# Patient Record
Sex: Male | Born: 1945 | Race: White | Hispanic: No | Marital: Married | Smoking: Never smoker
Health system: Southern US, Community
[De-identification: ages and names within clinical notes are randomized; demographics above are authoritative.]

## PROBLEM LIST (undated history)

## (undated) DIAGNOSIS — I714 Abdominal aortic aneurysm, without rupture, unspecified: Secondary | ICD-10-CM

## (undated) DIAGNOSIS — I459 Conduction disorder, unspecified: Secondary | ICD-10-CM

## (undated) DIAGNOSIS — I10 Essential (primary) hypertension: Secondary | ICD-10-CM

## (undated) DIAGNOSIS — I6521 Occlusion and stenosis of right carotid artery: Principal | ICD-10-CM

## (undated) DIAGNOSIS — M1990 Unspecified osteoarthritis, unspecified site: Secondary | ICD-10-CM

## (undated) DIAGNOSIS — I6529 Occlusion and stenosis of unspecified carotid artery: Secondary | ICD-10-CM

## (undated) DIAGNOSIS — E7800 Pure hypercholesterolemia, unspecified: Secondary | ICD-10-CM

## (undated) DIAGNOSIS — E119 Type 2 diabetes mellitus without complications: Secondary | ICD-10-CM

## (undated) DIAGNOSIS — I350 Nonrheumatic aortic (valve) stenosis: Secondary | ICD-10-CM

## (undated) HISTORY — DX: Abdominal aortic aneurysm, without rupture, unspecified: I71.40

## (undated) HISTORY — DX: Occlusion and stenosis of right carotid artery: I65.21

## (undated) HISTORY — PX: KNEE ARTHROSCOPY: SHX127

## (undated) HISTORY — DX: Nonrheumatic aortic (valve) stenosis: I35.0

## (undated) HISTORY — PX: CATARACT EXTRACTION: SUR2

## (undated) HISTORY — DX: Occlusion and stenosis of unspecified carotid artery: I65.29

## (undated) HISTORY — DX: Abdominal aortic aneurysm, without rupture: I71.4

## (undated) HISTORY — DX: Conduction disorder, unspecified: I45.9

---

## 1989-04-08 HISTORY — PX: KNEE ARTHROSCOPY: SHX127

## 2000-01-25 ENCOUNTER — Encounter: Payer: Self-pay | Admitting: Emergency Medicine

## 2000-01-25 ENCOUNTER — Inpatient Hospital Stay (HOSPITAL_COMMUNITY): Admission: EM | Admit: 2000-01-25 | Discharge: 2000-01-26 | Payer: Self-pay | Admitting: Emergency Medicine

## 2000-01-26 ENCOUNTER — Encounter: Payer: Self-pay | Admitting: Cardiology

## 2013-02-22 ENCOUNTER — Emergency Department (HOSPITAL_COMMUNITY): Payer: Medicare PPO

## 2013-02-22 ENCOUNTER — Encounter (HOSPITAL_COMMUNITY): Payer: Self-pay | Admitting: *Deleted

## 2013-02-22 ENCOUNTER — Observation Stay (HOSPITAL_COMMUNITY)
Admission: EM | Admit: 2013-02-22 | Discharge: 2013-02-23 | Disposition: A | Discharge status: Home / Self Care | Payer: Medicare PPO | Attending: Internal Medicine | Admitting: Internal Medicine

## 2013-02-22 DIAGNOSIS — E119 Type 2 diabetes mellitus without complications: Secondary | ICD-10-CM | POA: Insufficient documentation

## 2013-02-22 DIAGNOSIS — E1169 Type 2 diabetes mellitus with other specified complication: Secondary | ICD-10-CM | POA: Diagnosis present

## 2013-02-22 DIAGNOSIS — E785 Hyperlipidemia, unspecified: Secondary | ICD-10-CM

## 2013-02-22 DIAGNOSIS — E1159 Type 2 diabetes mellitus with other circulatory complications: Secondary | ICD-10-CM | POA: Diagnosis present

## 2013-02-22 DIAGNOSIS — Z79899 Other long term (current) drug therapy: Secondary | ICD-10-CM | POA: Insufficient documentation

## 2013-02-22 DIAGNOSIS — R079 Chest pain, unspecified: Principal | ICD-10-CM | POA: Insufficient documentation

## 2013-02-22 DIAGNOSIS — I10 Essential (primary) hypertension: Secondary | ICD-10-CM | POA: Insufficient documentation

## 2013-02-22 HISTORY — DX: Type 2 diabetes mellitus without complications: E11.9

## 2013-02-22 HISTORY — DX: Essential (primary) hypertension: I10

## 2013-02-22 HISTORY — DX: Pure hypercholesterolemia, unspecified: E78.00

## 2013-02-22 LAB — LIPID PANEL
HDL: 30 mg/dL — ABNORMAL LOW (ref >39)
LDL Cholesterol: 63 mg/dL (ref 0–99)
Triglycerides: 133 mg/dL (ref <150)
VLDL: 27 mg/dL (ref 0–40)

## 2013-02-22 LAB — BASIC METABOLIC PANEL
Chloride: 104 mEq/L (ref 96–112)
GFR calc Af Amer: >90 mL/min (ref >90)
GFR calc non Af Amer: >90 mL/min (ref >90)
Potassium: 3.4 mEq/L — ABNORMAL LOW (ref 3.5–5.1)
Sodium: 140 mEq/L (ref 135–145)

## 2013-02-22 LAB — COMPREHENSIVE METABOLIC PANEL
ALT: 27 U/L (ref 0–53)
AST: 21 U/L (ref 0–37)
CO2: 29 mEq/L (ref 19–32)
Calcium: 9.7 mg/dL (ref 8.4–10.5)
Chloride: 101 mEq/L (ref 96–112)
Creatinine, Ser: 0.63 mg/dL (ref 0.50–1.35)
GFR calc Af Amer: >90 mL/min (ref >90)
GFR calc non Af Amer: >90 mL/min (ref >90)
Glucose, Bld: 161 mg/dL — ABNORMAL HIGH (ref 70–99)
Sodium: 139 mEq/L (ref 135–145)
Total Bilirubin: 0.8 mg/dL (ref 0.3–1.2)

## 2013-02-22 LAB — CBC
Hemoglobin: 16.7 g/dL (ref 13.0–17.0)
MCH: 30.1 pg (ref 26.0–34.0)
MCV: 80.9 fL (ref 78.0–100.0)
Platelets: 133 10*3/uL — ABNORMAL LOW (ref 150–400)
Platelets: 139 10*3/uL — ABNORMAL LOW (ref 150–400)
RBC: 5.55 MIL/uL (ref 4.22–5.81)
RDW: 13.1 % (ref 11.5–15.5)
WBC: 5.5 10*3/uL (ref 4.0–10.5)
WBC: 7.3 10*3/uL (ref 4.0–10.5)

## 2013-02-22 LAB — POCT I-STAT TROPONIN I
Troponin i, poc: 0 ng/mL (ref 0.00–0.08)
Troponin i, poc: 0 ng/mL (ref 0.00–0.08)

## 2013-02-22 LAB — GLUCOSE, CAPILLARY: Glucose-Capillary: 181 mg/dL — ABNORMAL HIGH (ref 70–99)

## 2013-02-22 LAB — HEMOGLOBIN A1C: Hgb A1c MFr Bld: 6.8 % — ABNORMAL HIGH (ref <5.7)

## 2013-02-22 MED ORDER — ASPIRIN 81 MG PO CHEW
162.0000 mg | CHEWABLE_TABLET | Freq: Once | ORAL | Status: AC
  Administered 2013-02-22: 162 mg via ORAL
  Filled 2013-02-22: qty 2

## 2013-02-22 MED ORDER — INSULIN ASPART 100 UNIT/ML ~~LOC~~ SOLN
0.0000 [IU] | SUBCUTANEOUS | Status: DC
  Administered 2013-02-22: 2 [IU] via SUBCUTANEOUS

## 2013-02-22 MED ORDER — GLIMEPIRIDE 1 MG PO TABS
1.0000 mg | ORAL_TABLET | Freq: Every day | ORAL | Status: DC
  Administered 2013-02-23: 1 mg via ORAL
  Filled 2013-02-22 (×2): qty 1

## 2013-02-22 MED ORDER — ASPIRIN EC 81 MG PO TBEC
81.0000 mg | DELAYED_RELEASE_TABLET | Freq: Once | ORAL | Status: DC
  Filled 2013-02-22: qty 1

## 2013-02-22 MED ORDER — ENOXAPARIN SODIUM 40 MG/0.4ML ~~LOC~~ SOLN
40.0000 mg | SUBCUTANEOUS | Status: DC
  Administered 2013-02-22: 40 mg via SUBCUTANEOUS
  Filled 2013-02-22 (×2): qty 0.4

## 2013-02-22 MED ORDER — HYDROCHLOROTHIAZIDE 12.5 MG PO CAPS
12.5000 mg | ORAL_CAPSULE | Freq: Every day | ORAL | Status: DC
  Administered 2013-02-23: 12.5 mg via ORAL
  Filled 2013-02-22: qty 1

## 2013-02-22 MED ORDER — ASPIRIN EC 325 MG PO TBEC
325.0000 mg | DELAYED_RELEASE_TABLET | Freq: Every day | ORAL | Status: DC
  Administered 2013-02-23: 325 mg via ORAL
  Filled 2013-02-22: qty 1

## 2013-02-22 MED ORDER — SITAGLIPTIN PHOSPHATE 50 MG PO TABS
50.0000 mg | ORAL_TABLET | Freq: Two times a day (BID) | ORAL | Status: DC
  Administered 2013-02-23: 50 mg via ORAL
  Filled 2013-02-22 (×3): qty 1

## 2013-02-22 MED ORDER — SIMVASTATIN 10 MG PO TABS
10.0000 mg | ORAL_TABLET | Freq: Every day | ORAL | Status: DC
  Administered 2013-02-22: 10 mg via ORAL
  Filled 2013-02-22 (×2): qty 1

## 2013-02-22 MED ORDER — AMLODIPINE BESYLATE 10 MG PO TABS
10.0000 mg | ORAL_TABLET | Freq: Every day | ORAL | Status: DC
  Administered 2013-02-23: 10 mg via ORAL
  Filled 2013-02-22: qty 1

## 2013-02-22 MED ORDER — RAMIPRIL 10 MG PO TABS: 10.0000 mg | ORAL_TABLET | Freq: Every day | ORAL | Status: DC

## 2013-02-22 MED ORDER — SITAGLIPTIN PHOS-METFORMIN HCL 50-500 MG PO TABS
1.0000 | ORAL_TABLET | Freq: Two times a day (BID) | ORAL | Status: DC
  Filled 2013-02-22: qty 1

## 2013-02-22 MED ORDER — CALCIUM CARBONATE 600 MG PO TABS
600.0000 mg | ORAL_TABLET | Freq: Two times a day (BID) | ORAL | Status: DC
  Filled 2013-02-22: qty 1

## 2013-02-22 MED ORDER — CALCIUM CARBONATE 1250 (500 CA) MG PO TABS
1.0000 | ORAL_TABLET | Freq: Two times a day (BID) | ORAL | Status: DC
  Administered 2013-02-23: 500 mg via ORAL
  Filled 2013-02-22 (×3): qty 1

## 2013-02-22 MED ORDER — INSULIN ASPART 100 UNIT/ML ~~LOC~~ SOLN
0.0000 [IU] | Freq: Three times a day (TID) | SUBCUTANEOUS | Status: DC
Start: 2013-02-23 — End: 2013-02-23
  Administered 2013-02-23: 1 [IU] via SUBCUTANEOUS

## 2013-02-22 MED ORDER — RAMIPRIL 10 MG PO TABS
10.0000 mg | ORAL_TABLET | Freq: Two times a day (BID) | ORAL | Status: DC
  Administered 2013-02-22 – 2013-02-23 (×2): 10 mg via ORAL
  Filled 2013-02-22 (×3): qty 1

## 2013-02-22 MED ORDER — ASPIRIN EC 81 MG PO TBEC
81.0000 mg | DELAYED_RELEASE_TABLET | Freq: Every day | ORAL | Status: DC
  Filled 2013-02-22: qty 1

## 2013-02-22 MED ORDER — METFORMIN HCL 500 MG PO TABS
500.0000 mg | ORAL_TABLET | Freq: Two times a day (BID) | ORAL | Status: DC
  Administered 2013-02-23: 500 mg via ORAL
  Filled 2013-02-22 (×3): qty 1

## 2013-02-22 NOTE — Consult Note (Signed)
CARDIOLOGY CONSULT NOTE  Patient ID: Christopher Rasmussen MRN: [id number] DOB/AGE: 12-23-1945 67 y.o.  Admit date: 02/22/2013 Referring Physician   Primary Physician:  Aida Puffer, MD Reason for Consultation  Chest pain  HPI: Patient is a pleasant 67 year old Caucasian male with history of diabetes, hypertension, hyperlipidemia, who I have never seen in the past, however he was referred to me for a routine treadmill exercise stress test about 6 months ago, in which it was low risk, he also has had known carotid stenosis per carotid duplex. The carotid stenosis is moderate in the range of 50-69%. He had been doing well until this morning he started to notice pain below his left rib cage. It was described as mild, would worsen when he takes a deep breath. There was no other associated symptoms of nausea, vomiting, diaphoresis. No associated shortness breath. No history to suggest PND orthopnea. No history of lower extension edema or painful swelling of the lower extremity is, dizziness or hemoptysis or syncope. He denies symptoms to suggest claudication. His wife is present at the bedside.  Past Medical History  Diagnosis Date  . Diabetes mellitus without complication   . Hypertension   . High cholesterol      History reviewed. No pertinent family history. there is no history of abdominal aortic aneurysm, no history of premature coronary artery disease in the family.  Social History: History   Social History  . Marital Status: Married    Spouse Name: N/A    Number of Children: N/A  . Years of Education: N/A   Occupational History  . Not on file.   Social History Main Topics  . Smoking status: Never Smoker   . Smokeless tobacco: Not on file  . Alcohol Use: Not on file  . Drug Use: No  . Sexually Active: Not on file   Other Topics Concern  . Not on file   Social History Narrative  . No narrative on file     Prescriptions prior to admission  Medication Sig Dispense Refill  .  amLODipine (NORVASC) 10 MG tablet Take 10 mg by mouth daily.      Marland Kitchen aspirin 325 MG tablet Take 325 mg by mouth daily.      . calcium carbonate (OS-CAL) 600 MG TABS Take 600 mg by mouth 2 (two) times daily with a meal.      . glimepiride (AMARYL) 1 MG tablet Take 1 mg by mouth daily before breakfast.      . hydrochlorothiazide (MICROZIDE) 12.5 MG capsule Take 12.5 mg by mouth daily.      . ramipril (ALTACE) 10 MG tablet Take 10 mg by mouth daily.      . simvastatin (ZOCOR) 10 MG tablet Take 10 mg by mouth at bedtime.      . sitaGLIPtan-metformin (JANUMET) 50-500 MG per tablet Take 1 tablet by mouth 2 (two) times daily with a meal.      . [DISCONTINUED] ferrous gluconate (FERGON) 325 MG tablet Take 325 mg by mouth daily with breakfast.      . [DISCONTINUED] Naproxen Sodium (ALEVE PO) Take 2 tablets by mouth daily as needed (pain).        Scheduled Meds: . [START ON 02/23/2013] amLODipine  10 mg Oral Daily  . [START ON 02/23/2013] aspirin EC  325 mg Oral Daily  . [START ON 02/23/2013] calcium carbonate  1 tablet Oral BID WC  . enoxaparin (LOVENOX) injection  40 mg Subcutaneous Q24H  . [START ON 02/23/2013] glimepiride  1  mg Oral QAC breakfast  . [START ON 02/23/2013] hydrochlorothiazide  12.5 mg Oral Daily  . insulin aspart  0-9 Units Subcutaneous Q4H  . [START ON 02/23/2013] metFORMIN  500 mg Oral BID WC  . [START ON 02/23/2013] ramipril  10 mg Oral Daily  . simvastatin  10 mg Oral QHS  . [START ON 02/23/2013] sitaGLIPtin  50 mg Oral BID WC   Continuous Infusions:  PRN Meds:.  ROS: General: no fevers/chills/night sweats Eyes: no blurry vision, diplopia, or amaurosis ENT: no sore throat or hearing loss Resp: no cough, wheezing, or hemoptysis CV: no edema or palpitations GI: no abdominal pain, nausea, vomiting, diarrhea, or constipation GU: no dysuria, frequency, or hematuria Skin: no rash Neuro: no headache, numbness, tingling, or weakness of extremities Musculoskeletal: no joint pain or  swelling Heme: no bleeding, DVT, or easy bruising Endo: no polydipsia or polyuria    Physical Exam: Blood pressure 150/72, pulse 78, temperature 98.5 F (36.9 C), temperature source Oral, resp. rate 14, SpO2 96.00%.   General appearance: alert, cooperative, appears stated age, no distress and moderately obese Lungs: clear to auscultation bilaterally Chest wall: no tenderness Heart: S1 and S2 is normal. There is a 2/6 rough crescendo decrescendo systolic murmur in the apex and right sternal border. Abdomen: soft, non-tender; bowel sounds normal; no masses,  no organomegaly and Pannus present. Extremities: extremities normal, atraumatic, no cyanosis or edema Pulses: 2+ and symmetric there is bilateral carotid bruit. Neurologic: Grossly normal  Labs:   Lab Results  Component Value Date   WBC 5.5 02/22/2013   HGB 17.2* 02/22/2013   HCT 44.7 02/22/2013   MCV 81.3 02/22/2013   PLT 133* 02/22/2013    Recent Labs Lab 02/22/13 1134  NA 140  K 3.4*  CL 104  CO2 27  BUN 11  CREATININE 0.67  CALCIUM 9.7  GLUCOSE 136*   Lab Results  Component Value Date   TROPONINI <0.30 02/22/2013    Lipid Panel  No results found for this basename: chol, trig, hdl, cholhdl, vldl, ldlcalc    EKG: normal EKG, normal sinus rhythm, unchanged from previous tracings.    Radiology: Dg Chest 2 View  02/22/2013   *RADIOLOGY REPORT*  Clinical Data: Chest pain, history of hypertension and diabetes  CHEST - 2 VIEW  Comparison: None.  Findings:  Borderline enlarged cardiac silhouette and mediastinal contours possibly accentuated due to decreased lung volumes.  Minimal heterogeneous opacities within the medial aspect of the bilateral lungs, favored to represent atelectasis.  No focal airspace opacity.  No pleural effusion or pneumothorax.  No definite evidence of edema.  No acute osseous abnormalities.  IMPRESSION: Minimal perihilar atelectasis without acute cardiopulmonary disease.   Original Report  Authenticated By: Tacey Ruiz, MD    ASSESSMENT AND PLAN:  1. Musculoskeletal chest pain in a patient with multiple cardiovascular risk factors. Patient is extremely concerned due to his age and risk factors that he needed to be evaluated. 2. Hypertension 3. Hyperlipidemia 4. Diabetes mellitus type 2 controlled 5. Carotid artery stenosis of 50-69% outpatient carotid duplex done 3 months ago. 6. Abnormal heart sounds, suggest mild aortic stenosis.  Recommendation: Patient should be kept in for rule out of myocardial infarction/unstable angina although symptoms appear to be atypical. He is presently completely asymptomatic. I have advised him to walk around in the hallway and if no chest pain, cardiac markers were negative microinjury, he can be discharged home in the morning. I will contact him to schedule and see me  in the outpatient basis. I will set him up for nuclear stress test to improve the sensitivity and specificity of the stress test and also obtain outpatient echocardiogram. He'll need aggressive risk factor modification. I will check his lipid profile.  Pamella Pert, MD 02/22/2013, 6:22 PM [facility] Pager: [phone] Office: [phone] If no answer Cell [phone]

## 2013-02-22 NOTE — ED Provider Notes (Signed)
History    CSN: [id number] Arrival date & time 02/22/13  1034  First MD Initiated Contact with Patient 02/22/13 1145     Chief Complaint  Patient presents with  . Chest Pain    (Consider location/radiation/quality/duration/timing/severity/associated sxs/prior Treatment) HPI Comments: Mr. Helt is a 67 yo male with a PMH of HTN, HLD and DM here with complaint of L sided CP below his rib cage that began upon awaking this morning.  He says the pain is 1/10 and occurs with exhalation.  He denies chest pressure/tightness/SOB, radiation, diaphoresis or N/V.  He has no history of GERD or PUD and he denies trauma to his left side/chest.  He denies lower extremity edema.  He took two Aleve prior to coming to the ED and feels the pain has improved.  He also took his daily aspirin this morning.  He says he is compliant with his medications.  His family history is significant for MI in both his father and brother (father died at 72 from MI; brother with MI at age 67).  He reports having had a normal stress test four months ago.  Patient is a 67 y.o. male presenting with chest pain. The history is provided by the patient and the spouse. No language interpreter was used.  Chest Pain Pain location:  L lateral chest Pain quality: no pressure, not radiating and no tightness   Pain radiates to:  Does not radiate Pain radiates to the back: no   Pain severity:  Mild Timing:  Intermittent Progression:  Improving Chronicity:  New Context: breathing   Context: no drug use, not eating and no trauma   Relieved by:  Aspirin (and Aleve) Worsened by:  Deep breathing Associated symptoms: no abdominal pain, no cough, no diaphoresis, no fatigue, no fever, no headache, no lower extremity edema, no nausea, no shortness of breath and not vomiting   Risk factors: diabetes mellitus, hypertension and male sex   Risk factors: no smoking    Past Medical History  Diagnosis Date  . Diabetes mellitus without complication    . Hypertension   . High cholesterol    History reviewed. No pertinent past surgical history. History reviewed. No pertinent family history. History  Substance Use Topics  . Smoking status: Not on file  . Smokeless tobacco: Not on file  . Alcohol Use: No    Review of Systems  Constitutional: Negative for fever, diaphoresis and fatigue.  HENT: Negative for rhinorrhea and sneezing.   Eyes: Negative for visual disturbance.  Respiratory: Negative for cough, chest tightness and shortness of breath.   Cardiovascular: Positive for chest pain. Negative for leg swelling.  Gastrointestinal: Negative for nausea, vomiting and abdominal pain.  Endocrine: Negative for polyuria.  Genitourinary: Negative for dysuria.  Allergic/Immunologic: Negative for food allergies.  Neurological: Negative for headaches.    Allergies  Review of patient's allergies indicates no known allergies.  Home Medications   Current Outpatient Rx  Name  Route  Sig  Dispense  Refill  . amLODipine (NORVASC) 10 MG tablet   Oral   Take 10 mg by mouth daily.         Marland Kitchen aspirin 325 MG tablet   Oral   Take 325 mg by mouth daily.         . calcium carbonate (OS-CAL) 600 MG TABS   Oral   Take 600 mg by mouth 2 (two) times daily with a meal.         . ferrous gluconate (FERGON)  325 MG tablet   Oral   Take 325 mg by mouth daily with breakfast.         . glimepiride (AMARYL) 1 MG tablet   Oral   Take 1 mg by mouth daily before breakfast.         . hydrochlorothiazide (MICROZIDE) 12.5 MG capsule   Oral   Take 12.5 mg by mouth daily.         . Naproxen Sodium (ALEVE PO)   Oral   Take 2 tablets by mouth daily as needed (pain).         . ramipril (ALTACE) 10 MG tablet   Oral   Take 10 mg by mouth daily.         . simvastatin (ZOCOR) 10 MG tablet   Oral   Take 10 mg by mouth at bedtime.         . sitaGLIPtan-metformin (JANUMET) 50-500 MG per tablet   Oral   Take 1 tablet by mouth 2 (two)  times daily with a meal.          BP 181/73  Pulse 87  Temp(Src) 98.5 F (36.9 C) (Oral)  Resp 18  SpO2 96% Physical Exam  Constitutional: He is oriented to person, place, and time. He appears well-developed and well-nourished. No distress.  HENT:  Head: Normocephalic and atraumatic.  Mouth/Throat: Oropharynx is clear and moist.  Eyes: Conjunctivae and EOM are normal. Pupils are equal, round, and reactive to light. No scleral icterus.  Neck: Neck supple.  Cardiovascular: Normal rate, regular rhythm and normal heart sounds.  Exam reveals no gallop and no friction rub.   No murmur heard. Pulmonary/Chest: Effort normal and breath sounds normal. No respiratory distress. He has no wheezes. He has no rales.  Abdominal: Soft. Bowel sounds are normal. He exhibits no distension and no mass. There is no tenderness. There is no rebound and no guarding.  Musculoskeletal: Normal range of motion. He exhibits no edema and no tenderness.  Lymphadenopathy:    He has no cervical adenopathy.  Neurological: He is alert and oriented to person, place, and time. No cranial nerve deficit.  Skin: Skin is warm. He is not diaphoretic.  Psychiatric: He has a normal mood and affect. His behavior is normal.    ED Course  Procedures (including critical care time) Labs Reviewed  CBC  BASIC METABOLIC PANEL     Date: 02/22/2013  Rate: 84 bpm  Rhythm: normal sinus rhythm  QRS Axis: normal   Intervals: normal  ST/T Wave abnormalities: normal  Conduction Disutrbances:none  Narrative Interpretation:   Old EKG Reviewed: none available     Dg Chest 2 View  02/22/2013   *RADIOLOGY REPORT*  Clinical Data: Chest pain, history of hypertension and diabetes  CHEST - 2 VIEW  Comparison: None.  Findings:  Borderline enlarged cardiac silhouette and mediastinal contours possibly accentuated due to decreased lung volumes.  Minimal heterogeneous opacities within the medial aspect of the bilateral lungs, favored to  represent atelectasis.  No focal airspace opacity.  No pleural effusion or pneumothorax.  No definite evidence of edema.  No acute osseous abnormalities.  IMPRESSION: Minimal perihilar atelectasis without acute cardiopulmonary disease.   Original Report Authenticated By: Tacey Ruiz, MD   No diagnosis found.  MDM   Pt with resolving CP and  no evidence of ischemia on EKG.  Pt with no history of chest trauma or thoracic surgery.  Doubt PE as patient without SOB/tachycardia, without lower extremity edema.  Doubt PNA as pt without recent cough/URI symptoms.  Given his risk factors for CAD will admit and trend trops.  Istat trop negative.  ASA 162mg  given in ED.  Internal medicine consulted for admission.         Evelena Peat, DO 02/22/13 [phone]

## 2013-02-22 NOTE — ED Provider Notes (Signed)
History/physical exam/procedure(s) were performed by non-physician practitioner and as supervising physician I was immediately available for consultation/collaboration. I have reviewed all notes and am in agreement with care and plan.   Hilario Quarry, MD 02/22/13 [phone]

## 2013-02-22 NOTE — ED Notes (Signed)
Pt reports pain to left chest since this am, pain increases with breathing and movement, denies recent cough. No acute distress noted at triage, ekg being done.

## 2013-02-22 NOTE — H&P (Signed)
[facility] History and Physical  Christopher Rasmussen HQI:[id number] DOB: 14-Dec-1945 DOA: 02/22/2013  Referring physician:  PCP: Aida Puffer, MD  Specialists:   Chief Complaint: chest Pain  HPI: Christopher Rasmussen 67 yo       male with a PMH of HTN, HLD and DM here with complaint of L sided CP below his rib cage that began upon awaking this morning. He says the pain is 1/10 and occurs with exhalation. He denies chest pressure/tightness/SOB, radiation, diaphoresis or N/V. He has no history of GERD or PUD and he denies trauma to his left side/chest. He denies lower extremity edema. He took two Aleve prior to coming to the ED and feels the pain has improved. He also took his daily aspirin this morning. He says he is compliant with his medications. His family history is significant for MI in both his father and brother (father died at 60 from MI; brother with MI at age 41). He reports having had a normal stress test four months ago.  Heart Score of at least 4 admit for serial CE   Review of Systems: The patient denies anorexia, fever, weight loss,, vision loss, decreased hearing, hoarseness, chest pain, syncope, dyspnea on exertion, peripheral edema, balance deficits, hemoptysis, abdominal pain, melena, hematochezia, severe indigestion/heartburn, hematuria, incontinence, genital sores, muscle weakness, suspicious skin lesions, transient blindness, difficulty walking, depression, unusual weight change, abnormal bleeding, enlarged lymph nodes, angioedema, and breast masses.    Past Medical History  Diagnosis Date  . Diabetes mellitus without complication   . Hypertension   . High cholesterol    History reviewed. No pertinent past surgical history. Social History:  reports that he has never smoked. He does not have any smokeless tobacco history on file. He reports that he does not use illicit drugs. His alcohol history is not on file.  No Known Allergies  History reviewed. No pertinent family  history.   Prior to Admission medications   Medication Sig Start Date End Date Taking? Authorizing Provider  amLODipine (NORVASC) 10 MG tablet Take 10 mg by mouth daily.   Yes Historical Provider, MD  aspirin 325 MG tablet Take 325 mg by mouth daily.   Yes Historical Provider, MD  calcium carbonate (OS-CAL) 600 MG TABS Take 600 mg by mouth 2 (two) times daily with a meal.   Yes Historical Provider, MD  ferrous gluconate (FERGON) 325 MG tablet Take 325 mg by mouth daily with breakfast.   Yes Historical Provider, MD  glimepiride (AMARYL) 1 MG tablet Take 1 mg by mouth daily before breakfast.   Yes Historical Provider, MD  hydrochlorothiazide (MICROZIDE) 12.5 MG capsule Take 12.5 mg by mouth daily.   Yes Historical Provider, MD  Naproxen Sodium (ALEVE PO) Take 2 tablets by mouth daily as needed (pain).   Yes Historical Provider, MD  ramipril (ALTACE) 10 MG tablet Take 10 mg by mouth daily.   Yes Historical Provider, MD  simvastatin (ZOCOR) 10 MG tablet Take 10 mg by mouth at bedtime.   Yes Historical Provider, MD  sitaGLIPtan-metformin (JANUMET) 50-500 MG per tablet Take 1 tablet by mouth 2 (two) times daily with a meal.   Yes Historical Provider, MD   Physical Exam: Filed Vitals:   02/22/13 1230 02/22/13 1300 02/22/13 1530 02/22/13 1600  BP: 134/66 148/70 140/67 150/72  Pulse: 81 80 78 78  Temp:      TempSrc:      Resp: 16 16 14 14   SpO2: 97% 97% 96% 96%  General:  Alert, NAD  Cardiovascular: Regular in rate, negative murmurs rubs gallops, DP PT pulse 2+ bilateral  Respiratory: Clear to auscultation bilateral  Abdomen: Soft, nontender, nondistended, plus bowel sounds  Labs on Admission:  Basic Metabolic Panel:  Recent Labs Lab 02/22/13 1134  NA 140  K 3.4*  CL 104  CO2 27  GLUCOSE 136*  BUN 11  CREATININE 0.67  CALCIUM 9.7   Liver Function Tests: No results found for this basename: AST, ALT, ALKPHOS, BILITOT, PROT, ALBUMIN,  in the last 168 hours No results found  for this basename: LIPASE, AMYLASE,  in the last 168 hours No results found for this basename: AMMONIA,  in the last 168 hours CBC:  Recent Labs Lab 02/22/13 1134  WBC 5.5  HGB 17.2*  HCT 44.7  MCV 81.3  PLT 133*   Cardiac Enzymes:  Recent Labs Lab 02/22/13 1540  TROPONINI <0.30    BNP (last 3 results) No results found for this basename: PROBNP,  in the last 8760 hours CBG: No results found for this basename: GLUCAP,  in the last 168 hours  Radiological Exams on Admission: Dg Chest 2 View  02/22/2013   *RADIOLOGY REPORT*  Clinical Data: Chest pain, history of hypertension and diabetes  CHEST - 2 VIEW  Comparison: None.  Findings:  Borderline enlarged cardiac silhouette and mediastinal contours possibly accentuated due to decreased lung volumes.  Minimal heterogeneous opacities within the medial aspect of the bilateral lungs, favored to represent atelectasis.  No focal airspace opacity.  No pleural effusion or pneumothorax.  No definite evidence of edema.  No acute osseous abnormalities.  IMPRESSION: Minimal perihilar atelectasis without acute cardiopulmonary disease.   Original Report Authenticated By: Tacey Ruiz, MD    EKG: No previous EKG for comparison normal sinus rhythm unremarkable EKG   Assessment/Plan Active Problems:   * No active hospital problems. *    1. CP; Resolved 2 x CE negative 2. DM; Last reported HgA1C=7.3 counseled not w/i ADA guidelines  (Goal HgA1C< 7.0). Will allow PCP to make adjustments.  3. HTN; Not w/i ADA guidelines; Goal BP < 130/80 Inc Ramipril 10 mg to BID 4. HLD; obtain Lipid Panel) Time spent: 60 minute  Christopher Rasmussen, Roselind Messier [facility] Pager [phone]  If 7PM-7AM, please contact night-coverage [email or web address] Password [facility] 02/22/2013, 5:07 PM

## 2013-02-22 NOTE — Progress Notes (Signed)
Informed Dr. Joseph Art of pts arrival

## 2013-02-23 LAB — LIPID PANEL
Cholesterol: 111 mg/dL (ref 0–200)
LDL Cholesterol: 31 mg/dL (ref 0–99)
Total CHOL/HDL Ratio: 4.1 RATIO
Triglycerides: 266 mg/dL — ABNORMAL HIGH (ref <150)
VLDL: 53 mg/dL — ABNORMAL HIGH (ref 0–40)

## 2013-02-23 MED ORDER — RAMIPRIL 10 MG PO TABS: 10.0000 mg | ORAL_TABLET | Freq: Two times a day (BID) | ORAL | Status: DC

## 2013-02-23 NOTE — Discharge Summary (Signed)
Physician Discharge Summary  Christopher Rasmussen:[id number] DOB: June 18, 1946 DOA: 02/22/2013  PCP: Aida Puffer, MD  Admit date: 02/22/2013 Discharge date: 02/23/2013  Time spent: 30 minutes  Recommendations for Outpatient Follow-up:  1. CP; Resolved 3 x CE negative  2. DM; Last reported HgA1C=7.3 counseled not w/i ADA guidelines  (Goal HgA1C< 7.0). Will allow PCP to make adjustments.  3. HTN; Not w/i ADA guidelines; Goal BP < 130/80 Inc Ramipril 10 mg to BID  4. HLD; patient's cholesterol within ADA guidelines no changes to medication requiring    Discharge Diagnoses:  Principal Problem:   Chest pain Active Problems:   HTN (hypertension)   HLD (hyperlipidemia)   Diabetes   Discharge Condition: Stable  Diet recommendation: Diabetic, heart healthy  There were no vitals filed for this visit.  History of present illness:  Christopher Rasmussen 67 yo male with a PMH of HTN, HLD and DM here with complaint of L sided CP below his rib cage that began upon awaking this morning. He says the pain is 1/10 and occurs with exhalation. He denies chest pressure/tightness/SOB, radiation, diaphoresis or N/V. He has no history of GERD or PUD and he denies trauma to his left side/chest. He denies lower extremity edema. He took two Aleve prior to coming to the ED and feels the pain has improved. He also took his daily aspirin this morning. He says he is compliant with his medications. His family history is significant for MI in both his father and brother (father died at 95 from MI; brother with MI at age 71). He reports having had a normal stress test four months ago. TODAY patient slept well negative chest pain, negative shortness of breath, ready for discharge. Patient counseled that we increased his blood pressure medication to twice a day.   Heart Score of at least 4 admit for serial CE  Consultations:  Cardiology  Discharge Exam: Filed Vitals:   02/22/13 1300 02/22/13 1530 02/22/13 1600 02/22/13 2047   BP: 148/70 140/67 150/72 139/65  Pulse: 80 78 78 78  Temp:    98.2 F (36.8 C)  TempSrc:    Oral  Resp: 16 14 14    SpO2: 97% 96% 96% 96%   General: Alert, NAD  Cardiovascular: Regular in rate, negative murmurs rubs gallops, DP PT pulse 2+ bilateral  Respiratory: Clear to auscultation bilateral  Abdomen: Soft, nontender, nondistended, plus bowel sounds Discharge Instructions     Medication List    ASK your doctor about these medications       ALEVE PO  Take 2 tablets by mouth daily as needed (pain).     amLODipine 10 MG tablet  Commonly known as:  NORVASC  Take 10 mg by mouth daily.     aspirin 325 MG tablet  Take 325 mg by mouth daily.     calcium carbonate 600 MG Tabs  Commonly known as:  OS-CAL  Take 600 mg by mouth 2 (two) times daily with a meal.     ferrous gluconate 325 MG tablet  Commonly known as:  FERGON  Take 325 mg by mouth daily with breakfast.     glimepiride 1 MG tablet  Commonly known as:  AMARYL  Take 1 mg by mouth daily before breakfast.     hydrochlorothiazide 12.5 MG capsule  Commonly known as:  MICROZIDE  Take 12.5 mg by mouth daily.     ramipril 10 MG tablet  Commonly known as:  ALTACE  Take 10 mg by mouth daily.  simvastatin 10 MG tablet  Commonly known as:  ZOCOR  Take 10 mg by mouth at bedtime.     sitaGLIPtan-metformin 50-500 MG per tablet  Commonly known as:  JANUMET  Take 1 tablet by mouth 2 (two) times daily with a meal.       No Known Allergies    The results of significant diagnostics from this hospitalization (including imaging, microbiology, ancillary and laboratory) are listed below for reference.    Significant Diagnostic Studies: Dg Chest 2 View  02/22/2013   *RADIOLOGY REPORT*  Clinical Data: Chest pain, history of hypertension and diabetes  CHEST - 2 VIEW  Comparison: None.  Findings:  Borderline enlarged cardiac silhouette and mediastinal contours possibly accentuated due to decreased lung volumes.  Minimal  heterogeneous opacities within the medial aspect of the bilateral lungs, favored to represent atelectasis.  No focal airspace opacity.  No pleural effusion or pneumothorax.  No definite evidence of edema.  No acute osseous abnormalities.  IMPRESSION: Minimal perihilar atelectasis without acute cardiopulmonary disease.   Original Report Authenticated By: Tacey Ruiz, MD    Microbiology: No results found for this or any previous visit (from the past 240 hour(s)).   Labs: Basic Metabolic Panel:  Recent Labs Lab 02/22/13 1134 02/22/13 1758  NA 140 139  K 3.4* 3.3*  CL 104 101  CO2 27 29  GLUCOSE 136* 161*  BUN 11 10  CREATININE 0.67 0.63  CALCIUM 9.7 9.7  MG  --  1.8   Liver Function Tests:  Recent Labs Lab 02/22/13 1758  AST 21  ALT 27  ALKPHOS 66  BILITOT 0.8  PROT 7.2  ALBUMIN 4.0   No results found for this basename: LIPASE, AMYLASE,  in the last 168 hours No results found for this basename: AMMONIA,  in the last 168 hours CBC:  Recent Labs Lab 02/22/13 1134 02/22/13 1758  WBC 5.5 7.3  HGB 17.2* 16.7  HCT 44.7 44.9  MCV 81.3 80.9  PLT 133* 139*   Cardiac Enzymes:  Recent Labs Lab 02/22/13 1540 02/22/13 1758  TROPONINI <0.30 <0.30   BNP: BNP (last 3 results) No results found for this basename: PROBNP,  in the last 8760 hours CBG:  Recent Labs Lab 02/22/13 1802 02/22/13 2120  GLUCAP 181* 90       Signed:  Modelle Vollmer, J  [facility] 02/23/2013, 6:20 AM

## 2014-08-06 ENCOUNTER — Other Ambulatory Visit (HOSPITAL_COMMUNITY): Payer: Self-pay | Admitting: Orthopaedic Surgery

## 2014-08-14 NOTE — Patient Instructions (Addendum)
Christopher Rasmussen  08/14/2014   Your procedure is scheduled on: 08/22/14   [facility]  En[facility]ance and follow signs to               Short Stay Center at 8:15 AM.   Call this number if you have problems the morning of surgery [phone]   Remember:  Do not eat food or drink liquids :After Midnight.     Take these medicines the morning of surgery with A SIP OF WATER: AMLODIPINE                               You may not have any metal on your body including hair pins and              piercings  Do not wear jewelry, make-up, lotions, powders or perfumes.             Do not wear nail polish.  Do not shave  48 hours prior to surgery.              Men may shave face and neck.   Do not bring valuables to the hospital. [facility] IS NOT             RESPONSIBLE   FOR VALUABLES.  Contacts, dentures or bridgework may not be worn into surgery.  Leave suitcase in the car. After surgery it may be brought to your room.     Patients discharged the day of surgery will not be allowed to drive home.  Name and phone number of your driver:  Special Instructions: N/A              Please read over the following fact sheets you were given: _____________________________________________________________________                                                     [facility] - PREPARING FOR SURGERY  Before surgery, you can play an important role.  Because skin is not sterile, your skin needs to be as free of germs as possible.  You can reduce the number of germs on your skin by washing with CHG (chlorahexidine gluconate) soap before surgery.  CHG is an antiseptic cleaner which kills germs and bonds with the skin to continue killing germs even after washing. Please DO NOT use if you have an allergy to CHG or antibacterial soaps.  If your skin becomes reddened/irritated stop using the CHG and inform your nurse when you arrive at Short Stay. Do not shave (including legs  and underarms) for at least 48 hours prior to the first CHG shower.  You may shave your face. Please follow these instructions carefully:   1.  Shower with CHG Soap the night before surgery and the  morning of Surgery.   2.  If you choose to wash your hair, wash your hair first as usual with your  normal  Shampoo.   3.  After you shampoo, rinse your hair and body thoroughly to remove the  shampoo.  4.  Use CHG as you would any other liquid soap.  You can apply chg directly  to the skin and wash . Gently wash with scrungie or clean wascloth    5.  Apply the CHG Soap to your body ONLY FROM THE NECK DOWN.   Do not use on open                           Wound or open sores. Avoid contact with eyes, ears mouth and genitals (private parts).                        Genitals (private parts) with your normal soap.              6.  Wash thoroughly, paying special attention to the area where your surgery  will be performed.   7.  Thoroughly rinse your body with warm water from the neck down.   8.  DO NOT shower/wash with your normal soap after using and rinsing off  the CHG Soap .                9.  Pat yourself dry with a clean towel.             10.  Wear clean pajamas.             11.  Place clean sheets on your bed the night of your first shower and do not  sleep with pets.  Day of Surgery : Do not apply any lotions/deodorants the morning of surgery.  Please wear clean clothes to the hospital/surgery center.  FAILURE TO FOLLOW THESE INSTRUCTIONS MAY RESULT IN THE CANCELLATION OF YOUR SURGERY    PATIENT SIGNATURE_________________________________  ______________________________________________________________________

## 2014-08-15 ENCOUNTER — Encounter (HOSPITAL_COMMUNITY): Payer: Self-pay

## 2014-08-15 ENCOUNTER — Ambulatory Visit (HOSPITAL_COMMUNITY)
Admission: RE | Admit: 2014-08-15 | Discharge: 2014-08-15 | Disposition: A | Discharge status: Home / Self Care | Payer: Medicare PPO | Source: Ambulatory Visit | Attending: Anesthesiology | Admitting: Anesthesiology

## 2014-08-15 ENCOUNTER — Encounter (HOSPITAL_COMMUNITY)
Admission: RE | Admit: 2014-08-15 | Discharge: 2014-08-15 | Disposition: A | Discharge status: Home / Self Care | Payer: Medicare PPO | Source: Ambulatory Visit | Attending: Orthopaedic Surgery | Admitting: Orthopaedic Surgery

## 2014-08-15 DIAGNOSIS — Z01818 Encounter for other preprocedural examination: Secondary | ICD-10-CM | POA: Diagnosis not present

## 2014-08-15 DIAGNOSIS — I10 Essential (primary) hypertension: Secondary | ICD-10-CM

## 2014-08-15 HISTORY — DX: Unspecified osteoarthritis, unspecified site: M19.90

## 2014-08-15 LAB — URINALYSIS, ROUTINE W REFLEX MICROSCOPIC
BILIRUBIN URINE: NEGATIVE
Glucose, UA: NEGATIVE mg/dL
Ketones, ur: NEGATIVE mg/dL
LEUKOCYTES UA: NEGATIVE
Nitrite: NEGATIVE
PH: 6 (ref 5.0–8.0)
Protein, ur: NEGATIVE mg/dL
Specific Gravity, Urine: 1.02 (ref 1.005–1.030)
UROBILINOGEN UA: 0.2 mg/dL (ref 0.0–1.0)

## 2014-08-15 LAB — SURGICAL PCR SCREEN
MRSA, PCR: NEGATIVE
Staphylococcus aureus: POSITIVE — AB

## 2014-08-15 LAB — BASIC METABOLIC PANEL
ANION GAP: 10 (ref 5–15)
BUN: 10 mg/dL (ref 6–23)
CHLORIDE: 101 meq/L (ref 96–112)
CO2: 29 mmol/L (ref 19–32)
CREATININE: 0.56 mg/dL (ref 0.50–1.35)
Calcium: 9.8 mg/dL (ref 8.4–10.5)
GFR calc non Af Amer: >90 mL/min (ref >90)
Glucose, Bld: 172 mg/dL — ABNORMAL HIGH (ref 70–99)
POTASSIUM: 3.7 mmol/L (ref 3.5–5.1)
SODIUM: 140 mmol/L (ref 135–145)

## 2014-08-15 LAB — CBC
HEMATOCRIT: 49 % (ref 39.0–52.0)
HEMOGLOBIN: 17.1 g/dL — AB (ref 13.0–17.0)
MCH: 28.7 pg (ref 26.0–34.0)
MCHC: 34.9 g/dL (ref 30.0–36.0)
MCV: 82.4 fL (ref 78.0–100.0)
Platelets: 200 10*3/uL (ref 150–400)
RBC: 5.95 MIL/uL — AB (ref 4.22–5.81)
RDW: 13.3 % (ref 11.5–15.5)
WBC: 8.1 10*3/uL (ref 4.0–10.5)

## 2014-08-15 LAB — URINE MICROSCOPIC-ADD ON

## 2014-08-15 LAB — APTT: aPTT: 31 seconds (ref 24–37)

## 2014-08-15 LAB — PROTIME-INR
INR: 1.06 (ref 0.00–1.49)
Prothrombin Time: 13.9 seconds (ref 11.6–15.2)

## 2014-08-15 LAB — ABO/RH: ABO/RH(D): B POS

## 2014-08-15 NOTE — Progress Notes (Signed)
   08/15/14 1454  OBSTRUCTIVE SLEEP APNEA  Have you ever been diagnosed with sleep apnea through a sleep study? No  Do you snore loudly (loud enough to be heard through closed doors)?  0  Do you often feel tired, fatigued, or sleepy during the daytime? 0  Has anyone observed you stop breathing during your sleep? 0  Do you have, or are you being treated for high blood pressure? 1  Age over 68 years old? 1  Neck circumference greater than 40 cm/16 inches? 1  Gender: 1  Obstructive Sleep Apnea Score 4  Score 4 or greater  Results sent to PCP

## 2014-08-15 NOTE — Progress Notes (Signed)
UA faxed to Dr. Maureen Ralphs Blackman

## 2014-08-18 NOTE — Progress Notes (Signed)
[phone]  Pt notified

## 2014-08-19 ENCOUNTER — Other Ambulatory Visit (HOSPITAL_COMMUNITY): Payer: Self-pay | Admitting: Orthopaedic Surgery

## 2014-08-22 ENCOUNTER — Encounter (HOSPITAL_COMMUNITY)
Admission: RE | Disposition: A | Discharge status: Home Health | Payer: Self-pay | Source: Ambulatory Visit | Attending: Internal Medicine

## 2014-08-22 ENCOUNTER — Inpatient Hospital Stay (HOSPITAL_COMMUNITY)
Admission: RE | Admit: 2014-08-22 | Discharge: 2014-08-26 | DRG: 469 | Disposition: A | Discharge status: Home Health | Payer: Medicare PPO | Source: Ambulatory Visit | Attending: Internal Medicine | Admitting: Internal Medicine

## 2014-08-22 ENCOUNTER — Inpatient Hospital Stay (HOSPITAL_COMMUNITY): Payer: Medicare PPO

## 2014-08-22 ENCOUNTER — Inpatient Hospital Stay (HOSPITAL_COMMUNITY): Payer: Medicare PPO | Admitting: Certified Registered Nurse Anesthetist

## 2014-08-22 ENCOUNTER — Encounter (HOSPITAL_COMMUNITY): Payer: Self-pay | Admitting: *Deleted

## 2014-08-22 DIAGNOSIS — N179 Acute kidney failure, unspecified: Secondary | ICD-10-CM

## 2014-08-22 DIAGNOSIS — M659 Synovitis and tenosynovitis, unspecified: Secondary | ICD-10-CM | POA: Diagnosis present

## 2014-08-22 DIAGNOSIS — K913 Postprocedural intestinal obstruction: Secondary | ICD-10-CM | POA: Diagnosis not present

## 2014-08-22 DIAGNOSIS — M1711 Unilateral primary osteoarthritis, right knee: Secondary | ICD-10-CM

## 2014-08-22 DIAGNOSIS — E780 Pure hypercholesterolemia: Secondary | ICD-10-CM | POA: Diagnosis present

## 2014-08-22 DIAGNOSIS — Z79899 Other long term (current) drug therapy: Secondary | ICD-10-CM | POA: Diagnosis not present

## 2014-08-22 DIAGNOSIS — Z0189 Encounter for other specified special examinations: Secondary | ICD-10-CM

## 2014-08-22 DIAGNOSIS — E875 Hyperkalemia: Secondary | ICD-10-CM | POA: Diagnosis present

## 2014-08-22 DIAGNOSIS — R312 Other microscopic hematuria: Secondary | ICD-10-CM | POA: Diagnosis present

## 2014-08-22 DIAGNOSIS — D62 Acute posthemorrhagic anemia: Secondary | ICD-10-CM | POA: Diagnosis not present

## 2014-08-22 DIAGNOSIS — E785 Hyperlipidemia, unspecified: Secondary | ICD-10-CM | POA: Diagnosis present

## 2014-08-22 DIAGNOSIS — E119 Type 2 diabetes mellitus without complications: Secondary | ICD-10-CM

## 2014-08-22 DIAGNOSIS — M1712 Unilateral primary osteoarthritis, left knee: Secondary | ICD-10-CM | POA: Diagnosis present

## 2014-08-22 DIAGNOSIS — N170 Acute kidney failure with tubular necrosis: Secondary | ICD-10-CM | POA: Diagnosis not present

## 2014-08-22 DIAGNOSIS — K567 Ileus, unspecified: Secondary | ICD-10-CM | POA: Insufficient documentation

## 2014-08-22 DIAGNOSIS — Z794 Long term (current) use of insulin: Secondary | ICD-10-CM | POA: Diagnosis not present

## 2014-08-22 DIAGNOSIS — K9189 Other postprocedural complications and disorders of digestive system: Secondary | ICD-10-CM

## 2014-08-22 DIAGNOSIS — M25562 Pain in left knee: Secondary | ICD-10-CM | POA: Diagnosis present

## 2014-08-22 DIAGNOSIS — I10 Essential (primary) hypertension: Secondary | ICD-10-CM | POA: Diagnosis present

## 2014-08-22 DIAGNOSIS — E876 Hypokalemia: Secondary | ICD-10-CM | POA: Diagnosis not present

## 2014-08-22 DIAGNOSIS — Z96652 Presence of left artificial knee joint: Secondary | ICD-10-CM

## 2014-08-22 DIAGNOSIS — R140 Abdominal distension (gaseous): Secondary | ICD-10-CM | POA: Diagnosis present

## 2014-08-22 DIAGNOSIS — Z7982 Long term (current) use of aspirin: Secondary | ICD-10-CM

## 2014-08-22 DIAGNOSIS — D72829 Elevated white blood cell count, unspecified: Secondary | ICD-10-CM

## 2014-08-22 DIAGNOSIS — E1159 Type 2 diabetes mellitus with other circulatory complications: Secondary | ICD-10-CM | POA: Diagnosis present

## 2014-08-22 HISTORY — PX: TOTAL KNEE ARTHROPLASTY: SHX125

## 2014-08-22 LAB — TYPE AND SCREEN
ABO/RH(D): B POS
Antibody Screen: NEGATIVE

## 2014-08-22 LAB — GLUCOSE, CAPILLARY
GLUCOSE-CAPILLARY: 306 mg/dL — AB (ref 70–99)
GLUCOSE-CAPILLARY: 292 mg/dL — AB (ref 70–99)
GLUCOSE-CAPILLARY: 301 mg/dL — AB (ref 70–99)
GLUCOSE-CAPILLARY: 301 mg/dL — AB (ref 70–99)
GLUCOSE-CAPILLARY: 256 mg/dL — AB (ref 70–99)
Glucose-Capillary: 268 mg/dL — ABNORMAL HIGH (ref 70–99)
Glucose-Capillary: 226 mg/dL — ABNORMAL HIGH (ref 70–99)

## 2014-08-22 SURGERY — ARTHROPLASTY, KNEE, TOTAL
Anesthesia: Spinal | Laterality: Left

## 2014-08-22 MED ORDER — CEFAZOLIN SODIUM-DEXTROSE 2-3 GM-% IV SOLR
2.0000 g | INTRAVENOUS | Status: AC
  Administered 2014-08-22: 2 g via INTRAVENOUS

## 2014-08-22 MED ORDER — FENTANYL CITRATE 0.05 MG/ML IJ SOLN
INTRAMUSCULAR | Status: AC
  Filled 2014-08-22: qty 2

## 2014-08-22 MED ORDER — PHENYLEPHRINE 40 MCG/ML (10ML) SYRINGE FOR IV PUSH (FOR BLOOD PRESSURE SUPPORT)
PREFILLED_SYRINGE | INTRAVENOUS | Status: AC
  Filled 2014-08-22: qty 10

## 2014-08-22 MED ORDER — OXYCODONE HCL 5 MG PO TABS
5.0000 mg | ORAL_TABLET | ORAL | Status: DC | PRN
  Administered 2014-08-22: 15 mg via ORAL
  Administered 2014-08-22: 10 mg via ORAL
  Administered 2014-08-23 (×3): 15 mg via ORAL
  Filled 2014-08-22: qty 3
  Filled 2014-08-22: qty 2
  Filled 2014-08-22 (×3): qty 3

## 2014-08-22 MED ORDER — BUPIVACAINE IN DEXTROSE 0.75-8.25 % IT SOLN
INTRATHECAL | Status: DC | PRN
  Administered 2014-08-22: 1.8 mL via INTRATHECAL

## 2014-08-22 MED ORDER — PROPOFOL 10 MG/ML IV BOLUS
INTRAVENOUS | Status: DC | PRN
  Administered 2014-08-22 (×4): 20 mg via INTRAVENOUS

## 2014-08-22 MED ORDER — HYDROMORPHONE HCL 1 MG/ML IJ SOLN
1.0000 mg | INTRAMUSCULAR | Status: DC | PRN
  Administered 2014-08-22: 1 mg via INTRAVENOUS
  Filled 2014-08-22: qty 1

## 2014-08-22 MED ORDER — OXYCODONE HCL 5 MG PO TABS
5.0000 mg | ORAL_TABLET | ORAL | Status: DC | PRN
  Administered 2014-08-22: 10 mg via ORAL
  Filled 2014-08-22: qty 2

## 2014-08-22 MED ORDER — METOPROLOL SUCCINATE ER 50 MG PO TB24
50.0000 mg | ORAL_TABLET | Freq: Every day | ORAL | Status: DC
  Administered 2014-08-22 – 2014-08-23 (×2): 50 mg via ORAL
  Filled 2014-08-22 (×2): qty 1

## 2014-08-22 MED ORDER — HYDROMORPHONE HCL 1 MG/ML IJ SOLN
0.2500 mg | INTRAMUSCULAR | Status: DC | PRN
  Administered 2014-08-22 (×2): 0.5 mg via INTRAVENOUS

## 2014-08-22 MED ORDER — DOCUSATE SODIUM 100 MG PO CAPS
100.0000 mg | ORAL_CAPSULE | Freq: Two times a day (BID) | ORAL | Status: DC
  Administered 2014-08-22 – 2014-08-23 (×3): 100 mg via ORAL

## 2014-08-22 MED ORDER — RAMIPRIL 10 MG PO CAPS
10.0000 mg | ORAL_CAPSULE | Freq: Every day | ORAL | Status: DC
  Administered 2014-08-22 – 2014-08-23 (×2): 10 mg via ORAL
  Filled 2014-08-22 (×2): qty 1

## 2014-08-22 MED ORDER — METHOCARBAMOL 500 MG PO TABS
500.0000 mg | ORAL_TABLET | Freq: Four times a day (QID) | ORAL | Status: DC | PRN
  Administered 2014-08-22 – 2014-08-23 (×2): 500 mg via ORAL
  Filled 2014-08-22 (×2): qty 1

## 2014-08-22 MED ORDER — HYDROMORPHONE HCL 1 MG/ML IJ SOLN
INTRAMUSCULAR | Status: AC
  Administered 2014-08-22: 1 mg via INTRAVENOUS
  Filled 2014-08-22: qty 1

## 2014-08-22 MED ORDER — PROPOFOL INFUSION 10 MG/ML OPTIME
INTRAVENOUS | Status: DC | PRN
  Administered 2014-08-22: 50 ug/kg/min via INTRAVENOUS

## 2014-08-22 MED ORDER — METFORMIN HCL 500 MG PO TABS
1000.0000 mg | ORAL_TABLET | Freq: Two times a day (BID) | ORAL | Status: DC
Start: 2014-08-22 — End: 2014-08-22

## 2014-08-22 MED ORDER — HYDROMORPHONE HCL 1 MG/ML IJ SOLN
INTRAMUSCULAR | Status: AC
  Administered 2014-08-22: 1 mg
  Filled 2014-08-22: qty 1

## 2014-08-22 MED ORDER — RIVAROXABAN 10 MG PO TABS
10.0000 mg | ORAL_TABLET | Freq: Every day | ORAL | Status: DC
  Administered 2014-08-23: 10 mg via ORAL
  Filled 2014-08-22 (×2): qty 1

## 2014-08-22 MED ORDER — ONDANSETRON HCL 4 MG/2ML IJ SOLN: 4.0000 mg | Freq: Four times a day (QID) | INTRAMUSCULAR | Status: DC | PRN

## 2014-08-22 MED ORDER — KETOROLAC TROMETHAMINE 30 MG/ML IJ SOLN: 30.0000 mg | Freq: Four times a day (QID) | INTRAMUSCULAR | Status: DC

## 2014-08-22 MED ORDER — 0.9 % SODIUM CHLORIDE (POUR BTL) OPTIME
TOPICAL | Status: DC | PRN
  Administered 2014-08-22: 1000 mL

## 2014-08-22 MED ORDER — AMLODIPINE BESYLATE 10 MG PO TABS
10.0000 mg | ORAL_TABLET | Freq: Every morning | ORAL | Status: DC
  Administered 2014-08-23: 10 mg via ORAL
  Filled 2014-08-22: qty 1

## 2014-08-22 MED ORDER — PHENOL 1.4 % MT LIQD: 1.0000 | OROMUCOSAL | Status: DC | PRN

## 2014-08-22 MED ORDER — KETOROLAC TROMETHAMINE 15 MG/ML IJ SOLN
15.0000 mg | Freq: Four times a day (QID) | INTRAMUSCULAR | Status: AC
  Administered 2014-08-22 – 2014-08-23 (×4): 15 mg via INTRAVENOUS
  Filled 2014-08-22 (×4): qty 1

## 2014-08-22 MED ORDER — CEFAZOLIN SODIUM-DEXTROSE 2-3 GM-% IV SOLR
2.0000 g | Freq: Four times a day (QID) | INTRAVENOUS | Status: AC
  Administered 2014-08-22 (×2): 2 g via INTRAVENOUS
  Filled 2014-08-22 (×2): qty 50

## 2014-08-22 MED ORDER — POLYETHYLENE GLYCOL 3350 17 G PO PACK: 17.0000 g | PACK | Freq: Every day | ORAL | Status: DC | PRN

## 2014-08-22 MED ORDER — ACETAMINOPHEN 325 MG PO TABS
650.0000 mg | ORAL_TABLET | Freq: Four times a day (QID) | ORAL | Status: DC | PRN
  Administered 2014-08-23 – 2014-08-26 (×2): 650 mg via ORAL
  Filled 2014-08-22 (×2): qty 2

## 2014-08-22 MED ORDER — INSULIN ASPART PROT & ASPART (70-30 MIX) 100 UNIT/ML ~~LOC~~ SUSP
10.0000 [IU] | Freq: Every day | SUBCUTANEOUS | Status: DC
  Filled 2014-08-22: qty 10

## 2014-08-22 MED ORDER — SODIUM CHLORIDE 0.9 % IR SOLN
Status: DC | PRN
  Administered 2014-08-22 (×2): 1000 mL

## 2014-08-22 MED ORDER — INSULIN ASPART 100 UNIT/ML ~~LOC~~ SOLN
0.0000 [IU] | Freq: Every day | SUBCUTANEOUS | Status: DC
  Administered 2014-08-22: 3 [IU] via SUBCUTANEOUS

## 2014-08-22 MED ORDER — INSULIN ASPART 100 UNIT/ML ~~LOC~~ SOLN
0.0000 [IU] | Freq: Three times a day (TID) | SUBCUTANEOUS | Status: DC
  Administered 2014-08-22: 15 [IU] via SUBCUTANEOUS
  Administered 2014-08-23: 4 [IU] via SUBCUTANEOUS
  Administered 2014-08-23: 7 [IU] via SUBCUTANEOUS
  Administered 2014-08-23: 4 [IU] via SUBCUTANEOUS

## 2014-08-22 MED ORDER — SODIUM CHLORIDE 0.9 % IV SOLN
INTRAVENOUS | Status: DC
  Administered 2014-08-22: 75 mL/h via INTRAVENOUS
  Administered 2014-08-23 – 2014-08-25 (×3): via INTRAVENOUS

## 2014-08-22 MED ORDER — METHOCARBAMOL 1000 MG/10ML IJ SOLN
500.0000 mg | Freq: Four times a day (QID) | INTRAVENOUS | Status: DC | PRN
  Administered 2014-08-22: 500 mg via INTRAVENOUS
  Filled 2014-08-22 (×2): qty 5

## 2014-08-22 MED ORDER — MIDAZOLAM HCL 5 MG/5ML IJ SOLN
INTRAMUSCULAR | Status: DC | PRN
  Administered 2014-08-22: 2 mg via INTRAVENOUS

## 2014-08-22 MED ORDER — PROPOFOL 10 MG/ML IV BOLUS
INTRAVENOUS | Status: AC
  Filled 2014-08-22: qty 20

## 2014-08-22 MED ORDER — METOCLOPRAMIDE HCL 5 MG/ML IJ SOLN: 5.0000 mg | Freq: Three times a day (TID) | INTRAMUSCULAR | Status: DC | PRN

## 2014-08-22 MED ORDER — INSULIN ASPART PROT & ASPART (70-30 MIX) 100 UNIT/ML ~~LOC~~ SUSP
30.0000 [IU] | Freq: Every day | SUBCUTANEOUS | Status: DC
  Administered 2014-08-23: 30 [IU] via SUBCUTANEOUS
  Filled 2014-08-22: qty 10

## 2014-08-22 MED ORDER — SODIUM CHLORIDE 0.9 % IJ SOLN
INTRAMUSCULAR | Status: DC | PRN
  Administered 2014-08-22: 40 mL via INTRAVENOUS

## 2014-08-22 MED ORDER — FENTANYL CITRATE 0.05 MG/ML IJ SOLN
INTRAMUSCULAR | Status: DC | PRN
  Administered 2014-08-22 (×2): 50 ug via INTRAVENOUS

## 2014-08-22 MED ORDER — SIMVASTATIN 10 MG PO TABS
10.0000 mg | ORAL_TABLET | Freq: Every day | ORAL | Status: DC
  Administered 2014-08-22 – 2014-08-23 (×2): 10 mg via ORAL
  Filled 2014-08-22 (×2): qty 1

## 2014-08-22 MED ORDER — METOPROLOL TARTRATE 1 MG/ML IV SOLN
INTRAVENOUS | Status: AC
  Filled 2014-08-22: qty 5

## 2014-08-22 MED ORDER — METFORMIN HCL 500 MG PO TABS
1000.0000 mg | ORAL_TABLET | Freq: Two times a day (BID) | ORAL | Status: DC
  Administered 2014-08-22: 1000 mg via ORAL
  Filled 2014-08-22 (×4): qty 2

## 2014-08-22 MED ORDER — CEFAZOLIN SODIUM-DEXTROSE 2-3 GM-% IV SOLR
INTRAVENOUS | Status: AC
  Filled 2014-08-22: qty 50

## 2014-08-22 MED ORDER — DIPHENHYDRAMINE HCL 12.5 MG/5ML PO ELIX: 12.5000 mg | ORAL_SOLUTION | ORAL | Status: DC | PRN

## 2014-08-22 MED ORDER — BUPIVACAINE LIPOSOME 1.3 % IJ SUSP
20.0000 mL | Freq: Once | INTRAMUSCULAR | Status: AC
  Administered 2014-08-22: 20 mL
  Filled 2014-08-22: qty 20

## 2014-08-22 MED ORDER — INSULIN ASPART 100 UNIT/ML ~~LOC~~ SOLN
10.0000 [IU] | Freq: Every day | SUBCUTANEOUS | Status: DC
  Administered 2014-08-23: 10 [IU] via SUBCUTANEOUS

## 2014-08-22 MED ORDER — LACTATED RINGERS IV SOLN
INTRAVENOUS | Status: DC
  Administered 2014-08-22: 1000 mL via INTRAVENOUS
  Administered 2014-08-22 (×2): via INTRAVENOUS

## 2014-08-22 MED ORDER — ACETAMINOPHEN 650 MG RE SUPP: 650.0000 mg | Freq: Four times a day (QID) | RECTAL | Status: DC | PRN

## 2014-08-22 MED ORDER — ONDANSETRON HCL 4 MG/2ML IJ SOLN
INTRAMUSCULAR | Status: DC | PRN
  Administered 2014-08-22: 4 mg via INTRAVENOUS

## 2014-08-22 MED ORDER — OXYCODONE HCL ER 20 MG PO T12A
20.0000 mg | EXTENDED_RELEASE_TABLET | Freq: Two times a day (BID) | ORAL | Status: DC
Start: 2014-08-22 — End: 2014-08-24
  Administered 2014-08-22 – 2014-08-23 (×3): 20 mg via ORAL
  Filled 2014-08-22 (×3): qty 1

## 2014-08-22 MED ORDER — MENTHOL 3 MG MT LOZG: 1.0000 | LOZENGE | OROMUCOSAL | Status: DC | PRN

## 2014-08-22 MED ORDER — MIDAZOLAM HCL 2 MG/2ML IJ SOLN
INTRAMUSCULAR | Status: AC
  Filled 2014-08-22: qty 2

## 2014-08-22 MED ORDER — ZOLPIDEM TARTRATE 5 MG PO TABS: 5.0000 mg | ORAL_TABLET | Freq: Every evening | ORAL | Status: DC | PRN

## 2014-08-22 MED ORDER — METOPROLOL TARTRATE 1 MG/ML IV SOLN
INTRAVENOUS | Status: DC | PRN
  Administered 2014-08-22 (×2): 1 mg via INTRAVENOUS

## 2014-08-22 MED ORDER — SODIUM CHLORIDE 0.9 % IJ SOLN
INTRAMUSCULAR | Status: AC
  Filled 2014-08-22: qty 50

## 2014-08-22 MED ORDER — TRANEXAMIC ACID 100 MG/ML IV SOLN
1000.0000 mg | INTRAVENOUS | Status: AC
  Administered 2014-08-22: 1000 mg via INTRAVENOUS
  Filled 2014-08-22: qty 10

## 2014-08-22 MED ORDER — ALUM & MAG HYDROXIDE-SIMETH 200-200-20 MG/5ML PO SUSP: 30.0000 mL | ORAL | Status: DC | PRN

## 2014-08-22 MED ORDER — INSULIN ASPART PROT & ASPART (70-30 MIX) 100 UNIT/ML ~~LOC~~ SUSP
10.0000 [IU] | Freq: Every day | SUBCUTANEOUS | Status: AC
  Administered 2014-08-22: 10 [IU] via SUBCUTANEOUS
  Filled 2014-08-22: qty 10

## 2014-08-22 MED ORDER — METOCLOPRAMIDE HCL 10 MG PO TABS: 5.0000 mg | ORAL_TABLET | Freq: Three times a day (TID) | ORAL | Status: DC | PRN

## 2014-08-22 MED ORDER — PHENYLEPHRINE HCL 10 MG/ML IJ SOLN
INTRAMUSCULAR | Status: DC | PRN
  Administered 2014-08-22 (×3): 80 ug via INTRAVENOUS

## 2014-08-22 MED ORDER — HYDROMORPHONE HCL 1 MG/ML IJ SOLN: 0.2500 mg | INTRAMUSCULAR | Status: DC | PRN

## 2014-08-22 MED ORDER — HYDROCHLOROTHIAZIDE 12.5 MG PO CAPS
12.5000 mg | ORAL_CAPSULE | Freq: Every day | ORAL | Status: DC
  Administered 2014-08-22 – 2014-08-23 (×2): 12.5 mg via ORAL
  Filled 2014-08-22 (×2): qty 1

## 2014-08-22 MED ORDER — ACETAMINOPHEN 10 MG/ML IV SOLN
1000.0000 mg | Freq: Once | INTRAVENOUS | Status: AC
  Administered 2014-08-22: 1000 mg via INTRAVENOUS
  Filled 2014-08-22: qty 100

## 2014-08-22 MED ORDER — RAMIPRIL 10 MG PO TABS: 10.0000 mg | ORAL_TABLET | Freq: Every day | ORAL | Status: DC

## 2014-08-22 MED ORDER — ONDANSETRON HCL 4 MG PO TABS: 4.0000 mg | ORAL_TABLET | Freq: Four times a day (QID) | ORAL | Status: DC | PRN

## 2014-08-22 SURGICAL SUPPLY — 60 items
APL SKNCLS STERI-STRIP NONHPOA (GAUZE/BANDAGES/DRESSINGS) ×1
BAG SPEC THK2 15X12 ZIP CLS (MISCELLANEOUS)
BAG ZIPLOCK 12X15 (MISCELLANEOUS) IMPLANT
BANDAGE ELASTIC 6 VELCRO ST LF (GAUZE/BANDAGES/DRESSINGS) ×3 IMPLANT
BANDAGE ESMARK 6X9 LF (GAUZE/BANDAGES/DRESSINGS) ×1 IMPLANT
BENZOIN TINCTURE PRP APPL 2/3 (GAUZE/BANDAGES/DRESSINGS) ×2 IMPLANT
BLADE SAG 13.0X1.37X90 (BLADE) IMPLANT
BLADE SAG 18X100X1.27 (BLADE) IMPLANT
BLADE SAGITTAL 25.0X1.37X90 (BLADE) IMPLANT
BLADE SAGITTAL 25.0X1.37X90MM (BLADE)
BNDG CMPR 9X6 STRL LF SNTH (GAUZE/BANDAGES/DRESSINGS) ×1
BNDG ESMARK 6X9 LF (GAUZE/BANDAGES/DRESSINGS) ×3
BOWL SMART MIX CTS (DISPOSABLE) ×3 IMPLANT
CAPT KNEE TOTAL 3 ×2 IMPLANT
CEMENT BONE 1-PACK (Cement) ×4 IMPLANT
CLOSURE WOUND 1/2 X4 (GAUZE/BANDAGES/DRESSINGS) ×1
CUFF TOURN SGL QUICK 34 (TOURNIQUET CUFF) ×3
CUFF TRNQT CYL 34X4X40X1 (TOURNIQUET CUFF) ×1 IMPLANT
DRAPE EXTREMITY T 121X128X90 (DRAPE) ×3 IMPLANT
DRAPE POUCH INSTRU U-SHP 10X18 (DRAPES) ×3 IMPLANT
DRAPE SHEET LG 3/4 BI-LAMINATE (DRAPES) IMPLANT
DRAPE U-SHAPE 47X51 STRL (DRAPES) ×3 IMPLANT
DRSG PAD ABDOMINAL 8X10 ST (GAUZE/BANDAGES/DRESSINGS) ×3 IMPLANT
DURAPREP 26ML APPLICATOR (WOUND CARE) ×3 IMPLANT
ELECT REM PT RETURN 9FT ADLT (ELECTROSURGICAL) ×3
ELECTRODE REM PT RTRN 9FT ADLT (ELECTROSURGICAL) ×1 IMPLANT
FACESHIELD WRAPAROUND (MASK) ×15 IMPLANT
FACESHIELD WRAPAROUND OR TEAM (MASK) ×5 IMPLANT
GAUZE SPONGE 4X4 12PLY STRL (GAUZE/BANDAGES/DRESSINGS) ×3 IMPLANT
GAUZE XEROFORM 1X8 LF (GAUZE/BANDAGES/DRESSINGS) IMPLANT
GLOVE BIO SURGEON STRL SZ7.5 (GLOVE) ×3 IMPLANT
GLOVE BIOGEL PI IND STRL 8 (GLOVE) ×2 IMPLANT
GLOVE BIOGEL PI INDICATOR 8 (GLOVE) ×4
GLOVE ECLIPSE 8.0 STRL XLNG CF (GLOVE) ×3 IMPLANT
GOWN STRL REUS W/TWL XL LVL3 (GOWN DISPOSABLE) ×6 IMPLANT
HANDPIECE INTERPULSE COAX TIP (DISPOSABLE) ×3
IMMOBILIZER KNEE 20 (SOFTGOODS) ×3
IMMOBILIZER KNEE 20 THIGH 36 (SOFTGOODS) ×1 IMPLANT
KIT BASIN OR (CUSTOM PROCEDURE TRAY) ×3 IMPLANT
NS IRRIG 1000ML POUR BTL (IV SOLUTION) ×3 IMPLANT
PACK TOTAL JOINT (CUSTOM PROCEDURE TRAY) ×3 IMPLANT
PADDING CAST COTTON 6X4 STRL (CAST SUPPLIES) ×6 IMPLANT
POSITIONER SURGICAL ARM (MISCELLANEOUS) ×3 IMPLANT
SET HNDPC FAN SPRY TIP SCT (DISPOSABLE) ×1 IMPLANT
SET PAD KNEE POSITIONER (MISCELLANEOUS) ×3 IMPLANT
STAPLER VISISTAT 35W (STAPLE) IMPLANT
STRIP CLOSURE SKIN 1/2X4 (GAUZE/BANDAGES/DRESSINGS) ×1 IMPLANT
SUCTION FRAZIER 12FR DISP (SUCTIONS) ×3 IMPLANT
SUT MNCRL AB 4-0 PS2 18 (SUTURE) ×2 IMPLANT
SUT VIC AB 0 CT1 27 (SUTURE) ×3
SUT VIC AB 0 CT1 27XBRD ANTBC (SUTURE) ×1 IMPLANT
SUT VIC AB 1 CT1 27 (SUTURE) ×6
SUT VIC AB 1 CT1 27XBRD ANTBC (SUTURE) ×2 IMPLANT
SUT VIC AB 2-0 CT1 27 (SUTURE) ×6
SUT VIC AB 2-0 CT1 TAPERPNT 27 (SUTURE) ×2 IMPLANT
TOWEL OR 17X26 10 PK STRL BLUE (TOWEL DISPOSABLE) ×3 IMPLANT
TOWEL OR NON WOVEN STRL DISP B (DISPOSABLE) IMPLANT
TRAY FOLEY CATH 16FRSI W/METER (SET/KITS/TRAYS/PACK) ×2 IMPLANT
WATER STERILE IRR 1500ML POUR (IV SOLUTION) ×3 IMPLANT
WRAP KNEE MAXI GEL POST OP (GAUZE/BANDAGES/DRESSINGS) ×3 IMPLANT

## 2014-08-22 NOTE — Anesthesia Postprocedure Evaluation (Signed)
  Anesthesia Post-op Note  Patient: Christopher Rasmussen  Procedure(s) Performed: Procedure(s): LEFT TOTAL KNEE ARTHROPLASTY (Left)  Patient Location: PACU  Anesthesia Type:Spinal  Level of Consciousness: awake and alert   Airway and Oxygen Therapy: Patient Spontanous Breathing  Post-op Pain: none  Post-op Assessment: Post-op Vital signs reviewed  Post-op Vital Signs: Reviewed  Last Vitals:  Filed Vitals:   08/22/14 1638  BP: 158/64  Pulse:   Temp:   Resp:     Complications: No apparent anesthesia complications

## 2014-08-22 NOTE — Anesthesia Preprocedure Evaluation (Addendum)
Anesthesia Evaluation  Patient identified by MRN, date of birth, ID band Patient awake    Reviewed: Allergy & Precautions, H&P , Patient's Chart, lab work & pertinent test results  Airway Mallampati: II  TM Distance: >3 FB Neck ROM: full    Dental no notable dental hx.    Pulmonary  breath sounds clear to auscultation  Pulmonary exam normal       Cardiovascular Exercise Tolerance: Good hypertension, On Medications Rhythm:regular Rate:Normal     Neuro/Psych    GI/Hepatic   Endo/Other  diabetes, Type 2, Insulin Dependent  Renal/GU      Musculoskeletal   Abdominal   Peds  Hematology   Anesthesia Other Findings   Reproductive/Obstetrics                           Anesthesia Physical Anesthesia Plan  ASA: II  Anesthesia Plan: Spinal   Post-op Pain Management:    Induction:   Airway Management Planned:   Additional Equipment:   Intra-op Plan:   Post-operative Plan:   Informed Consent: I have reviewed the patients History and Physical, chart, labs and discussed the procedure including the risks, benefits and alternatives for the proposed anesthesia with the patient or authorized representative who has indicated his/her understanding and acceptance.   Dental Advisory Given  Plan Discussed with: CRNA  Anesthesia Plan Comments: (Lab work confirmed with CRNA in room. Platelets okay. Discussed spinal anesthetic, and patient consents to the procedure:  included risk of possible headache,backache, failed block, allergic reaction, and nerve injury. This patient was asked if she had any questions or concerns before the procedure started. )        Anesthesia Quick Evaluation

## 2014-08-22 NOTE — Transfer of Care (Signed)
Immediate Anesthesia Transfer of Care Note  Patient: Christopher Rasmussen  Procedure(s) Performed: Procedure(s) (LRB): LEFT TOTAL KNEE ARTHROPLASTY (Left)  Patient Location: PACU  Anesthesia Type: Spinal  Level of Consciousness: sedated, patient cooperative and responds to stimulation  Airway & Oxygen Therapy: Patient Spontanous Breathing and Patient connected to face mask oxgen  Post-op Assessment: Report given to PACU RN and Post -op Vital signs reviewed and stable  Post vital signs: Reviewed and stable  Complications: No apparent anesthesia complications

## 2014-08-22 NOTE — Progress Notes (Signed)
cbg of 292 called to dianne in OR. OR i sready for pt and holding will be informed of this (per [place])

## 2014-08-22 NOTE — Anesthesia Procedure Notes (Signed)
Spinal Patient location during procedure: OR Start time: 08/22/2014 10:50 AM End time: 08/22/2014 11:02 AM Staffing Anesthesiologist: Cristela BlueJACKSON, KYLE Resident/CRNA: Lyda KalataJARVELA, Natajah Derderian R Performed by: resident/CRNA  Preanesthetic Checklist Completed: patient identified, site marked, surgical consent, pre-op evaluation, timeout performed, IV checked, risks and benefits discussed and monitors and equipment checked Spinal Block Patient position: sitting Prep: Betadine Patient monitoring: heart rate, cardiac monitor, continuous pulse ox and blood pressure Approach: midline Location: L3-4 Injection technique: single-shot Needle Needle type: Sprotte  Needle gauge: 24 G Needle length: 10 cm Needle insertion depth: 8 cm Assessment Sensory level: T6

## 2014-08-22 NOTE — Brief Op Note (Signed)
08/22/2014  12:31 PM  PATIENT:  Christopher Rasmussen  69 y.o. male  PRE-OPERATIVE DIAGNOSIS:  Left knee primary osteoarthritis  POST-OPERATIVE DIAGNOSIS:  Left knee primary osteoarthritis  PROCEDURE:  Procedure(s): LEFT TOTAL KNEE ARTHROPLASTY (Left)  SURGEON:  Surgeon(s) and Role:    * Christopher Hitchhristopher Y Jamisha Hoeschen, MD - Primary  PHYSICIAN ASSISTANT: Christopher EdisonGil Clark, PA-C  ANESTHESIA:   spinal  EBL:  Total I/O In: 2000 [I.V.:2000] Out: -   BLOOD ADMINISTERED:none  DRAINS: none   LOCAL MEDICATIONS USED:  OTHER Experil  SPECIMEN:  No Specimen  DISPOSITION OF SPECIMEN:  N/A  COUNTS:  YES  TOURNIQUET:   Total Tourniquet Time Documented: Thigh (Left) - 59 minutes Total: Thigh (Left) - 59 minutes   DICTATION: .Other Dictation: Dictation Number [id number]  PLAN OF CARE: Admit to inpatient   PATIENT DISPOSITION:  PACU - hemodynamically stable.   Delay start of Pharmacological VTE agent (>24hrs) due to surgical blood loss or risk of bleeding: no

## 2014-08-22 NOTE — Op Note (Signed)
NAMEMarland Kitchen  Christopher Rasmussen, Christopher Rasmussen NO.:  [id number]  MEDICAL RECORD NO.:  [id number]  LOCATION:  1604                         FACILITY[facility]  [facility]  PHYSICIAN:  Christopher Rasmussen, Christopher RasmussenDATE OF BIRTH:  1946/08/08  DATE OF PROCEDURE:  08/22/2014 DATE OF DISCHARGE:                              OPERATIVE REPORT   PREOPERATIVE DIAGNOSIS:  Primary osteoarthritis and degenerative joint disease, left knee.  POSTOPERATIVE DIAGNOSES:  Primary osteoarthritis and degenerative joint disease, left knee.  PROCEDURE:  Left total knee arthroplasty.  IMPLANTS:  Stryker Triathlon knee with size 4 femur, size 4 tibial tray, 11 mm fix bearing polyethylene insert, size 32 patellar button.  SURGEON:  Christopher Rasmussen, M.D.  ASSISTANT:  Christopher Canal, PA-C.  ANESTHESIA: 1. Spinal. 2. Local with Exparel.  TOURNIQUET TIME:  Less than 1 hour.  BLOOD LOSS:  Less than 100 mL.  COMPLICATIONS:  None.  INDICATIONS:  Christopher Rasmussen is a 69 year old gentleman well known to me. He has severe varus deformities of both knees, well-documented osteoarthritis with complete loss of the joint space on both knees and especially the medial compartment.  There is severe periarticular osteophytes throughout the knee and he shows sclerotic changes and cystic changes.  He has tried all forms of conservative treatment and with his decrease in activities of daily living, his decreased mobility, and daily pain, he wishes to proceed with a total knee arthroplasty.  He understands the risk of acute blood loss anemia, nerve and vessel injury, fracture, infection, and DVT.  He understands the goals are decreased pain, improved mobility, and overall improved quality of life.  PROCEDURE DESCRIPTION:  After informed consent was obtained, appropriate left knee was marked.  He was brought to the operating room, and while he was on the operating table, spinal anesthesia was obtained.  He was then laid in the supine  position.  A Foley catheter was placed and a nonsterile tourniquet was placed on his upper left thigh.  His left leg was then prepped and draped with DuraPrep and sterile drapes.  A time- out was called.  He was identified as correct patient, correct left leg. We then used an Esmarch to wrap out the leg and tourniquet was inflated to 300 mm of pressure.  I then made a direct incision over the patella and carried this proximally and distally.  We dissected down the knee joint, performed a medial parapatellar arthrotomy finding a large effusion and significant synovitis throughout the knee.  There were large osteophytes that we removed with a rongeur.  Then, with the knee in a flexed position, we were able to place our extramedullary cutting guide for the tibia based off 9 mm off the high side, correcting her varus or valgus, and negative slope.  We then made this cut without difficulty.  Attention was then turned to the femur.  Using the intramedullary guide for the femur and setting our distal femoral cutting block at a 10 mm cut, 5 degrees externally rotated for left knee.  We made this cut.  We brought the knee down into extension and with a 9 mm extension block, he actually hyperextended just a little bit.  He had  significant flexion contracture before.  We then went back to the femur and used our femoral sizing guide based off the epicondylar axis, and chose a size 4 femur.  We then put our 4:1 cutting block and our anterior and posterior cuts followed by our chamfer cuts.  We then made our femoral box cut.  We then went back to the tibia and trialed for a size 4 tibia.  I was pleased with the size 4 tibia as far as it covered to the tibial plateau.  We did our keel punch off of this.  With the trial tibia and trial femur, we tried an 11 mm polyethylene insert. I was pleased with the stability and motion.  We then made our patellar cut and drilled holes for patellar button.  With all  trial components in place again I was pleased with stability and range of motion.  We then removed all trial components and irrigated the knee with normal saline solution using pulsatile lavage.  I then infiltrated the joint capsule with Exparel and then mixed our cement.  We then cemented the real Stryker Triathlon tibial tray size 4, the real size 4 femur, we placed the real 9 mm fix bearing polyethylene insert, and cemented the patellar button.  We then mixed cement debris before it hardened.  Once the cement had hardened, the tourniquet was let down and hemostasis was obtained with electrocautery.  We then irrigated the knee again with normal saline solution using pulsatile lavage and closed the arthrotomy with interrupted #1 Vicryl suture followed by 0-Vicryl in the deep tissue, 2-0 Vicryl in the subcutaneous tissue, and 4-0 Monocryl subcuticular stitch, and Steri-Strips on the skin.  Well-padded sterile dressing was applied and he was taken off the operative table and taken to the recovery room in a stable condition.  All final counts were correct.  No complications noted.  Of note, Christopher CanalGilbert Clark, PA-C assisted in the entire case and his assistance was crucial for facilitating all aspects of this case.     Christopher Rasmussen, M.D.     CYB/MEDQ  D:  08/22/2014  T:  08/22/2014  Job:  [id number]

## 2014-08-22 NOTE — Progress Notes (Signed)
Inpatient Diabetes Program Recommendations  AACE/ADA: New Consensus Statement on Inpatient Glycemic Control (2013)  Target Ranges:  Prepandial:   less than 140 mg/dL      Peak postprandial:   less than 180 mg/dL (1-2 hours)      Critically ill patients:  140 - 180 mg/dL   Reason for Visit: Hyperglycemia  Diabetes history: DM2 Outpatient Diabetes medications: 70/30 30 units QAM, Novolog 10 units Q lunch, metformin 1000 mg bid Current orders for Inpatient glycemic control: Same as above + 70/30 10 units ac supper (1x dose 1/15), Novolog resistant tidwc and hs  Results for Christopher Rasmussen, [name] (MRN [id number]) as of 08/22/2014 16:03  Ref. Range 08/22/2014 08:19 08/22/2014 08:32 08/22/2014 09:30 08/22/2014 11:30 08/22/2014 13:37  Glucose-Capillary Latest Range: 70-99 mg/dL 829306 (H) 562292 (H) 130301 (H) 268 (H) 226 (H)    Inpatient Diabetes Program Recommendations Insulin - Basal: 70/30 10 units ac supper - 1x dose on 1/15 Correction (SSI): Novolog resistant tidwc and hs Insulin - Meal Coverage: Novolog 10 units Q lunch meal Oral Agents: metformin 1000 mg bid Diet: When advanced CHO mod med  Note: Pt states he just started taking insulin approx 3 months ago. Checks blood sugars at home and states he has f/u appt with PCP regarding his diabetes next week. Briefly discussed importance of taking logbook of blood sugars to OV for MD to adjust insulin.  Discussed with MD and RN. Thank you. Ailene Ardshonda Jamie Belger, RD, LDN, CDE Inpatient Diabetes Coordinator [phone]

## 2014-08-22 NOTE — Progress Notes (Signed)
Utilization review completed.  

## 2014-08-22 NOTE — H&P (Signed)
TOTAL KNEE ADMISSION H&P  Patient is being admitted for left total knee arthroplasty.  Subjective:  Chief Complaint:left knee pain.  HPI: Christopher Rasmussen, 69 y.o. male, has a history of pain and functional disability in the left knee due to arthritis and has failed non-surgical conservative treatments for greater than 12 weeks to includeNSAID's and/or analgesics, corticosteriod injections, viscosupplementation injections, flexibility and strengthening excercises, use of assistive devices, weight reduction as appropriate and activity modification.  Onset of symptoms was gradual, starting 5 years ago with gradually worsening course since that time. The patient noted no past surgery on the left knee(s).  Patient currently rates pain in the left knee(s) at 9 out of 10 with activity. Patient has night pain, worsening of pain with activity and weight bearing, pain that interferes with activities of daily living, pain with passive range of motion, crepitus and joint swelling.  Patient has evidence of subchondral cysts, subchondral sclerosis, periarticular osteophytes and joint space narrowing by imaging studies. There is no active infection.  Patient Active Problem List   Diagnosis Date Noted  . Arthritis of right knee 08/22/2014  . Chest pain 02/22/2013  . HTN (hypertension) 02/22/2013  . HLD (hyperlipidemia) 02/22/2013  . Diabetes 02/22/2013   Past Medical History  Diagnosis Date  . Hypertension   . High cholesterol   . Type II diabetes mellitus   . Arthritis     Past Surgical History  Procedure Laterality Date  . Knee arthroscopy Left 1990's    No prescriptions prior to admission   No Known Allergies  History  Substance Use Topics  . Smoking status: Never Smoker   . Smokeless tobacco: Never Used  . Alcohol Use: No    No family history on file.   Review of Systems  Musculoskeletal: Positive for joint pain.  All other systems reviewed and are negative.   Objective:  Physical  Exam  Constitutional: He is oriented to person, place, and time. He appears well-developed and well-nourished.  HENT:  Head: Normocephalic and atraumatic.  Eyes: EOM are normal. Pupils are equal, round, and reactive to light.  Neck: Normal range of motion. Neck supple.  Cardiovascular: Normal rate and regular rhythm.   Respiratory: Effort normal and breath sounds normal.  GI: Soft. Bowel sounds are normal.  Musculoskeletal:       Left knee: He exhibits decreased range of motion, swelling, effusion, abnormal alignment and bony tenderness. Tenderness found. Medial joint line and lateral joint line tenderness noted.  Neurological: He is alert and oriented to person, place, and time.  Skin: Skin is warm and dry.  Psychiatric: He has a normal mood and affect.    Vital signs in last 24 hours:    Labs:   There is no height on file to calculate BMI.   Imaging Review Plain radiographs demonstrate severe degenerative joint disease of the left knee(s). The overall alignment ismild varus. The bone quality appears to be good for age and reported activity level.  Assessment/Plan:  End stage arthritis, left knee   The patient history, physical examination, clinical judgment of the provider and imaging studies are consistent with end stage degenerative joint disease of the left knee(s) and total knee arthroplasty is deemed medically necessary. The treatment options including medical management, injection therapy arthroscopy and arthroplasty were discussed at length. The risks and benefits of total knee arthroplasty were presented and reviewed. The risks due to aseptic loosening, infection, stiffness, patella tracking problems, thromboembolic complications and other imponderables were discussed. The patient  acknowledged the explanation, agreed to proceed with the plan and consent was signed. Patient is being admitted for inpatient treatment for surgery, pain control, PT, OT, prophylactic antibiotics,  VTE prophylaxis, progressive ambulation and ADL's and discharge planning. The patient is planning to be discharged home with home health services

## 2014-08-23 ENCOUNTER — Encounter (HOSPITAL_COMMUNITY): Payer: Self-pay | Admitting: Internal Medicine

## 2014-08-23 ENCOUNTER — Inpatient Hospital Stay (HOSPITAL_COMMUNITY): Payer: Medicare PPO

## 2014-08-23 DIAGNOSIS — E119 Type 2 diabetes mellitus without complications: Secondary | ICD-10-CM

## 2014-08-23 DIAGNOSIS — N179 Acute kidney failure, unspecified: Secondary | ICD-10-CM | POA: Diagnosis present

## 2014-08-23 DIAGNOSIS — R140 Abdominal distension (gaseous): Secondary | ICD-10-CM

## 2014-08-23 DIAGNOSIS — E875 Hyperkalemia: Secondary | ICD-10-CM

## 2014-08-23 LAB — BASIC METABOLIC PANEL
ANION GAP: 8 (ref 5–15)
Anion gap: 10 (ref 5–15)
BUN: 24 mg/dL — ABNORMAL HIGH (ref 6–23)
BUN: 33 mg/dL — ABNORMAL HIGH (ref 6–23)
CALCIUM: 8.5 mg/dL (ref 8.4–10.5)
CALCIUM: 8.4 mg/dL (ref 8.4–10.5)
CO2: 27 mmol/L (ref 19–32)
CO2: 22 mmol/L (ref 19–32)
Chloride: 98 mEq/L (ref 96–112)
Chloride: 103 mEq/L (ref 96–112)
Creatinine, Ser: 1.6 mg/dL — ABNORMAL HIGH (ref 0.50–1.35)
Creatinine, Ser: 3.33 mg/dL — ABNORMAL HIGH (ref 0.50–1.35)
GFR calc Af Amer: 21 mL/min — ABNORMAL LOW (ref >90)
GFR calc non Af Amer: 43 mL/min — ABNORMAL LOW (ref >90)
GFR, EST AFRICAN AMERICAN: 50 mL/min — AB (ref >90)
GFR, EST NON AFRICAN AMERICAN: 18 mL/min — AB (ref >90)
Glucose, Bld: 268 mg/dL — ABNORMAL HIGH (ref 70–99)
Glucose, Bld: 224 mg/dL — ABNORMAL HIGH (ref 70–99)
POTASSIUM: 4.5 mmol/L (ref 3.5–5.1)
POTASSIUM: 5.4 mmol/L — AB (ref 3.5–5.1)
SODIUM: 135 mmol/L (ref 135–145)
Sodium: 133 mmol/L — ABNORMAL LOW (ref 135–145)

## 2014-08-23 LAB — GLUCOSE, CAPILLARY
GLUCOSE-CAPILLARY: 175 mg/dL — AB (ref 70–99)
Glucose-Capillary: 222 mg/dL — ABNORMAL HIGH (ref 70–99)
Glucose-Capillary: 184 mg/dL — ABNORMAL HIGH (ref 70–99)
Glucose-Capillary: 198 mg/dL — ABNORMAL HIGH (ref 70–99)

## 2014-08-23 LAB — CBC
HCT: 40.8 % (ref 39.0–52.0)
Hemoglobin: 13.9 g/dL (ref 13.0–17.0)
MCH: 29 pg (ref 26.0–34.0)
MCHC: 34.1 g/dL (ref 30.0–36.0)
MCV: 85 fL (ref 78.0–100.0)
PLATELETS: 203 10*3/uL (ref 150–400)
RBC: 4.8 MIL/uL (ref 4.22–5.81)
RDW: 13.9 % (ref 11.5–15.5)
WBC: 11.4 10*3/uL — AB (ref 4.0–10.5)

## 2014-08-23 MED ORDER — OXYCODONE-ACETAMINOPHEN 5-325 MG PO TABS: 1.0000 | ORAL_TABLET | ORAL | Status: DC | PRN

## 2014-08-23 MED ORDER — IOHEXOL 300 MG/ML  SOLN: 50.0000 mL | Freq: Once | INTRAMUSCULAR | Status: AC | PRN

## 2014-08-23 MED ORDER — DEXTROSE 50 % IV SOLN
12.5000 g | Freq: Once | INTRAVENOUS | Status: AC
  Administered 2014-08-23: 12.5 g via INTRAVENOUS
  Filled 2014-08-23: qty 50

## 2014-08-23 MED ORDER — INSULIN ASPART 100 UNIT/ML IV SOLN
10.0000 [IU] | Freq: Once | INTRAVENOUS | Status: AC
  Administered 2014-08-23: 10 [IU] via INTRAVENOUS

## 2014-08-23 MED ORDER — METHOCARBAMOL 500 MG PO TABS: 500.0000 mg | ORAL_TABLET | Freq: Four times a day (QID) | ORAL | Status: DC | PRN

## 2014-08-23 MED ORDER — SODIUM CHLORIDE 0.9 % IV BOLUS (SEPSIS)
1000.0000 mL | Freq: Once | INTRAVENOUS | Status: AC
  Administered 2014-08-23: 1000 mL via INTRAVENOUS

## 2014-08-23 MED ORDER — RIVAROXABAN 10 MG PO TABS: 10.0000 mg | ORAL_TABLET | Freq: Every day | ORAL | Status: DC

## 2014-08-23 NOTE — Discharge Instructions (Addendum)
Increase your activities as comfort allows. Work on knee motion. Ice as needed for swelling. You can get your current dressing wet daily in the shower. Leave your current dressing on and in place until your outpatient follow-up. Do take an over the counter stool softener as needed.  Information on my medicine - XARELTO (Rivaroxaban)  This medication education was reviewed with me or my healthcare representative as part of my discharge preparation.  The pharmacist that spoke with me during my hospital stay was:  Rollene FareWilliamson, Erin R, RPH  Why was Xarelto prescribed for you? Xarelto was prescribed for you to reduce the risk of blood clots forming after orthopedic surgery. The medical term for these abnormal blood clots is venous thromboembolism (VTE).  What do you need to know about xarelto ? Take your Xarelto ONCE DAILY at the same time every day. You may take it either with or without food.  If you have difficulty swallowing the tablet whole, you may crush it and mix in applesauce just prior to taking your dose.  Take Xarelto exactly as prescribed by your doctor and DO NOT stop taking Xarelto without talking to the doctor who prescribed the medication.  Stopping without other VTE prevention medication to take the place of Xarelto may increase your risk of developing a clot.  After discharge, you should have regular check-up appointments with your healthcare provider that is prescribing your Xarelto.    What do you do if you miss a dose? If you miss a dose, take it as soon as you remember on the same day then continue your regularly scheduled once daily regimen the next day. Do not take two doses of Xarelto on the same day.   Important Safety Information A possible side effect of Xarelto is bleeding. You should call your healthcare provider right away if you experience any of the following: ? Bleeding from an injury or your nose that does not stop. ? Unusual colored urine (red  or dark brown) or unusual colored stools (red or black). ? Unusual bruising for unknown reasons. ? A serious fall or if you hit your head (even if there is no bleeding).  Some medicines may interact with Xarelto and might increase your risk of bleeding while on Xarelto. To help avoid this, consult your healthcare provider or pharmacist prior to using any new prescription or non-prescription medications, including herbals, vitamins, non-steroidal anti-inflammatory drugs (NSAIDs) and supplements.  This website has more information on Xarelto: [email or web address].xarelto.[email or web address].   DO NOT TAKE XARELTO. TAKE 325 MG ASPIRIN TWICE DAILY INSTEAD

## 2014-08-23 NOTE — Progress Notes (Signed)
Paged Dr Roda ShuttersXu re pt's BP & u/o. Received call back & order to give NS IV bolus. Sharlee Rufino, [employer]

## 2014-08-23 NOTE — Evaluation (Signed)
Physical Therapy Evaluation Patient Details Name: Christopher Rasmussen MRN: [id number] DOB: 06-10-1946 Today's Date: 08/23/2014   History of Present Illness  L TKR  Clinical Impression  Pt s/p L TKR presents with decreased L LE strength/ROM, post op pain, obesity, and ltd ROM non-operative knee limiting functional mobility.  Pt should progress to d.c home with family assist and HHPT follow up.    Follow Up Recommendations Home health PT    Equipment Recommendations  Rolling walker with 5" wheels    Recommendations for Other Services OT consult     Precautions / Restrictions Precautions Precautions: Knee;Fall Required Braces or Orthoses: Knee Immobilizer - Left Knee Immobilizer - Left: Discontinue once straight leg raise with < 10 degree lag Restrictions Weight Bearing Restrictions: No Other Position/Activity Restrictions: WBAT      Mobility  Bed Mobility Overal bed mobility: +2 for physical assistance;Needs Assistance Bed Mobility: Supine to Sit     Supine to sit: Mod assist;+2 for physical assistance;+2 for safety/equipment     General bed mobility comments: cues for sequence and use of R LE to self assist  Transfers Overall transfer level: Needs assistance Equipment used: Rolling walker (2 wheeled) Transfers: Sit to/from Stand Sit to Stand: Mod assist;+2 physical assistance;+2 safety/equipment         General transfer comment: cues for LE management and use of UEs to self assist  Ambulation/Gait Ambulation/Gait assistance: Min assist;Mod assist;+2 physical assistance;+2 safety/equipment Ambulation Distance (Feet): 10 Feet Assistive device: Rolling walker (2 wheeled) Gait Pattern/deviations: Step-to pattern;Decreased step length - right;Decreased step length - left;Shuffle;Antalgic;Trunk flexed Gait velocity: decr   General Gait Details: cues for sequence, posture and position from RW.  Difficulty advancing R LE.  Distance ltd by c/o dizziness with BP  138/60  Stairs            Wheelchair Mobility    Modified Rankin (Stroke Patients Only)       Balance                                             Pertinent Vitals/Pain Pain Assessment: 0-10 Pain Score: 5  Pain Location: L knee Pain Descriptors / Indicators: Aching;Sore Pain Intervention(s): Limited activity within patient's tolerance;Monitored during session;Premedicated before session;Ice applied    Home Living Family/patient expects to be discharged to:: Private residence Living Arrangements: Spouse/significant other Available Help at Discharge: Family Type of Home: House Home Access: Stairs to enter Entrance Stairs-Rails: Doctor, general practice of Steps: 4 Home Layout: One level;Able to live on main level with bedroom/bathroom Home Equipment: None      Prior Function Level of Independence: Independent               Hand Dominance   Dominant Hand: Right    Extremity/Trunk Assessment   Upper Extremity Assessment: Overall WFL for tasks assessed           Lower Extremity Assessment: RLE deficits/detail;LLE deficits/detail RLE Deficits / Details: Strength WFL with AROM ltd to -15 - 70 LLE Deficits / Details: 2/5 quads with AAROM at knee -15 - 45  Cervical / Trunk Assessment: Normal  Communication   Communication: No difficulties  Cognition Arousal/Alertness: Awake/alert Behavior During Therapy: WFL for tasks assessed/performed Overall Cognitive Status: Within Functional Limits for tasks assessed  General Comments      Exercises Total Joint Exercises Ankle Circles/Pumps: AROM;Both;10 reps;Supine Quad Sets: AROM;Both;10 reps;Supine Heel Slides: AAROM;Left;10 reps;Supine Straight Leg Raises: AAROM;Left;10 reps;Supine      Assessment/Plan    PT Assessment Patient needs continued PT services  PT Diagnosis Difficulty walking   PT Problem List Decreased strength;Decreased range  of motion;Decreased activity tolerance;Decreased mobility;Decreased knowledge of use of DME;Pain;Obesity  PT Treatment Interventions DME instruction;Gait training;Stair training;Functional mobility training;Therapeutic activities;Therapeutic exercise;Patient/family education   PT Goals (Current goals can be found in the Care Plan section) Acute Rehab PT Goals Patient Stated Goal: Resume previous lifestyle with decreased pain PT Goal Formulation: With patient Time For Goal Achievement: 08/30/14 Potential to Achieve Goals: Good    Frequency 7X/week   Barriers to discharge        Co-evaluation               End of Session Equipment Utilized During Treatment: Gait belt;Left knee immobilizer Activity Tolerance: Patient limited by fatigue;Other (comment) (c/o dizziness) Patient left: in chair;with call bell/phone within reach;with family/visitor present Nurse Communication: Mobility status         Time: [id number] PT Time Calculation (min) (ACUTE ONLY): 42 min   Charges:   PT Evaluation $Initial PT Evaluation Tier I: 1 Procedure PT Treatments $Gait Training: 8-22 mins $Therapeutic Exercise: 8-22 mins   PT G Codes:        Raji Glinski 08/23/2014, 1:12 PM

## 2014-08-23 NOTE — Consult Note (Signed)
PCP: Aida Puffer, MD  Cardiology [facility]  Chief Complaint:  No urine output   Consult requested by Dr. Roda Shutters HPI: Christopher Rasmussen is a 69 y.o. male   has a past medical history of Hypertension; High cholesterol; Type II diabetes mellitus; and Arthritis.   Reason for consult decreased urine output. Patient was admitted to orthopedic service for left knee replacement done on 08/22/2014 secondary to degenerative joint disease of the left knee. Has history of diabetes, and hypertension. Patient has been on Ramipril. Last Cr was 0.56 on 08/15/14 this AM Cr was 1.6 with K 4.5, BUN 24, over the past 24 hour patient have had decreased urine out put down to 50 ml over past 24 hours. Day before was 575 total.  Of note patient postoperatively received  Toradol 30 mg q 6 hours.  No BM x 48 hours, abdominal distention and fullness. No nausea no vomiting. He passed large gas at 4 AM but not since. Have had minimal PO intake. Bladder scan was done showing no urine.  Given 1L  NS saline bolus x 2 with no increase in urine output. Some dark apearing urine in the foley bag noted. Patietn reports prior to hospitalization he have had 2 day worth of diarrhea. That has currently resolved.  Hospitalist was called for consult for oliguria.   Review of Systems:    Pertinent positives include:  Diarrhea, no BM <48 h , no flatus <12 hours, abdominal fullness  Constitutional:  No weight loss, night sweats, Fevers, chills, fatigue, weight loss  HEENT:  No headaches, Difficulty swallowing,Tooth/dental problems,Sore throat,  No sneezing, itching, ear ache, nasal congestion, post nasal drip,  Cardio-vascular:  No chest pain, Orthopnea, PND, anasarca, dizziness, palpitations.no Bilateral lower extremity swelling  GI:  No heartburn, indigestion, abdominal pain, nausea, vomiting,  change in bowel habits, loss of appetite, melena, blood in stool, hematemesis Resp:  no shortness of breath at rest. No dyspnea on exertion, No  excess mucus, no productive cough, No non-productive cough, No coughing up of blood.No change in color of mucus.No wheezing. Skin:  no rash or lesions. No jaundice GU:  no dysuria, change in color of urine, no urgency or frequency. No straining to urinate.  No flank pain.  Musculoskeletal:  No joint pain or no joint swelling. No decreased range of motion. No back pain.  Psych:  No change in mood or affect. No depression or anxiety. No memory loss.  Neuro: no localizing neurological complaints, no tingling, no weakness, no double vision, no gait abnormality, no slurred speech, no confusion  Otherwise ROS are negative except for above, 10 systems were reviewed  Past Medical History: Past Medical History  Diagnosis Date  . Hypertension   . High cholesterol   . Type II diabetes mellitus   . Arthritis    Past Surgical History  Procedure Laterality Date  . Knee arthroscopy Left 1990's     Medications: Prior to Admission medications   Medication Sig Start Date End Date Taking? Authorizing Provider  amLODipine (NORVASC) 10 MG tablet Take 10 mg by mouth every morning.    Yes Historical Provider, MD  aspirin 325 MG tablet Take 325 mg by mouth daily.   Yes Historical Provider, MD  hydrochlorothiazide (MICROZIDE) 12.5 MG capsule Take 12.5 mg by mouth daily.   Yes Historical Provider, MD  insulin aspart (NOVOLOG) 100 UNIT/ML injection Inject 10 Units into the skin daily with lunch.   Yes Historical Provider, MD  insulin aspart protamine- aspart (NOVOLOG MIX  70/30) (70-30) 100 UNIT/ML injection Inject 30 Units into the skin every morning.   Yes Historical Provider, MD  metFORMIN (GLUCOPHAGE) 1000 MG tablet Take 1,000 mg by mouth 2 (two) times daily with a meal.   Yes Historical Provider, MD  metoprolol succinate (TOPROL-XL) 50 MG 24 hr tablet Take 50 mg by mouth at bedtime. Take with or immediately following a meal.   Yes Historical Provider, MD  ramipril (ALTACE) 10 MG tablet Take 1 tablet  (10 mg total) by mouth 2 (two) times daily. Patient taking differently: Take 10 mg by mouth daily.  02/23/13  Yes Drema Dallasurtis J Woods, MD  simvastatin (ZOCOR) 10 MG tablet Take 10 mg by mouth at bedtime.   Yes Historical Provider, MD  methocarbamol (ROBAXIN) 500 MG tablet Take 1 tablet (500 mg total) by mouth every 6 (six) hours as needed for muscle spasms. 08/23/14   Kathryne Hitchhristopher Y Blackman, MD  oxyCODONE-acetaminophen (ROXICET) 5-325 MG per tablet Take 1-2 tablets by mouth every 4 (four) hours as needed. 08/23/14   Kathryne Hitchhristopher Y Blackman, MD  rivaroxaban (XARELTO) 10 MG TABS tablet Take 1 tablet (10 mg total) by mouth daily with breakfast. 08/23/14   Kathryne Hitchhristopher Y Blackman, MD    Allergies:  No Known Allergies  Social History:  Ambulatory  Independently Lives at home With family     reports that he has never smoked. He has never used smokeless tobacco. He reports that he does not drink alcohol or use illicit drugs.    Family History: family history includes CAD in his father; Stroke in his mother.    Physical Exam: Patient Vitals for the past 24 hrs:  BP Temp Temp src Pulse Resp SpO2  08/23/14 2050 (!) 107/56 mmHg 98.4 F (36.9 C) Oral 81 18 99 %  08/23/14 1600 - - - - 16 96 %  08/23/14 1452 (!) 126/91 mmHg 97.9 F (36.6 C) Oral 76 16 96 %  08/23/14 1200 - - - - 16 94 %  08/23/14 1150 (!) 138/59 mmHg 97.8 F (36.6 C) Oral 88 16 94 %  08/23/14 0800 - - - - 16 97 %  08/23/14 0509 126/66 mmHg 98.6 F (37 C) Oral 92 16 97 %  08/23/14 0400 - - - - 16 95 %  08/23/14 0133 (!) 145/75 mmHg 97.8 F (36.6 C) Oral 88 16 96 %  08/23/14 0000 - - - - 16 94 %  08/22/14 2216 (!) 147/85 mmHg 98.2 F (36.8 C) Oral 93 15 97 %    1. General:  in No Acute distress 2. Psychological: Alert and Oriented 3. Head/ENT:     Dry Mucous Membranes                          Head Non traumatic, neck supple                          Normal  Dentition 4. SKIN: normal   Skin turgor,  Skin clean Dry and intact no  rash 5. Heart: Regular rate and rhythm systolic Murmur noted , Rub or gallop 6. Lungs: Clear to auscultation bilaterally, no wheezes or crackles   7. Abdomen:   non-tender,  Distended hyperactive sounds 8. Lower extremities: no clubbing, cyanosis, or edema, left knee in dressing 9. Neurologically Grossly intact, moving all 4 extremities equally 10. MSK: Normal range of motion did not test left knee  body mass index is 35.72  kg/(m^2).   Labs on Admission:   Results for orders placed or performed during the hospital encounter of 08/22/14 (from the past 24 hour(s))  CBC     Status: Abnormal   Collection Time: 08/23/14  4:56 AM  Result Value Ref Range   WBC 11.4 (H) 4.0 - 10.5 K/uL   RBC 4.80 4.22 - 5.81 MIL/uL   Hemoglobin 13.9 13.0 - 17.0 g/dL   HCT 16.1 09.6 - 04.5 %   MCV 85.0 78.0 - 100.0 fL   MCH 29.0 26.0 - 34.0 pg   MCHC 34.1 30.0 - 36.0 g/dL   RDW 40.9 81.1 - 91.4 %   Platelets 203 150 - 400 K/uL  Basic metabolic panel     Status: Abnormal   Collection Time: 08/23/14  4:56 AM  Result Value Ref Range   Sodium 133 (L) 135 - 145 mmol/L   Potassium 4.5 3.5 - 5.1 mmol/L   Chloride 98 96 - 112 mEq/L   CO2 27 19 - 32 mmol/L   Glucose, Bld 268 (H) 70 - 99 mg/dL   BUN 24 (H) 6 - 23 mg/dL   Creatinine, Ser 7.82 (H) 0.50 - 1.35 mg/dL   Calcium 8.5 8.4 - 95.6 mg/dL   GFR calc non Af Amer 43 (L) >90 mL/min   GFR calc Af Amer 50 (L) >90 mL/min   Anion gap 8 5 - 15  Glucose, capillary     Status: Abnormal   Collection Time: 08/23/14  7:46 AM  Result Value Ref Range   Glucose-Capillary 222 (H) 70 - 99 mg/dL  Glucose, capillary     Status: Abnormal   Collection Time: 08/23/14 12:59 PM  Result Value Ref Range   Glucose-Capillary 184 (H) 70 - 99 mg/dL  Glucose, capillary     Status: Abnormal   Collection Time: 08/23/14  5:28 PM  Result Value Ref Range   Glucose-Capillary 175 (H) 70 - 99 mg/dL   Comment 1 Notify RN   Glucose, capillary     Status: Abnormal   Collection Time:  08/23/14  8:56 PM  Result Value Ref Range   Glucose-Capillary 198 (H) 70 - 99 mg/dL   Comment 1 Notify RN   Basic metabolic panel     Status: Abnormal   Collection Time: 08/23/14  9:38 PM  Result Value Ref Range   Sodium 135 135 - 145 mmol/L   Potassium 5.4 (H) 3.5 - 5.1 mmol/L   Chloride 103 96 - 112 mEq/L   CO2 22 19 - 32 mmol/L   Glucose, Bld 224 (H) 70 - 99 mg/dL   BUN 33 (H) 6 - 23 mg/dL   Creatinine, Ser 2.13 (H) 0.50 - 1.35 mg/dL   Calcium 8.4 8.4 - 08.6 mg/dL   GFR calc non Af Amer 18 (L) >90 mL/min   GFR calc Af Amer 21 (L) >90 mL/min   Anion gap 10 5 - 15    UA will order  Lab Results  Component Value Date   HGBA1C 6.8* 02/22/2013    Estimated Creatinine Clearance: 26.3 mL/min (by C-G formula based on Cr of 3.33).  BNP (last 3 results) No results for input(s): PROBNP in the last 8760 hours.  Other results:  I have pearsonaly reviewed this: ECG REPORT 08/15/14 Rate:83  Rhythm: NSR ST&T Change: no ischemia   Filed Weights   08/22/14 0819 08/22/14 1415  Weight: 109.77 kg (242 lb) 109.77 kg (242 lb)     Cultures: No results found for: SDES,  SPECREQUEST, CULT, REPTSTATUS   Radiological Exams on Admission: Dg Abd 1 View  08/23/2014   CLINICAL DATA:  Generalized abdominal distention, with bloating and constipation. Initial encounter.  EXAM: ABDOMEN - 1 VIEW  COMPARISON:  None.  FINDINGS: There is mild distention of the stomach and much of the colon with air, resolving at the mid descending colon. The small bowel is grossly unremarkable in appearance. CT of the abdomen and pelvis would be helpful for further evaluation, to exclude an underlying mass at the descending colon.  No free intra-abdominal air is identified, though evaluation for free air is limited on supine views. No acute osseous abnormalities are identified.  IMPRESSION: Mild distention of the stomach and much of the colon with air, resolving at the mid descending colon. The small bowel is grossly  unremarkable. CT of the abdomen and pelvis would be helpful for further evaluation, to exclude an underlying mass at the descending colon.  Alternatively, this could reflect focal diverticulitis at the descending colon, or could simply be transient in nature.   Electronically Signed   By: Roanna Raider M.D.   On: 08/23/2014 22:21   Dg Knee Left Port  08/22/2014   CLINICAL DATA:  Post left total knee replacement  EXAM: PORTABLE LEFT KNEE - 1-2 VIEW  COMPARISON:  None.  FINDINGS: Two views of the left knee submitted. There is left knee prosthesis in anatomic alignment. Postsurgical changes are noted with periarticular soft tissue air.  IMPRESSION: Left knee prosthesis in anatomic alignment.   Electronically Signed   By: Natasha Mead M.D.   On: 08/22/2014 13:33    Chart has been reviewed  Assessment/Plan  69 year old male with history of diabetes and hypertension status post left knee replacement on January 15. Was noted to be oliguric over past 24 hours with lab work showing acute renal failure, poorly responding to fluid challenge.  Noted to have abdominal distention clinically suspicious for ileus CT of abdomen pending  Present on Admission:  . Acute renal failure with oliguria with minimal response to IV fluids. Stop ramipril and HCTZ, avoid renotoxic meds, obtain urine, urine electrolytes, renal US ordered  renal consult called. Dr. Arlean Hopping have seen the patient, suspect ATN, transfer to [facility] stepdown, no acute HD indication for tonight. Transfer to [facility] if HD needed. [facility] hospitalist will take on our service.  Ordered CK, lactic acid . HTN (hypertension) - hold ACEi given acute renal failure, dc norvasc  . Abdominal distension - obtained KUB showing stomach and colon distention. ? Diverticulitis vs colonic obstruction. CT abdomen with PO contrast ordered. DM - continue SSI, hold metformin  hyperkalemia - treat with IV insulin and D25 will repeat  CODE STATUS:  FULL CODE    Other plan as per  orders.  I have spent a total of 120 min on this consult additional time spent discussing care with radiology, nephrology, pharmacy and orthopedics  Darryel Diodato 08/23/2014, 9:53 PM  [facility]  Pager [phone]   after 2 AM please page floor coverage PA If 7AM-7PM, please contact the day team taking care of the patient  [email or web address]  Password [email or web address]

## 2014-08-23 NOTE — Progress Notes (Signed)
Physical Therapy Treatment Patient Details Name: Christopher Rasmussen MRN: [id number] DOB: 10/22/1945 Today's Date: 08/23/2014    History of Present Illness L TKR    PT Comments    Pt progressing slowly.  Limited this pm by difficulty following cues with c/o "just feeling so out of it from the pain meds"  Follow Up Recommendations  Home health PT     Equipment Recommendations  Rolling walker with 5" wheels    Recommendations for Other Services OT consult     Precautions / Restrictions Precautions Precautions: Knee;Fall Required Braces or Orthoses: Knee Immobilizer - Left Knee Immobilizer - Left: Discontinue once straight leg raise with < 10 degree lag Restrictions Weight Bearing Restrictions: No Other Position/Activity Restrictions: WBAT    Mobility  Bed Mobility   Bed Mobility: Sit to Supine       Sit to supine: Min assist;Mod assist   General bed mobility comments: cues for sequence and use of R LE to self assist  Transfers Overall transfer level: Needs assistance Equipment used: Rolling walker (2 wheeled) Transfers: Sit to/from Stand Sit to Stand: Mod assist;+2 physical assistance;+2 safety/equipment         General transfer comment: cues for LE management and use of UEs to self assist  Ambulation/Gait Ambulation/Gait assistance: Mod assist;+2 physical assistance;+2 safety/equipment Ambulation Distance (Feet): 5 Feet Assistive device: Rolling walker (2 wheeled) Gait Pattern/deviations: Step-to pattern;Decreased step length - right;Decreased step length - left;Shuffle;Trunk flexed Gait velocity: decr   General Gait Details: Step-by-step cues for sequence, posture and position from RW.     Stairs            Wheelchair Mobility    Modified Rankin (Stroke Patients Only)       Balance                                    Cognition Arousal/Alertness: Awake/alert Behavior During Therapy: WFL for tasks assessed/performed Overall  Cognitive Status: Within Functional Limits for tasks assessed                      Exercises      General Comments        Pertinent Vitals/Pain Pain Assessment: 0-10 Pain Score: 4  Pain Location: L knee Pain Descriptors / Indicators: Aching;Sore Pain Intervention(s): Limited activity within patient's tolerance    Home Living                      Prior Function            PT Goals (current goals can now be found in the care plan section) Acute Rehab PT Goals Patient Stated Goal: Resume previous lifestyle with decreased pain PT Goal Formulation: With patient Time For Goal Achievement: 08/30/14 Potential to Achieve Goals: Good Progress towards PT goals: Progressing toward goals    Frequency  7X/week    PT Plan Current plan remains appropriate    Co-evaluation             End of Session Equipment Utilized During Treatment: Gait belt;Left knee immobilizer Activity Tolerance: Patient limited by fatigue Patient left: in bed;with call bell/phone within reach;with family/visitor present     Time: 4098-1191 PT Time Calculation (min) (ACUTE ONLY): 21 min  Charges:  $Gait Training: 8-22 mins  G Codes:      Christopher Rasmussen 08/23/2014, 5:01 PM

## 2014-08-23 NOTE — Progress Notes (Signed)
OT Cancellation Note  Patient Details Name: Christopher Rasmussen B Rabenold MRN: [id number] DOB: July 29, 1946   Cancelled Treatment:    Reason Eval/Treat Not Completed: Other (comment)Pt dizzy with PT.  OT will see pt morning prior to DC  [facility]ING, Metro KungLorraine D 08/23/2014, 11:57 AM

## 2014-08-23 NOTE — Progress Notes (Signed)
Pt remains with no additional u/o. Dr Roda ShuttersXu notified by phone & order received to give additional IV bolus. Laurens Matheny, Bed Bath [employer] Beyondaylor

## 2014-08-23 NOTE — Progress Notes (Signed)
PHARMACIST - PHYSICIAN COMMUNICATION DR:  Magnus IvanBlackman CONCERNING:  METFORMIN SAFE ADMINISTRATION POLICY  RECOMMENDATION: Metformin has been placed on DISCONTINUE (rejected order) STATUS and should be reordered only after any of the conditions below are ruled out.  Current safety recommendations include avoiding metformin for a minimum of 48 hours after the patient's exposure to intravenous contrast media.  DESCRIPTION:  The Pharmacy Committee has adopted a policy that restricts the use of metformin in hospitalized patients until all the contraindications to administration have been ruled out. Specific contraindications are: [x]  Serum creatinine ? 1.5 for males []  Serum creatinine ? 1.4 for females []  Shock, acute MI, sepsis, hypoxemia, dehydration []  Planned administration of intravenous iodinated contrast media []  Heart Failure patients with low EF []  Acute or chronic metabolic acidosis (including DKA)     Thank you, Loralee PacasErin Sinia Antosh, PharmD, BCPS 08/23/2014 7:54 AM

## 2014-08-23 NOTE — Care Management (Signed)
CARE MANAGEMENT NOTE 08/23/2014  Patient:  Christopher Rasmussen,Christopher Rasmussen   Account Number:  [id number]  Date Initiated:  08/23/2014  Documentation initiated by:  Antony HasteBENNETT,Lakin Rhine HARRIS  Subjective/Objective Assessment:   Left total knee arthroplasty     Action/Plan:   Anticipated DC Date:     Anticipated DC Plan:  HOME W HOME HEALTH SERVICES      DC Planning Services  CM consult      [facility]  DURABLE MEDICAL EQUIPMENT   Choice offered to / List presented to:     DME arranged  3-N-1  WALKER - ROLLING        HH arranged  HH[facility]2 PT      [facility]   Status of service:  Completed, signed off Medicare Important Message given?   (If response is "NO", the following Medicare IM given date fields will be blank) Date Medicare IM given:   Medicare IM given by:   Date Additional Medicare IM given:   Additional Medicare IM given by:    Discharge Disposition:  HOME W HOME HEALTH SERVICES  Per UR Regulation:    If discussed at Long Length of Stay Meetings, dates discussed:    Comments:  08/23/14 1145 - CM spoke with patient. Has selected [facility] for HHPT. Needs a rolling walker and 3N1. Would like to use [facility] for DME needs. Contacted [facility] and faxed the orders and H&P per their request. Rubie Maidrystal Anmol Fleck RN BSN CCM [phone]

## 2014-08-23 NOTE — Progress Notes (Signed)
   Subjective:  Patient reports pain as moderate.  Felt slightly dizzy this am.  Objective:   VITALS:   Filed Vitals:   08/23/14 0000 08/23/14 0133 08/23/14 0400 08/23/14 0509  BP:  145/75  126/66  Pulse:  88  92  Temp:  97.8 F (36.6 C)  98.6 F (37 C)  TempSrc:  Oral  Oral  Resp: 16 16 16 16   Height:      Weight:      SpO2: 94% 96% 95% 97%    Neurologically intact Neurovascular intact Sensation intact distally Intact pulses distally Dorsiflexion/Plantar flexion intact Incision: dressing C/D/I and no drainage No cellulitis present Compartment soft   Lab Results  Component Value Date   WBC 11.4* 08/23/2014   HGB 13.9 08/23/2014   HCT 40.8 08/23/2014   MCV 85.0 08/23/2014   PLT 203 08/23/2014     Assessment/Plan:  1 Day Post-Op   - dizziness likely 2/2 to pain meds - will monitor - Expected postop acute blood loss anemia - will monitor for symptoms - Up with PT/OT - DVT ppx - SCDs, ambulation, xarelto - WBAT left lower extremity - Pain control - Discharge planning  Cheral AlmasXu, Naiping Michael 08/23/2014, 9:12 AM [phone]

## 2014-08-23 NOTE — Consult Note (Signed)
Renal Service Consult Note [facility]  Christopher Rasmussen 08/23/2014 Delano Metz D Requesting Physician:  Dr Magnus Ivan  Reason for Consult:  Acute renal failure HPI: The patient is a 69 y.o. year-old with hx of HTN, DM and HL has DJD and was admitted for elective left total knee replacement. He had the surgery yesterday on 1/15. Today UOP dropped off and creatinine rising.  Is + 8 Liters and still minimal UOP . Distended abd , patient w/o many complaints, awake and alert.  BP's stable, no hypotensive episodes. Has foley in place.    Inpatient meds received > oxycodone, robaxin, dilaudid, tylenol, tranexamic acid 1 gm IV, zocor, rivaroxaban, ramipril, oxycontin, metoprolol, metformin, Toradol x 4 IV, insulin, HTCZ, colace, Ancef, norvasc and IV tylenol   Chart review: July 2014 > chest pain, hx HTN, DM, HL> hx recently normal stress test 4 mos ago. Ruled out and sent home.    ROS  denies SOB, CP, abd pain, n/v/d  no rash  no HA  no hx renal failure  Past Medical History  Past Medical History  Diagnosis Date  . Hypertension   . High cholesterol   . Type II diabetes mellitus   . Arthritis    Past Surgical History  Past Surgical History  Procedure Laterality Date  . Knee arthroscopy Left 1990's   Family History  Family History  Problem Relation Age of Onset  . CAD Father   . Stroke Mother    Social History  reports that he has never smoked. He has never used smokeless tobacco. He reports that he does not drink alcohol or use illicit drugs. Allergies No Known Allergies Home medications Prior to Admission medications   Medication Sig Start Date End Date Taking? Authorizing Provider  amLODipine (NORVASC) 10 MG tablet Take 10 mg by mouth every morning.    Yes Historical Provider, MD  aspirin 325 MG tablet Take 325 mg by mouth daily.   Yes Historical Provider, MD  hydrochlorothiazide (MICROZIDE) 12.5 MG capsule Take 12.5 mg by mouth daily.   Yes Historical  Provider, MD  insulin aspart (NOVOLOG) 100 UNIT/ML injection Inject 10 Units into the skin daily with lunch.   Yes Historical Provider, MD  insulin aspart protamine- aspart (NOVOLOG MIX 70/30) (70-30) 100 UNIT/ML injection Inject 30 Units into the skin every morning.   Yes Historical Provider, MD  metFORMIN (GLUCOPHAGE) 1000 MG tablet Take 1,000 mg by mouth 2 (two) times daily with a meal.   Yes Historical Provider, MD  metoprolol succinate (TOPROL-XL) 50 MG 24 hr tablet Take 50 mg by mouth at bedtime. Take with or immediately following a meal.   Yes Historical Provider, MD  ramipril (ALTACE) 10 MG tablet Take 1 tablet (10 mg total) by mouth 2 (two) times daily. Patient taking differently: Take 10 mg by mouth daily.  02/23/13  Yes Drema Dallas, MD  simvastatin (ZOCOR) 10 MG tablet Take 10 mg by mouth at bedtime.   Yes Historical Provider, MD  methocarbamol (ROBAXIN) 500 MG tablet Take 1 tablet (500 mg total) by mouth every 6 (six) hours as needed for muscle spasms. 08/23/14   Kathryne Hitch, MD  oxyCODONE-acetaminophen (ROXICET) 5-325 MG per tablet Take 1-2 tablets by mouth every 4 (four) hours as needed. 08/23/14   Kathryne Hitch, MD  rivaroxaban (XARELTO) 10 MG TABS tablet Take 1 tablet (10 mg total) by mouth daily with breakfast. 08/23/14   Kathryne Hitch, MD   Liver Function Tests No results  for input(s): AST, ALT, ALKPHOS, BILITOT, PROT, ALBUMIN in the last 168 hours. No results for input(s): LIPASE, AMYLASE in the last 168 hours. CBC  Recent Labs Lab 08/23/14 0456  WBC 11.4*  HGB 13.9  HCT 40.8  MCV 85.0  PLT 203   Basic Metabolic Panel  Recent Labs Lab 08/23/14 0456 08/23/14 2138  NA 133* 135  K 4.5 5.4*  CL 98 103  CO2 27 22  GLUCOSE 268* 224*  BUN 24* 33*  CREATININE 1.60* 3.33*  CALCIUM 8.5 8.4    Filed Vitals:   08/23/14 1200 08/23/14 1452 08/23/14 1600 08/23/14 2050  BP:  126/91  107/56  Pulse:  76  81  Temp:  97.9 F (36.6 C)  98.4 F  (36.9 C)  TempSrc:  Oral  Oral  Resp: 16 16 16 18   Height:      Weight:      SpO2: 94% 96% 96% 99%   Exam Alert, older adult male lying 30 deg in no distress, calm No rash, cyanosis or gangrene Sclera anicteric, throat clear and slightly dry No jvd, flat neck veins Chest clear throughout RRR 2/6 SEM no RG Abd distended, decreased BS throughout, rare tinkle, no rushes, nontender and firm GU with foley in place, minimal cloudy yellowish urine LE pitting 1+ pretib edema bilat, brawny skin changes of legs Neuro is nf, Ox 3, no distress  UA by undersigned (1/16, 11:30 pm) > 4+ blood, neg LE/nit, 1+ protein, diffuse granular casts, 3-4+ nondysmorphic rbc's, no sig WBC's   Assessment: 1. Acute renal failure - patient has ATN clinically with diffuse granular casts. Also has significant microhematuria, not sure significance.  IV Toradol / Ace inhibitors can contribute to AKI but usually do not cause ATN in and of themselves without something else contributing (typically hypotension/ hypovolemia, neither of which he has).  Tranexamic acid has been reported in case reports to cause AKI / acute cortical necrosis due to intraglomerular thrombosis.  Plan for now is supportive care, no NSAID's , acei stopped.  BP's are normal, will continue IVF"s at 75cc/hr for now.  Hold norvasc and MTP.  He does have LE edema but clear lungs.  Will send off serologies 2. Hyperkalemia -from AKI, change to renal diet 3. S/P left TKR POD #1 4. HTN 5. DM2 6. Lower ext edema -mild and chronic, would check ECHO for LV function   Plan - as above  Vinson Moselleob Wong Steadham MD (pgr) [phone]    [phone] 08/23/2014, 11:32 PM

## 2014-08-24 ENCOUNTER — Inpatient Hospital Stay (HOSPITAL_COMMUNITY): Payer: Medicare PPO

## 2014-08-24 DIAGNOSIS — K913 Postprocedural intestinal obstruction: Secondary | ICD-10-CM

## 2014-08-24 DIAGNOSIS — K567 Ileus, unspecified: Secondary | ICD-10-CM | POA: Insufficient documentation

## 2014-08-24 DIAGNOSIS — Z96652 Presence of left artificial knee joint: Secondary | ICD-10-CM

## 2014-08-24 DIAGNOSIS — K9189 Other postprocedural complications and disorders of digestive system: Secondary | ICD-10-CM

## 2014-08-24 DIAGNOSIS — I10 Essential (primary) hypertension: Secondary | ICD-10-CM

## 2014-08-24 DIAGNOSIS — E1169 Type 2 diabetes mellitus with other specified complication: Secondary | ICD-10-CM

## 2014-08-24 DIAGNOSIS — M129 Arthropathy, unspecified: Secondary | ICD-10-CM

## 2014-08-24 DIAGNOSIS — N170 Acute kidney failure with tubular necrosis: Secondary | ICD-10-CM | POA: Insufficient documentation

## 2014-08-24 LAB — URINE MICROSCOPIC-ADD ON

## 2014-08-24 LAB — DIFFERENTIAL
Basophils Absolute: 0 10*3/uL (ref 0.0–0.1)
Basophils Relative: 0 % (ref 0–1)
EOS PCT: 1 % (ref 0–5)
Eosinophils Absolute: 0.1 10*3/uL (ref 0.0–0.7)
Lymphocytes Relative: 15 % (ref 12–46)
Lymphs Abs: 1.6 10*3/uL (ref 0.7–4.0)
MONO ABS: 1.4 10*3/uL — AB (ref 0.1–1.0)
MONOS PCT: 13 % — AB (ref 3–12)
Neutro Abs: 7.4 10*3/uL (ref 1.7–7.7)
Neutrophils Relative %: 71 % (ref 43–77)

## 2014-08-24 LAB — CBC
HCT: 35.9 % — ABNORMAL LOW (ref 39.0–52.0)
HEMOGLOBIN: 12.2 g/dL — AB (ref 13.0–17.0)
MCH: 29 pg (ref 26.0–34.0)
MCHC: 34 g/dL (ref 30.0–36.0)
MCV: 85.3 fL (ref 78.0–100.0)
Platelets: 166 10*3/uL (ref 150–400)
RBC: 4.21 MIL/uL — ABNORMAL LOW (ref 4.22–5.81)
RDW: 14.2 % (ref 11.5–15.5)
WBC: 10.4 10*3/uL (ref 4.0–10.5)

## 2014-08-24 LAB — BASIC METABOLIC PANEL
Anion gap: 12 (ref 5–15)
Anion gap: 8 (ref 5–15)
Anion gap: 9 (ref 5–15)
Anion gap: 8 (ref 5–15)
BUN: 34 mg/dL — AB (ref 6–23)
BUN: 31 mg/dL — ABNORMAL HIGH (ref 6–23)
BUN: 26 mg/dL — ABNORMAL HIGH (ref 6–23)
BUN: 19 mg/dL (ref 6–23)
CALCIUM: 8.4 mg/dL (ref 8.4–10.5)
CALCIUM: 8.5 mg/dL (ref 8.4–10.5)
CO2: 23 mmol/L (ref 19–32)
CO2: 25 mmol/L (ref 19–32)
CO2: 24 mmol/L (ref 19–32)
CO2: 23 mmol/L (ref 19–32)
CREATININE: 0.8 mg/dL (ref 0.50–1.35)
Calcium: 8.5 mg/dL (ref 8.4–10.5)
Calcium: 9 mg/dL (ref 8.4–10.5)
Chloride: 100 mEq/L (ref 96–112)
Chloride: 102 mEq/L (ref 96–112)
Chloride: 102 mEq/L (ref 96–112)
Chloride: 105 mEq/L (ref 96–112)
Creatinine, Ser: 2.12 mg/dL — ABNORMAL HIGH (ref 0.50–1.35)
Creatinine, Ser: 1.26 mg/dL (ref 0.50–1.35)
Creatinine, Ser: 1.03 mg/dL (ref 0.50–1.35)
GFR calc Af Amer: 35 mL/min — ABNORMAL LOW (ref >90)
GFR calc Af Amer: 66 mL/min — ABNORMAL LOW (ref >90)
GFR calc Af Amer: >90 mL/min (ref >90)
GFR calc non Af Amer: 31 mL/min — ABNORMAL LOW (ref >90)
GFR calc non Af Amer: 73 mL/min — ABNORMAL LOW (ref >90)
GFR, EST AFRICAN AMERICAN: 85 mL/min — AB (ref >90)
GFR, EST NON AFRICAN AMERICAN: 57 mL/min — AB (ref >90)
GLUCOSE: 204 mg/dL — AB (ref 70–99)
GLUCOSE: 186 mg/dL — AB (ref 70–99)
Glucose, Bld: 191 mg/dL — ABNORMAL HIGH (ref 70–99)
Glucose, Bld: 161 mg/dL — ABNORMAL HIGH (ref 70–99)
Potassium: 4 mmol/L (ref 3.5–5.1)
Potassium: 4 mmol/L (ref 3.5–5.1)
Potassium: 3.6 mmol/L (ref 3.5–5.1)
Potassium: 4 mmol/L (ref 3.5–5.1)
SODIUM: 135 mmol/L (ref 135–145)
Sodium: 135 mmol/L (ref 135–145)
Sodium: 135 mmol/L (ref 135–145)
Sodium: 136 mmol/L (ref 135–145)

## 2014-08-24 LAB — OSMOLALITY, URINE: Osmolality, Ur: 352 mOsm/kg — ABNORMAL LOW (ref 390–1090)

## 2014-08-24 LAB — URINALYSIS, ROUTINE W REFLEX MICROSCOPIC
GLUCOSE, UA: NEGATIVE mg/dL
KETONES UR: NEGATIVE mg/dL
NITRITE: NEGATIVE
PH: 5 (ref 5.0–8.0)
PROTEIN: 30 mg/dL — AB
SPECIFIC GRAVITY, URINE: 1.023 (ref 1.005–1.030)
UROBILINOGEN UA: 0.2 mg/dL (ref 0.0–1.0)

## 2014-08-24 LAB — CREATININE, URINE, RANDOM: CREATININE, URINE: 359.2 mg/dL

## 2014-08-24 LAB — TROPONIN I
Troponin I: <0.03 ng/mL (ref <0.031)
Troponin I: <0.03 ng/mL (ref <0.031)

## 2014-08-24 LAB — HEPATIC FUNCTION PANEL
ALT: 14 U/L (ref 0–53)
AST: 18 U/L (ref 0–37)
Albumin: 3.3 g/dL — ABNORMAL LOW (ref 3.5–5.2)
Alkaline Phosphatase: 62 U/L (ref 39–117)
BILIRUBIN INDIRECT: 0.8 mg/dL (ref 0.3–0.9)
BILIRUBIN TOTAL: 0.9 mg/dL (ref 0.3–1.2)
Bilirubin, Direct: 0.1 mg/dL (ref 0.0–0.3)
Total Protein: 6.3 g/dL (ref 6.0–8.3)

## 2014-08-24 LAB — GLUCOSE, CAPILLARY
GLUCOSE-CAPILLARY: 186 mg/dL — AB (ref 70–99)
GLUCOSE-CAPILLARY: 195 mg/dL — AB (ref 70–99)
GLUCOSE-CAPILLARY: 173 mg/dL — AB (ref 70–99)
Glucose-Capillary: 183 mg/dL — ABNORMAL HIGH (ref 70–99)
Glucose-Capillary: 240 mg/dL — ABNORMAL HIGH (ref 70–99)

## 2014-08-24 LAB — CK: CK TOTAL: 391 U/L — AB (ref 7–232)

## 2014-08-24 LAB — HEMOGLOBIN A1C
Hgb A1c MFr Bld: 10.5 % — ABNORMAL HIGH (ref <5.7)
MEAN PLASMA GLUCOSE: 255 mg/dL — AB (ref <117)

## 2014-08-24 LAB — MRSA PCR SCREENING: MRSA by PCR: NEGATIVE

## 2014-08-24 LAB — LACTIC ACID, PLASMA: Lactic Acid, Venous: 1.5 mmol/L (ref 0.5–2.2)

## 2014-08-24 LAB — PHOSPHORUS: Phosphorus: 4 mg/dL (ref 2.3–4.6)

## 2014-08-24 LAB — PROTIME-INR
INR: 1.45 (ref 0.00–1.49)
Prothrombin Time: 17.8 seconds — ABNORMAL HIGH (ref 11.6–15.2)

## 2014-08-24 LAB — MAGNESIUM: MAGNESIUM: 1.7 mg/dL (ref 1.5–2.5)

## 2014-08-24 LAB — SODIUM, URINE, RANDOM: Sodium, Ur: 31 mEq/L

## 2014-08-24 LAB — APTT: APTT: 37 s (ref 24–37)

## 2014-08-24 MED ORDER — INSULIN GLARGINE 100 UNIT/ML ~~LOC~~ SOLN
10.0000 [IU] | Freq: Every day | SUBCUTANEOUS | Status: DC
  Administered 2014-08-24 – 2014-08-25 (×3): 10 [IU] via SUBCUTANEOUS
  Filled 2014-08-24 (×4): qty 0.1

## 2014-08-24 MED ORDER — HYDROMORPHONE HCL 1 MG/ML IJ SOLN
1.0000 mg | INTRAMUSCULAR | Status: DC | PRN
  Administered 2014-08-24: 1 mg via INTRAVENOUS
  Filled 2014-08-24: qty 1

## 2014-08-24 MED ORDER — FAMOTIDINE IN NACL 20-0.9 MG/50ML-% IV SOLN
20.0000 mg | Freq: Every day | INTRAVENOUS | Status: DC
  Administered 2014-08-24 – 2014-08-25 (×2): 20 mg via INTRAVENOUS
  Filled 2014-08-24 (×3): qty 50

## 2014-08-24 MED ORDER — ENOXAPARIN SODIUM 30 MG/0.3ML ~~LOC~~ SOLN
30.0000 mg | Freq: Two times a day (BID) | SUBCUTANEOUS | Status: DC
  Administered 2014-08-24 – 2014-08-26 (×4): 30 mg via SUBCUTANEOUS
  Filled 2014-08-24 (×5): qty 0.3

## 2014-08-24 MED ORDER — ENOXAPARIN SODIUM 30 MG/0.3ML ~~LOC~~ SOLN
30.0000 mg | SUBCUTANEOUS | Status: DC
  Administered 2014-08-24: 30 mg via SUBCUTANEOUS
  Filled 2014-08-24: qty 0.3

## 2014-08-24 MED ORDER — IOHEXOL 300 MG/ML  SOLN
50.0000 mL | Freq: Once | INTRAMUSCULAR | Status: AC | PRN
  Administered 2014-08-24: 50 mL via ORAL

## 2014-08-24 MED ORDER — METOCLOPRAMIDE HCL 5 MG/ML IJ SOLN
5.0000 mg | Freq: Three times a day (TID) | INTRAMUSCULAR | Status: DC
  Administered 2014-08-24 – 2014-08-26 (×6): 5 mg via INTRAVENOUS
  Filled 2014-08-24 (×2): qty 1
  Filled 2014-08-24 (×5): qty 2
  Filled 2014-08-24: qty 1
  Filled 2014-08-24: qty 2

## 2014-08-24 MED ORDER — INSULIN ASPART 100 UNIT/ML ~~LOC~~ SOLN
0.0000 [IU] | SUBCUTANEOUS | Status: DC
  Administered 2014-08-24: 1 [IU] via SUBCUTANEOUS
  Administered 2014-08-24 (×4): 2 [IU] via SUBCUTANEOUS
  Administered 2014-08-25: 1 [IU] via SUBCUTANEOUS
  Administered 2014-08-25: 2 [IU] via SUBCUTANEOUS
  Administered 2014-08-25: 1 [IU] via SUBCUTANEOUS
  Administered 2014-08-25: 2 [IU] via SUBCUTANEOUS
  Administered 2014-08-25 – 2014-08-26 (×3): 1 [IU] via SUBCUTANEOUS
  Administered 2014-08-26: 3 [IU] via SUBCUTANEOUS
  Administered 2014-08-26: 1 [IU] via SUBCUTANEOUS

## 2014-08-24 NOTE — Progress Notes (Signed)
ANTICOAGULATION CONSULT NOTE - Follow-up  Pharmacy Consult for Lovenox Indication: DVT Prophylaxis  No Known Allergies  Patient Measurements: Height: 5\' 9"  (175.3 cm) Weight: 257 lb 11.5 oz (116.9 kg) IBW/kg (Calculated) : 70.7   Vital Signs: Temp: 98.3 F (36.8 C) (01/17 0800) Temp Source: Oral (01/17 0800) BP: 139/61 mmHg (01/17 1200) Pulse Rate: 83 (01/17 1200)  Labs:  Recent Labs  08/23/14 0456 08/23/14 2138 08/23/14 2301 08/24/14 0420 08/24/14 1118  HGB 13.9  --   --  12.2*  --   HCT 40.8  --   --  35.9*  --   PLT 203  --   --  166  --   APTT  --   --   --  37  --   LABPROT  --   --   --  17.8*  --   INR  --   --   --  1.45  --   CREATININE 1.60* 3.33*  --  2.12* 1.26  CKTOTAL  --   --  391*  --   --   TROPONINI  --   --  <0.03 <0.03 <0.03    Estimated Creatinine Clearance: 71.8 mL/min (by C-G formula based on Cr of 1.26).   Medical History: Past Medical History  Diagnosis Date  . Hypertension   . High cholesterol   . Type II diabetes mellitus   . Arthritis     Medications:  Scheduled:  . enoxaparin (LOVENOX) injection  30 mg Subcutaneous Q24H  . famotidine (PEPCID) IV  20 mg Intravenous Daily  . insulin aspart  0-9 Units Subcutaneous 6 times per day  . insulin glargine  10 Units Subcutaneous QHS  . metoCLOPramide (REGLAN) injection  5 mg Intravenous 3 times per day   Infusions:  . sodium chloride 75 mL/hr at 08/24/14 0600    Assessment: 69 yo M s/p L TKA 08/22/14, was placed on Xarelto for DVT prophylaxis but subsequently developed acute renal failure and ileus.  Orders received to change from Xarelto to Lovenox for DVT prophylaxis with pharmacy dosing assistance.    Goal of Therapy:  Appropriate Lovenox dosage for DVT prophylaxis following TKA  Today, 08/24/14: POD#2 D#1 Lovenox 30 mg SQ q24h (dosage was adjusted for CrCl < 30 mL/min) Hgb slightly low postoperatively.  No overt bleeding reported Pltc WNL Azotemia now resolved, estimated  CrCl well above 30 mL/min    Plan:  1. Increase Lovenox to 30mg  SQ q12h 2. Follow renal function, H/H, pltc, clinical course. 3. Once GI function returns, if renal function remains adequate should be able to switch Lovenox back to prophylactic-dose Xarelto if OK with ortho.  Elie Goodyandy Alessander Sikorski, PharmD, BCPS Pager: [phone] 08/24/2014  1:09 PM

## 2014-08-24 NOTE — Progress Notes (Signed)
Physical Therapy Treatment Patient Details Name: Christopher Rasmussen MRN: [id number] DOB: 1946/06/25 Today's Date: 08/24/2014    History of Present Illness L TKR    PT Comments    Pt able to incr gait distance today  Follow Up Recommendations  Home health PT;Supervision for mobility/OOB     Equipment Recommendations  Rolling walker with 5" wheels    Recommendations for Other Services       Precautions / Restrictions Precautions Precautions: Knee;Fall Required Braces or Orthoses: Knee Immobilizer - Left Knee Immobilizer - Left: Discontinue once straight leg raise with < 10 degree lag Restrictions LLE Weight Bearing: Weight bearing as tolerated Other Position/Activity Restrictions: WBAT    Mobility  Bed Mobility Overal bed mobility: +2 for physical assistance;Needs Assistance Bed Mobility: Supine to Sit     Supine to sit: Mod assist;+2 for physical assistance;+2 for safety/equipment     General bed mobility comments: cues for sequence and use of R LE to self assist  Transfers Overall transfer level: Needs assistance Equipment used: Rolling walker (2 wheeled) Transfers: Sit to/from Stand Sit to Stand: Mod assist;+2 physical assistance;+2 safety/equipment         General transfer comment: cues for LE management and use of UEs to self assist  Ambulation/Gait Ambulation/Gait assistance: +2 safety/equipment;Min assist;Mod assist Ambulation Distance (Feet): 18 Feet Assistive device: Rolling walker (2 wheeled) Gait Pattern/deviations: Step-to pattern;Trunk flexed;Decreased step length - left;Decreased step length - right Gait velocity: decr   General Gait Details: Step-by-step cues for sequence, posture and position from RW.     Stairs            Wheelchair Mobility    Modified Rankin (Stroke Patients Only)       Balance                                    Cognition Arousal/Alertness: Awake/alert Behavior During Therapy: WFL for tasks  assessed/performed Overall Cognitive Status: Within Functional Limits for tasks assessed                      Exercises Total Joint Exercises Ankle Circles/Pumps: AROM;Both;10 reps;Supine    General Comments        Pertinent Vitals/Pain Pain Assessment: 0-10 Pain Score: 4  Pain Location: L knee Pain Descriptors / Indicators: Constant Pain Intervention(s): Limited activity within patient's tolerance;Monitored during session;Premedicated before session    Home Living                      Prior Function            PT Goals (current goals can now be found in the care plan section) Acute Rehab PT Goals Patient Stated Goal: Resume previous lifestyle with decreased pain PT Goal Formulation: With patient Time For Goal Achievement: 08/30/14 Potential to Achieve Goals: Good Progress towards PT goals: Progressing toward goals    Frequency  7X/week    PT Plan Current plan remains appropriate    Co-evaluation             End of Session Equipment Utilized During Treatment: Gait belt;Left knee immobilizer Activity Tolerance: Patient limited by fatigue Patient left: in chair;with call bell/phone within reach;with family/visitor present     Time: 1240-1314 PT Time Calculation (min) (ACUTE ONLY): 34 min  Charges:  $Gait Training: 8-22 mins $Therapeutic Activity: 8-22 mins  G CodesDrucilla Chalet 08/24/2014, 2:04 PM

## 2014-08-24 NOTE — Progress Notes (Signed)
   Subjective:  Transferred to hospitalist service and to ICU o/n for acute on chronic renal failure, ileus, low UOP  Objective:   VITALS:   Filed Vitals:   08/23/14 2050 08/24/14 0151 08/24/14 0326 08/24/14 0400  BP: 107/56 130/65  113/67  Pulse: 81 95    Temp: 98.4 F (36.9 C) 98.5 F (36.9 C)    TempSrc: Oral Oral    Resp: 18 13 10    Height:  5\' 9"  (1.753 m)    Weight:  116.9 kg (257 lb 11.5 oz)    SpO2: 99% 96% 94%     Incision c/d/i   Lab Results  Component Value Date   WBC 10.4 08/24/2014   HGB 12.2* 08/24/2014   HCT 35.9* 08/24/2014   MCV 85.3 08/24/2014   PLT 166 08/24/2014     Assessment/Plan:  2 Days Post-Op   - NG tube placed - patient feels better - Cr improving - UOP improved - appreciate hospitalist and nephrology for assistance - dressing changed - will follow  Cheral AlmasXu, Naiping Michael 08/24/2014, 6:55 AM [phone]

## 2014-08-24 NOTE — Progress Notes (Signed)
   08/24/14 1600  PT Visit Information  Last PT Received On 08/24/14  Assistance Needed +2  History of Present Illness L TKR  PT Time Calculation  PT Start Time (ACUTE ONLY) 1610  PT Stop Time (ACUTE ONLY) 1641  PT Time Calculation (min) (ACUTE ONLY) 31 min  Subjective Data  Subjective I am better  Patient Stated Goal Resume previous lifestyle with decreased pain  Precautions  Precautions Knee;Fall  Required Braces or Orthoses Knee Immobilizer - Left  Knee Immobilizer - Left Discontinue once straight leg raise with < 10 degree lag  Restrictions  LLE Weight Bearing WBAT  Pain Assessment  Pain Assessment 0-10  Pain Score 5  Pain Location L knee  Pain Descriptors / Indicators Sore  Pain Intervention(s) Limited activity within patient's tolerance;Monitored during session;Ice applied;Repositioned  Cognition  Arousal/Alertness Awake/alert  Behavior During Therapy WFL for tasks assessed/performed  Overall Cognitive Status Within Functional Limits for tasks assessed  Bed Mobility  Overal bed mobility +2 for physical assistance  Bed Mobility Sit to Supine  Sit to supine +2 for physical assistance;Mod assist  General bed mobility comments cues for sequence and use of Ues to self assist  Transfers  Overall transfer level Needs assistance  Equipment used Rolling walker (2 wheeled)  Transfers Sit to/from Stand;Stand Pivot Transfers  Sit to Stand Mod assist;+2 physical assistance;+2 safety/equipment  Stand pivot transfers +2 physical assistance;+2 safety/equipment;Mod assist  General transfer comment cues for LE management and use of UEs to self assist; pt requires incr time and step by step cues for sequence  Ambulation/Gait  Gait velocity 6 pivotal and lateral steps bed to chair  Total Joint Exercises  Ankle Circles/Pumps AROM;Both;10 reps;Supine  Quad Sets AROM;Both;10 reps;Supine  Heel Slides AAROM;Left;10 reps;Supine  Straight Leg Raises AAROM;Left;10 reps;Supine  PT - End of  Session  Equipment Utilized During Treatment Gait belt;Left knee immobilizer  Activity Tolerance Patient limited by fatigue;Patient tolerated treatment well  Patient left in bed;with call bell/phone within reach;with nursing/sitter in room;with family/visitor present  Nurse Communication Mobility status  PT - Assessment/Plan  PT Plan Current plan remains appropriate  PT Frequency (ACUTE ONLY) 7X/week  Follow Up Recommendations Home health PT;Supervision for mobility/OOB  PT equipment Rolling walker with 5" wheels  PT Goal Progression  Progress towards PT goals Progressing toward goals  Acute Rehab PT Goals  PT Goal Formulation With patient  Time For Goal Achievement 08/30/14  Potential to Achieve Goals Good  PT General Charges  $$ ACUTE PT VISIT 1 Procedure  PT Treatments  $Therapeutic Exercise 8-22 mins  $Therapeutic Activity 8-22 mins

## 2014-08-24 NOTE — Progress Notes (Signed)
ANTICOAGULATION CONSULT NOTE - Initial Consult  Pharmacy Consult for Lovenox Indication: DVT Prophylaxis  No Known Allergies  Patient Measurements: Height: 5\' 9"  (175.3 cm) Weight: 257 lb 11.5 oz (116.9 kg) IBW/kg (Calculated) : 70.7   Vital Signs: Temp: 98.5 F (36.9 C) (01/17 0151) Temp Source: Oral (01/17 0151) BP: 113/67 mmHg (01/17 0400) Pulse Rate: 95 (01/17 0151)  Labs:  Recent Labs  08/23/14 0456 08/23/14 2138 08/23/14 2301 08/24/14 0420  HGB 13.9  --   --  12.2*  HCT 40.8  --   --  35.9*  PLT 203  --   --  166  APTT  --   --   --  37  LABPROT  --   --   --  17.8*  INR  --   --   --  1.45  CREATININE 1.60* 3.33*  --  2.12*  CKTOTAL  --   --  391*  --   TROPONINI  --   --  <0.03 <0.03    Estimated Creatinine Clearance: 42.7 mL/min (by C-G formula based on Cr of 2.12).   Medical History: Past Medical History  Diagnosis Date  . Hypertension   . High cholesterol   . Type II diabetes mellitus   . Arthritis     Medications:  Scheduled:  . enoxaparin (LOVENOX) injection  30 mg Subcutaneous Q24H  . insulin aspart  0-9 Units Subcutaneous 6 times per day  . insulin glargine  10 Units Subcutaneous QHS  . simvastatin  10 mg Oral QHS   Infusions:  . sodium chloride 75 mL/hr at 08/24/14 0400    Assessment: 69 yo s/o LTK 1/15 now with increased SCr=3.33, improved overnight to 2.12.  MD changing Xarelto to Lovenox SQ for acute renal failure.  Goal of Therapy:  DVT prophylaxid     Plan:   Lovenox 30mg  daily  F/u scr  Susanne GreenhouseGreen, Mats Jeanlouis R 08/24/2014,6:39 AM

## 2014-08-24 NOTE — Progress Notes (Signed)
  [facility] Progress Note   Subjective: UOP better, creatinine down 2.2 this am. Feeling better with NG tube in. UOP 75 cc/hr.   Filed Vitals:   08/23/14 2050 08/24/14 0151 08/24/14 0326 08/24/14 0400  BP: 107/56 130/65  113/67  Pulse: 81 95    Temp: 98.4 F (36.9 C) 98.5 F (36.9 C)    TempSrc: Oral Oral    Resp: 18 13 10    Height:  5\' 9"  (1.753 m)    Weight:  116.9 kg (257 lb 11.5 oz)    SpO2: 99% 96% 94%    Exam: Alert, overweight, no distress No jvd, flat neck veins Chest clear throughout RRR 2/6 SEM no RG Abd obese, less distended, NG tube in GU with foley in place 1+ bilat pretib edema, brawny skin changes of both lower legs Neuro is nf, Ox 3, no distress  UA by undersigned (1/16, 11:30 pm) > 4+ blood, neg LE/nit, 1+ protein, 2+ granular casts, 3-4+ nondysmorphic rbc's, no sig WBC's, numerous small blood clots in unspun urine   Assessment: 1. Acute renal failure - ATN due to ACE inhibitor (ramipril), IV NSAID's and probable hypovolemia at time of admission (on diuretic, some diarrhea at home). Looks to be recovering today with improved UOP and dropping creatinine. He does not have chronic renal failure - creat 10 days ago was 0.56.  Hopefully he will have a full recovery.  Told patient in the future for elective surgery he should hold his ramipril (or any ACE inhibitor or ARB) for 24 hours preop and hold diuretics (HCTZ in this case) for 48 hrs preop.   2. Hyperkalemia - better 3. S/P left TKR POD #2 4. HTN - cont to hold all BP meds, let BP run high if needed 5. DM2  Plan- cont IVF, labs in am    Vinson Moselleob Elis Rawlinson MD  pager [phone]    cell [phone]  08/24/2014, 7:06 AM     Recent Labs Lab 08/23/14 0456 08/23/14 2138 08/24/14 0420  NA 133* 135 135  K 4.5 5.4* 4.0  CL 98 103 100  CO2 27 22 23   GLUCOSE 268* 224* 204*  BUN 24* 33* 34*  CREATININE 1.60* 3.33* 2.12*  CALCIUM 8.5 8.4 8.4  PHOS  --   --  4.0    Recent Labs Lab  08/23/14 2359  AST 18  ALT 14  ALKPHOS 62  BILITOT 0.9  PROT 6.3  ALBUMIN 3.3*    Recent Labs Lab 08/23/14 0456 08/24/14 0420  WBC 11.4* 10.4  NEUTROABS  --  7.4  HGB 13.9 12.2*  HCT 40.8 35.9*  MCV 85.0 85.3  PLT 203 166   . enoxaparin (LOVENOX) injection  30 mg Subcutaneous Q24H  . insulin aspart  0-9 Units Subcutaneous 6 times per day  . insulin glargine  10 Units Subcutaneous QHS  . simvastatin  10 mg Oral QHS   . sodium chloride 75 mL/hr at 08/24/14 0600   acetaminophen **OR** acetaminophen, diphenhydrAMINE, HYDROmorphone (DILAUDID) injection, menthol-cetylpyridinium **OR** phenol, metoCLOPramide **OR** metoCLOPramide (REGLAN) injection, ondansetron **OR** ondansetron (ZOFRAN) IV, oxyCODONE

## 2014-08-24 NOTE — Progress Notes (Signed)
UOP excellent now and creat down to 1.0 tonight.  Should continue to do well.  See my recommendations for the future if patient has surgery / dehydrating illness he should hold his diuretics and ACE inhibitor until the surgery or illness has passed.  No further suggestions, will sign off. Does not need renal f/u.    Vinson Moselleob Carlota Philley MD (pgr) [phone]    [phone]) [phone] 08/24/2014, 7:04 PM

## 2014-08-24 NOTE — Progress Notes (Signed)
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 -post-operative treatment and rec's per orthopedic service -minimize narcotics as much as possible  2-acute renal failure: due to ATN most likely -continue holding nephrotoxic agents -continue IVF's another 24 hours -Cr improving -appreciated renal service rec's -no obstruction on renal US  3-diabetes: will use SSI -holding metformin while inpatient  4-HTN: holding avapro and HCTZ -BP is stable -will monitor off meds for now to guaranteed perfusion   5-post operative ileus: with stomach and colon distension seen on CT scan -continue NPO status and NGT -minimize narcotics  6-HLD: continue statins when tolerating PO's   Code Status: full Family Communication: wife at bedside Disposition Plan: transfer out of stepdown   Consultants:  Orthopedic service  Renal service   Procedures:  Se below for x-ray reports  Antibiotics:  None   HPI/Subjective: 400 cc out in NGT; no CP and no SOB. Feeling better. Afebrile. Urine output improving and patient passing gas  Objective: Filed Vitals:   08/24/14 1200  BP: 139/61  Pulse: 83  Temp:   Resp: 14    Intake/Output Summary (Last 24 hours) at 08/24/14 1329 Last data filed at 08/24/14 1130  Gross per 24 hour  Intake 4924.45 ml  Output   1540 ml  Net 3384.45 ml   Filed Weights   08/22/14 0819 08/22/14 1415 08/24/14 0151  Weight: 109.77 kg (242 lb) 109.77 kg (242 lb) 116.9 kg (257 lb 11.5 oz)    Exam:   General:  Feeling better. No fever, no CP; with increase urine output and passing gas  Cardiovascular: positive SEM, no rubs or gallops, S1 and S2  Respiratory: CTA bilaterally  Abdomen: less distended, soft, with positive BS  Musculoskeletal: left leg with decrease movement; trace edema bilaterally  Data Reviewed: Basic  Metabolic Panel:  Recent Labs Lab 08/23/14 0456 08/23/14 2138 08/24/14 0420 08/24/14 1118  NA 133* 135 135 135  K 4.5 5.4* 4.0 4.0  CL 98 103 100 102  CO2 GLUCOSE 268* 224* 204* 186*  BUN 24* 33* 34* 31*  CREATININE 1.60* 3.33* 2.12* 1.26  CALCIUM 8.5 8.4 8.4 8.5  MG  --   --  1.7  --   PHOS  --   --  4.0  --    Liver Function Tests:  Recent Labs Lab 08/23/14 2359  AST 18  ALT 14  ALKPHOS 62  BILITOT 0.9  PROT 6.3  ALBUMIN 3.3*   CBC:  Recent Labs Lab 08/23/14 0456 08/24/14 0420  WBC 11.4* 10.4  NEUTROABS  --  7.4  HGB 13.9 12.2*  HCT 40.8 35.9*  MCV 85.0 85.3  PLT 203 166   Cardiac Enzymes:  Recent Labs Lab 08/23/14 2301 08/24/14 0420 08/24/14 1118  CKTOTAL 391*  --   --   TROPONINI <0.03 <0.03 <0.03   CBG:  Recent Labs Lab 08/23/14 1728 08/23/14 2056 08/24/14 0003 08/24/14 0407 08/24/14 0803  GLUCAP 175* 198* 240* 186* 183*    Recent Results (from the past 240 hour(s))  Surgical pcr screen     Status: Abnormal   Collection Time: 08/15/14  2:36 PM  Result Value Ref Range Status   MRSA, PCR NEGATIVE NEGATIVE Final   Staphylococcus aureus POSITIVE (A) NEGATIVE Final    Comment:        The Xpert SA Assay (FDA approved for NASAL specimens  in patients over 85 years of age), is one component of a comprehensive surveillance program.  Test performance has been validated by [employer] for patients greater than or equal to 23 year old. It is not intended to diagnose infection nor to guide or monitor treatment.   MRSA PCR Screening     Status: None   Collection Time: 08/24/14  2:02 AM  Result Value Ref Range Status   MRSA by PCR NEGATIVE NEGATIVE Final    Comment:        The GeneXpert MRSA Assay (FDA approved for NASAL specimens only), is one component of a comprehensive MRSA colonization surveillance program. It is not intended to diagnose MRSA infection nor to guide or monitor treatment for MRSA infections.       Studies: Ct Abdomen Pelvis Wo Contrast  08/24/2014   CLINICAL DATA:  Distended abdomen.  Acute renal failure.  EXAM: CT ABDOMEN AND PELVIS WITHOUT CONTRAST  TECHNIQUE: Multidetector CT imaging of the abdomen and pelvis was performed following the standard protocol without IV contrast.  COMPARISON:  Radiographs 2 hr prior.  FINDINGS: The included lung bases are clear. The heart is mildly enlarged. There is no pleural effusion.  There is mild gaseous gastric distention. No gastric wall thickening. No small bowel dilatation. The colon is air-filled without colonic wall thickening or pericolonic inflammatory change. There scattered colonic diverticula in the descending colon without diverticulitis. The sigmoid colon is decompressed. No pneumatosis. No free air, free fluid, or intra-abdominal fluid collection.  There is no focal hepatic lesion. Questionable sludge versus small stones in the dependent gallbladder. No pericholecystic edema. The spleen, pancreas, and adrenal glands are normal. There is tortuosity and atherosclerosis of the splenic artery with questionable 15 mm splenic artery aneurysm.  Bilateral perinephric stranding. There is a 4.3 x 2.5 cm fluid density structure in the region of the lower left renal pelvis, may reflect a parapelvic cyst versus calyceal dilatation. There is no dilatation of the upper calyx. There are no urinary tract stones. The ureters decompressed and not well evaluated. There is no right hydronephrosis. The right ureter is decompressed.  The abdominal aorta is normal in caliber. There is no retroperitoneal adenopathy.  The appendix is normal. The bladder is decompressed by Foley catheter. Prostatic calcifications are seen. No pelvic free fluid.  Multilevel degenerative change in the spine, no acute or suspicious osseous abnormality. Benign-appearing sclerotic densities seen in the left iliac bone.  IMPRESSION: 1. There is mild gaseous distention of colon. Mild gaseous distention  of the stomach. No small bowel dilatation. Findings may reflect colonic ileus. There is diverticulosis in the sigmoid colon without diverticulitis. 2. Bilateral perinephric stranding that is nonspecific. There is a rounded fluid density structure in the left lower renal pelvis, may reflect a parapelvic cyst versus dilated lower pole calyx. There are no urinary tract calculi. 3. Dependent sludge versus small stones in the gallbladder. No signs of pericholecystic inflammation.   Electronically Signed   By: Rubye Oaks M.D.   On: 08/24/2014 01:48   Dg Chest 2 View  08/24/2014   CLINICAL DATA:  Leukocytosis.  Initial encounter.  EXAM: CHEST  2 VIEW  COMPARISON:  Chest radiograph from 08/15/2014  FINDINGS: The lungs are well-aerated. Pulmonary vascularity is at the upper limits of normal. Minimal bibasilar atelectasis is noted. There is no evidence of pleural effusion or pneumothorax.  The heart is borderline enlarged. No acute osseous abnormalities are seen. An apparent bone island is noted at the midthoracic spine.  IMPRESSION: Minimal bibasilar atelectasis; borderline cardiomegaly noted.   Electronically Signed   By: Roanna Raider M.D.   On: 08/24/2014 02:35   Dg Abd 1 View  08/23/2014   CLINICAL DATA:  Generalized abdominal distention, with bloating and constipation. Initial encounter.  EXAM: ABDOMEN - 1 VIEW  COMPARISON:  None.  FINDINGS: There is mild distention of the stomach and much of the colon with air, resolving at the mid descending colon. The small bowel is grossly unremarkable in appearance. CT of the abdomen and pelvis would be helpful for further evaluation, to exclude an underlying mass at the descending colon.  No free intra-abdominal air is identified, though evaluation for free air is limited on supine views. No acute osseous abnormalities are identified.  IMPRESSION: Mild distention of the stomach and much of the colon with air, resolving at the mid descending colon. The small bowel is  grossly unremarkable. CT of the abdomen and pelvis would be helpful for further evaluation, to exclude an underlying mass at the descending colon.  Alternatively, this could reflect focal diverticulitis at the descending colon, or could simply be transient in nature.   Electronically Signed   By: Roanna Raider M.D.   On: 08/23/2014 22:21   US Renal Port  08/24/2014   CLINICAL DATA:  Acute renal failure.  EXAM: RENAL/URINARY TRACT ULTRASOUND COMPLETE  COMPARISON:  Unenhanced CT abdomen and pelvis earlier same date.  FINDINGS: Examination was performed portably at patient's bedside. Technically difficult examination due to body habitus and abundant colonic bowel gas.  Right Kidney:  Length: Approximately 13.3 cm. No hydronephrosis. Well-preserved cortex. No shadowing calculi. Normal parenchymal echotexture. No focal parenchymal abnormality.  Left Kidney:  Length: Approximately 12.5 cm. No hydronephrosis. Well-preserved cortex. No shadowing calculi. Normal parenchymal echotexture. Approximate 3.4 x 2.0 x 2.7 cm parapelvic cyst arising from the mid and lower pole as noted on CT. No solid renal mass.  Bladder:  Appears normal for degree of bladder distention.  IMPRESSION: 1. No evidence of hydronephrosis involving either kidney. 2. Normal-appearing right kidney. 3. No significant abnormality involving the left kidney. Parapelvic cyst as noted on the earlier CT arising from the mid and lower left kidney.   Electronically Signed   By: Hulan Saas M.D.   On: 08/24/2014 13:21   Dg Knee Left Port  08/22/2014   CLINICAL DATA:  Post left total knee replacement  EXAM: PORTABLE LEFT KNEE - 1-2 VIEW  COMPARISON:  None.  FINDINGS: Two views of the left knee submitted. There is left knee prosthesis in anatomic alignment. Postsurgical changes are noted with periarticular soft tissue air.  IMPRESSION: Left knee prosthesis in anatomic alignment.   Electronically Signed   By: Natasha Mead M.D.   On: 08/22/2014 13:33   Dg Abd  Portable 1v  08/24/2014   CLINICAL DATA:  Nasogastric tube placement.  Initial encounter.  EXAM: PORTABLE ABDOMEN - 1 VIEW  COMPARISON:  Abdominal radiograph performed 08/23/2013  FINDINGS: The enteric tube is seen ending at the body of the stomach. The stomach is now mostly decompressed.  There is persistent mild distention of the visualized colon. The visualized lung bases are clear. No acute osseous abnormalities are seen.  IMPRESSION: 1. Enteric tube noted ending at the body of the stomach. The stomach is now mostly decompressed. 2. Persistent mild distention of the visualized colon.   Electronically Signed   By: Roanna Raider M.D.   On: 08/24/2014 04:28    Scheduled Meds: . enoxaparin (LOVENOX) injection  30 mg Subcutaneous Q12H  . famotidine (PEPCID) IV  20 mg Intravenous Daily  . insulin aspart  0-9 Units Subcutaneous 6 times per day  . insulin glargine  10 Units Subcutaneous QHS  . metoCLOPramide (REGLAN) injection  5 mg Intravenous 3 times per day   Continuous Infusions: . sodium chloride 75 mL/hr at 08/24/14 0600    Principal Problem:   Arthritis of right knee Active Problems:   HTN (hypertension)   Diabetes   Status post total left knee replacement   Acute renal failure   Abdominal distension   Hyperkalemia    Time spent: 30 minutes    Vassie LollMadera, Rylie Knierim  [facility] Pager [phone]. If 7PM-7AM, please contact night-coverage at [email or web address], password [email or web address] 08/24/2014, 1:29 PM  LOS: 2 days

## 2014-08-24 NOTE — Progress Notes (Signed)
Patient was receiving second bag of NS bolus at shift change due to oliguria. Noted only 10cc of urine. At approximately 2045 bolus was almost complete but urinary output remained at 10cc. Spoke with Dr Roda ShuttersXu at approximately 2052 re: urinary output and he stated he was going to contact the Hospitalist on call. Patient was seen by Dr Adela Glimpseoutova and several orders were given .Patient had KUB and 2view done. Blood was drawn and showed K+ 5.4 and glucose 224. CBG was 198 at 2056 earlier. Received order for insulin 10 units IV and D50% 12.59 g. Same were given at 2336. Repeat CBG at 0003 was 240. Patient was seen by Dr Arlean HoppingSchertz (Neprology) Urine sample was taken and sent for u/a, Cr, osmolarity, and Na. Noted black particles and sediments in foley tubing after tube was unclamped. Specimen saved for Dr Arlean HoppingSchertz to be examined. Patient later had CT of abd done. Order received for patient to be transferred to ICU stepdown. Was transferred at approximately 0145 to rm 1230 in stable condition

## 2014-08-25 ENCOUNTER — Encounter (HOSPITAL_COMMUNITY): Payer: Self-pay | Admitting: Orthopaedic Surgery

## 2014-08-25 LAB — CBC
HEMATOCRIT: 35.2 % — AB (ref 39.0–52.0)
HEMOGLOBIN: 12.7 g/dL — AB (ref 13.0–17.0)
MCH: 29.7 pg (ref 26.0–34.0)
MCHC: 36.1 g/dL — AB (ref 30.0–36.0)
MCV: 82.4 fL (ref 78.0–100.0)
PLATELETS: 155 10*3/uL (ref 150–400)
RBC: 4.27 MIL/uL (ref 4.22–5.81)
RDW: 13.5 % (ref 11.5–15.5)
WBC: 7.6 10*3/uL (ref 4.0–10.5)

## 2014-08-25 LAB — BASIC METABOLIC PANEL
ANION GAP: 5 (ref 5–15)
Anion gap: 11 (ref 5–15)
BUN: 14 mg/dL (ref 6–23)
BUN: 13 mg/dL (ref 6–23)
CHLORIDE: 102 meq/L (ref 96–112)
CO2: 26 mmol/L (ref 19–32)
CO2: 30 mmol/L (ref 19–32)
CREATININE: 0.63 mg/dL (ref 0.50–1.35)
Calcium: 9 mg/dL (ref 8.4–10.5)
Calcium: 8.8 mg/dL (ref 8.4–10.5)
Chloride: 104 mEq/L (ref 96–112)
Creatinine, Ser: 0.64 mg/dL (ref 0.50–1.35)
GFR calc non Af Amer: >90 mL/min (ref >90)
GLUCOSE: 146 mg/dL — AB (ref 70–99)
GLUCOSE: 149 mg/dL — AB (ref 70–99)
POTASSIUM: 3 mmol/L — AB (ref 3.5–5.1)
Potassium: 3.3 mmol/L — ABNORMAL LOW (ref 3.5–5.1)
Sodium: 141 mmol/L (ref 135–145)
Sodium: 137 mmol/L (ref 135–145)

## 2014-08-25 LAB — GLUCOSE, CAPILLARY
Glucose-Capillary: 136 mg/dL — ABNORMAL HIGH (ref 70–99)
Glucose-Capillary: 143 mg/dL — ABNORMAL HIGH (ref 70–99)
Glucose-Capillary: 143 mg/dL — ABNORMAL HIGH (ref 70–99)
Glucose-Capillary: 145 mg/dL — ABNORMAL HIGH (ref 70–99)
Glucose-Capillary: 147 mg/dL — ABNORMAL HIGH (ref 70–99)
Glucose-Capillary: 154 mg/dL — ABNORMAL HIGH (ref 70–99)
Glucose-Capillary: 160 mg/dL — ABNORMAL HIGH (ref 70–99)

## 2014-08-25 LAB — GLOMERULAR BASEMENT MEMBRANE ANTIBODIES

## 2014-08-25 LAB — ANA: Anti Nuclear Antibody(ANA): NEGATIVE

## 2014-08-25 LAB — MPO/PR-3 (ANCA) ANTIBODIES
Myeloperoxidase Abs: <1
Serine Protease 3: <1

## 2014-08-25 MED ORDER — POTASSIUM CHLORIDE 10 MEQ/50ML IV SOLN
10.0000 meq | INTRAVENOUS | Status: AC
  Administered 2014-08-25 – 2014-08-26 (×3): 10 meq via INTRAVENOUS
  Filled 2014-08-25 (×3): qty 50

## 2014-08-25 NOTE — Progress Notes (Signed)
Subjective: Awake and alert. States he is starting to feel better overall with flatus and watery stool.   Objective: Vital signs in last 24 hours: Temp:  [97.9 F (36.6 C)-100 F (37.8 C)] 98.9 F (37.2 C) (01/18 0706) Pulse Rate:  [83-110] 94 (01/18 0706) Resp:  [12-22] 18 (01/18 0706) BP: (139-182)/(61-86) 182/66 mmHg (01/18 0706) SpO2:  [95 %-99 %] 96 % (01/18 0706) Weight:  [113.9 kg (251 lb 1.7 oz)] 113.9 kg (251 lb 1.7 oz) (01/18 0500)  Intake/Output from previous day: 01/17 0701 - 01/18 0700 In: 1655 [I.V.:1575; NG/GT:30; IV Piggyback:50] Out: 2865 [Urine:2865] Intake/Output this shift:     Recent Labs  08/23/14 0456 08/24/14 0420 08/25/14 0427  HGB 13.9 12.2* 12.7*    Recent Labs  08/24/14 0420 08/25/14 0427  WBC 10.4 7.6  RBC 4.21* 4.27  HCT 35.9* 35.2*  PLT 166 155    Recent Labs  08/24/14 2305 08/25/14 0427  NA 136 141  K 4.0 3.3*  CL 105 104  CO2 23 26  BUN 19 14  CREATININE 0.80 0.64  GLUCOSE 161* 146*  CALCIUM 9.0 9.0    Recent Labs  08/24/14 0420  INR 1.45   Left knee: Neurovascular intact Dorsiflexion/Plantar flexion intact Incision: scant drainage Compartment soft  Assessment/Plan: Up with PT Labs much improved. Making urine  Hypokalemia will leave up to medicine for replacement appreciate medicines help with patient NG still in place will leave for medicine to remove   Rasmussen, Christopher Cerezo 08/25/2014, 7:53 AM

## 2014-08-25 NOTE — Progress Notes (Signed)
Patient ID: Christopher Rasmussen, male   DOB: 1946-03-02, 69 y.o.   MRN: [id number] His abdomen is soft.  He has had flatus and a small bowel movement and his NG tube has been clamped.  He is tolerating ice chips as well.  He is quite uncomfortable with his NG tube, so will have it d/c'd tonight instead of tomorrow.

## 2014-08-25 NOTE — Clinical Social Work Psychosocial (Signed)
Clinical Social Work Department BRIEF PSYCHOSOCIAL ASSESSMENT 08/25/2014  Patient:  Christopher Rasmussen, Christopher Rasmussen     Account Number:  [id number]     Admit date:  08/22/2014  Clinical Social Worker:  Daiva Huge  Date/Time:  08/25/2014 03:16 PM  Referred by:  RN  Date Referred:  08/25/2014 Referred for  SNF Placement   Other Referral:   Interview type:  Other - See comment Other interview type:   CSW met with patient and wife at bedside    PSYCHOSOCIAL DATA Living Status:  FAMILY Admitted from facility:   Level of care:   Primary support name:  wife Primary support relationship to patient:  FAMILY Degree of support available:   good    CURRENT CONCERNS Current Concerns  Post-Acute Placement   Other Concerns:    SOCIAL WORK ASSESSMENT / PLAN Patient is s/p left TKR with post op ileus- PT and OT are thinking he may need a short SNF stay prior to returning home with wife-  both patient and wife are in agreement with a SNF search in case this is needed- provided list and explained process, coverage, etc- wife has worked in [place] so she is familiar with SNF.   Assessment/plan status:  Other - See comment Other assessment/ plan:   Will complete FL2 and PASARR for ?SNF   Information/referral to community resources:   SNF list    PATIENT'S/FAMILY'S RESPONSE TO PLAN OF CARE: Patient and wife agree to SNF search in case he is not able to go directly home. CSW will pursue options as well as seek insurance auth and plan to f/u with them for final decision on disposition.       Eduard Clos, MSW, Brenda

## 2014-08-25 NOTE — Clinical Social Work Placement (Signed)
Clinical Social Work Department CLINICAL SOCIAL WORK PLACEMENT NOTE 08/25/2014  Patient:  Christopher Rasmussen,Christopher Rasmussen  Account Number:  [id number] Admit date:  08/22/2014  Clinical Social Worker:  Robin SearingJANET Carilyn Woolston, LCSWA  Date/time:  08/25/2014 03:23 PM  Clinical Social Work is seeking post-discharge placement for this patient at the following level of care:   SKILLED NURSING   (*CSW will update this form in Epic as items are completed)   08/25/2014  [facility]family provided with [facility]'s list of facilities offering this level of care within the geographic area requested by the patient (or if unable, by the patient's family).  08/25/2014  Patient/family informed of their freedom to choose among providers that offer the needed level of care, that participate in Medicare, Medicaid or managed care program needed by the patient, have an available bed and are willing to accept the patient.  08/25/2014  Patient/family informed of [facility]' ownership interest in [facility], as well as of the fact that they are under no obligation to receive care at this facility.  PASARR submitted to EDS on 08/25/2014 PASARR number received on 08/25/2014  FL2 transmitted to all facilities in geographic area requested by pt/family on  08/25/2014 FL2 transmitted to all facilities within larger geographic area on   Patient informed that his/her managed care company has contracts with or will negotiate with  certain facilities, including the following:     Patient/family informed of bed offers received:   Patient chooses bed at  Physician recommends and patient chooses bed at    Patient to be transferred to  on   Patient to be transferred to facility by  Patient and family notified of transfer on  Name of family member notified:    The following physician request were entered in Epic:   Additional Comments: Reece LevyJanet Yoceline Bazar, MSW, Theresia MajorsLCSWA [phone]

## 2014-08-25 NOTE — Evaluation (Signed)
Occupational Therapy Evaluation Patient Details Name: Christopher Rasmussen MRN: [id number] DOB: 07-22-1946 Today's Date: 08/25/2014    History of Present Illness L TKR   Clinical Impression   Pt is s/p TKA resulting in the deficits listed below (see OT Problem List).  Pt will benefit from skilled OT to increase their safety and independence with ADL and functional mobility for ADL to facilitate discharge to venue listed below.        Follow Up Recommendations  Home health OT;SNF (depending on progress )    Equipment Recommendations  3 in 1 bedside comode    Recommendations for Other Services       Precautions / Restrictions Precautions Precautions: Knee;Fall Required Braces or Orthoses: Knee Immobilizer - Left Knee Immobilizer - Left: Discontinue once straight leg raise with < 10 degree lag Restrictions LLE Weight Bearing: Weight bearing as tolerated      Mobility Bed Mobility Overal bed mobility: Needs Assistance Bed Mobility: Supine to Sit     Supine to sit: Mod assist;Max assist;HOB elevated     General bed mobility comments: pt sitting on toilet  Transfers Overall transfer level: Needs assistance Equipment used: Rolling walker (2 wheeled) Transfers: Sit to/from UGI CorporationStand;Stand Pivot Transfers Sit to Stand: Mod assist;+2 physical assistance;+2 safety/equipment Stand pivot transfers: Mod assist       General transfer comment: cues for LE management and use of UEs to self assist; pt requires incr time and step by step cues for sequence    Balance                                            ADL Overall ADL's : Needs assistance/impaired     Grooming: Sitting;Set up   Upper Body Bathing: Sitting;Set up   Lower Body Bathing: Sit to/from stand;Maximal assistance   Upper Body Dressing : Set up;Sitting   Lower Body Dressing: Sit to/from stand;Maximal assistance   Toilet Transfer: RW;Moderate assistance;BSC   Toileting- Clothing Manipulation  and Hygiene: Sit to/from stand;Total assistance               Vision                     Perception     Praxis      Pertinent Vitals/Pain Pain Assessment: 0-10 Pain Score: 5  Pain Location: L knee Pain Descriptors / Indicators: Sore Pain Intervention(s): Limited activity within patient's tolerance;Monitored during session     Hand Dominance Right   Extremity/Trunk Assessment Upper Extremity Assessment Upper Extremity Assessment: Overall WFL for tasks assessed           Communication Communication Communication: No difficulties   Cognition Arousal/Alertness: Awake/alert Behavior During Therapy: WFL for tasks assessed/performed Overall Cognitive Status: Within Functional Limits for tasks assessed                     General Comments       Exercises       Shoulder Instructions      Home Living Family/patient expects to be discharged to:: Private residence Living Arrangements: Spouse/significant other Available Help at Discharge: Family Type of Home: House Home Access: Stairs to enter Enter[employer] CorporationEntrance Stairs-Number of Steps: 4 Entrance Stairs-Rails: Right;Left Home Layout: One level;Able to live on main level with bedroom/bathroom     Bathroom Shower/Tub: Producer, television/film/videoWalk-in shower   Bathroom Toilet: Standard  Home Equipment: None          Prior Functioning/Environment Level of Independence: Independent             OT Diagnosis: Generalized weakness   OT Problem List: Decreased strength;Decreased activity tolerance   OT Treatment/Interventions: Self-care/ADL training;DME and/or AE instruction;Patient/family education    OT Goals(Current goals can be found in the care plan section) Acute Rehab OT Goals Patient Stated Goal: Resume previous lifestyle with decreased pain  OT Frequency: Min 2X/week   Barriers to Rasmussen/C:            Co-evaluation              End of Session    Activity Tolerance: Patient tolerated treatment  well Patient left: in chair;with call bell/phone within reach;with family/visitor present   Time: 1610-9604 OT Time Calculation (min): 20 min Charges:  OT General Charges $OT Visit: 1 Procedure OT Evaluation $Initial OT Evaluation Tier I: 1 Procedure OT Treatments $Self Care/Home Management : 8-22 mins G-Codes:    Christopher Rasmussen 2014-09-22, 2:20 PM

## 2014-08-25 NOTE — Progress Notes (Signed)
Physical Therapy Treatment Patient Details Name: Christopher Rasmussen B Keating MRN: [id number] DOB: 05-Apr-1946 Today's Date: 08/25/2014    History of Present Illness L TKR    PT Comments    Pt  Progressing slowly, may need SNF, spoke with pt and wife regarding this possibility  Follow Up Recommendations  Home health PT;Supervision for mobility/OOB;SNF (depending on progress)     Equipment Recommendations  Rolling walker with 5" wheels    Recommendations for Other Services OT consult     Precautions / Restrictions Precautions Precautions: Knee;Fall Required Braces or Orthoses: Knee Immobilizer - Left Knee Immobilizer - Left: Discontinue once straight leg raise with < 10 degree lag Restrictions LLE Weight Bearing: Weight bearing as tolerated    Mobility  Bed Mobility Overal bed mobility: Needs Assistance Bed Mobility: Supine to Sit     Supine to sit: Mod assist;Max assist;HOB elevated     General bed mobility comments: cues for sequence and use of Ues to self assist  Transfers Overall transfer level: Needs assistance Equipment used: Rolling walker (2 wheeled) Transfers: Sit to/from Stand Sit to Stand: Mod assist;+2 physical assistance;+2 safety/equipment Stand pivot transfers: Mod assist       General transfer comment: cues for LE management and use of UEs to self assist; pt requires incr time and step by step cues for sequence  Ambulation/Gait         Gait velocity: pivot only d/t pain, pt not feeling well, dizzy, BP 173/73sats 95% on RA       Stairs            Wheelchair Mobility    Modified Rankin (Stroke Patients Only)       Balance                                    Cognition Arousal/Alertness: Awake/alert Behavior During Therapy: WFL for tasks assessed/performed Overall Cognitive Status: Within Functional Limits for tasks assessed                      Exercises Total Joint Exercises Ankle Circles/Pumps: AROM;Both;10  reps;Supine Quad Sets: AROM;Both;10 reps;Supine Heel Slides: AAROM;Left;10 reps;Supine Hip ABduction/ADduction: AROM;AAROM;Left;10 reps Straight Leg Raises: AAROM;Left;10 reps;Supine    General Comments        Pertinent Vitals/Pain Pain Assessment: 0-10 Pain Score: 5  Pain Location: L knee Pain Descriptors / Indicators: Sore Pain Intervention(s): Limited activity within patient's tolerance;Monitored during session;Repositioned;Ice applied;Other (comment) (holding meds d/t ileus)    Home Living                      Prior Function            PT Goals (current goals can now be found in the care plan section) Acute Rehab PT Goals Patient Stated Goal: Resume previous lifestyle with decreased pain PT Goal Formulation: With patient Time For Goal Achievement: 08/30/14 Potential to Achieve Goals: Good Progress towards PT goals: Progressing toward goals    Frequency  7X/week    PT Plan Current plan remains appropriate;Discharge plan needs to be updated    Co-evaluation             End of Session Equipment Utilized During Treatment: Gait belt;Left knee immobilizer Activity Tolerance: Patient limited by fatigue;Patient tolerated treatment well Patient left: in chair;with call bell/phone within reach;with family/visitor present     Time: 1040-1130 PT Time Calculation (min) (ACUTE  ONLY): 50 min  Charges:  $Therapeutic Exercise: 8-22 mins $Therapeutic Activity: 23-37 mins                    G Codes:      Spenser Cong 09-10-2014, 11:48 AM

## 2014-08-25 NOTE — Progress Notes (Signed)
[facility] PROGRESS NOTE  Christopher Rasmussen ZOX:[id number] DOB: [birth year] DOA: 08/22/2014 PCP: Aida PufferLITTLE,JAMES, MD  Assessment/Plan: 1-severe Left knee OA: status post left TKA -post-operative treatment and rec's per orthopedic service -minimize narcotics as much as possible  2-acute renal failure: due to ATN most likely -continue holding nephrotoxic agents -continue IVF's until taking PO's -Cr back to normal -appreciated renal service rec's -no obstruction on renal us  3-diabetes: will use SSI -holding metformin while inpatient  4-HTN: holding avapro and HCTZ -BP is stable -will monitor off meds for now to guaranteed perfusion   5-post operative ileus: with stomach and colon distension seen on CT scan -clam NGT and low ice chip and sips  -minimize narcotics -replete electrolytes  6-HLD: continue statins when tolerating PO's   Code Status: full Family Communication: wife at bedside Disposition Plan: transfer out of stepdown   Consultants:  Orthopedic service  Renal service   Procedures:  Se below for x-ray reports  Antibiotics:  None   HPI/Subjective: No CP and no SOB. Feeling better. Afebrile. Urine output improving and patient has had 2 BM's. Minimal output from NGT  Objective: Filed Vitals:   08/25/14 1957  BP:   Pulse:   Temp:   Resp: 17    Intake/Output Summary (Last 24 hours) at 08/25/14 2021 Last data filed at 08/25/14 1500  Gross per 24 hour  Intake   1765 ml  Output   1500 ml  Net    265 ml   Filed Weights   08/22/14 1415 08/24/14 0151 08/25/14 0500  Weight: 109.77 kg (242 lb) 116.9 kg (257 lb 11.5 oz) 113.9 kg (251 lb 1.7 oz)    Exam:   General:  Feeling better. No fever, no CP; continue to have increase urine output and has had 2 BM's  Cardiovascular: positive SEM, no rubs or gallops, S1 and S2  Respiratory: CTA bilaterally  Abdomen: less distended, soft, with positive BS  Musculoskeletal: left leg with decrease  movement; trace edema bilaterally  Data Reviewed: Basic Metabolic Panel:  Recent Labs Lab 08/24/14 0420 08/24/14 1118 08/24/14 1649 08/24/14 2305 08/25/14 0427 08/25/14 1045  NA 135 135 135 136 141 137  K 4.0 4.0 3.6 4.0 3.3* 3.0*  CL 100 102 102 105 104 102  CO2 23 25 24 23 26 30   GLUCOSE 204* 186* 191* 161* 146* 149*  BUN 34* 31* 26* 19 14 13   CREATININE 2.12* 1.26 1.03 0.80 0.64 0.63  CALCIUM 8.4 8.5 8.5 9.0 9.0 8.8  MG 1.7  --   --   --   --   --   PHOS 4.0  --   --   --   --   --    Liver Function Tests:  Recent Labs Lab 08/23/14 2359  AST 18  ALT 14  ALKPHOS 62  BILITOT 0.9  PROT 6.3  ALBUMIN 3.3*   CBC:  Recent Labs Lab 08/23/14 0456 08/24/14 0420 08/25/14 0427  WBC 11.4* 10.4 7.6  NEUTROABS  --  7.4  --   HGB 13.9 12.2* 12.7*  HCT 40.8 35.9* 35.2*  MCV 85.0 85.3 82.4  PLT 203 166 155   Cardiac Enzymes:  Recent Labs Lab 08/23/14 2301 08/24/14 0420 08/24/14 1118  CKTOTAL 391*  --   --   TROPONINI <0.03 <0.03 <0.03   CBG:  Recent Labs Lab 08/25/14 0034 08/25/14 0502 08/25/14 0812 08/25/14 1253 08/25/14 1606  GLUCAP 147* 136* 145* 143* 154*    Recent Results (from the  past 240 hour(s))  MRSA PCR Screening     Status: None   Collection Time: 08/24/14  2:02 AM  Result Value Ref Range Status   MRSA by PCR NEGATIVE NEGATIVE Final    Comment:        The GeneXpert MRSA Assay (FDA approved for NASAL specimens only), is one component of a comprehensive MRSA colonization surveillance program. It is not intended to diagnose MRSA infection nor to guide or monitor treatment for MRSA infections.      Studies: Ct Abdomen Pelvis Wo Contrast  08/24/2014   CLINICAL DATA:  Distended abdomen.  Acute renal failure.  EXAM: CT ABDOMEN AND PELVIS WITHOUT CONTRAST  TECHNIQUE: Multidetector CT imaging of the abdomen and pelvis was performed following the standard protocol without IV contrast.  COMPARISON:  Radiographs 2 hr prior.  FINDINGS: The  included lung bases are clear. The heart is mildly enlarged. There is no pleural effusion.  There is mild gaseous gastric distention. No gastric wall thickening. No small bowel dilatation. The colon is air-filled without colonic wall thickening or pericolonic inflammatory change. There scattered colonic diverticula in the descending colon without diverticulitis. The sigmoid colon is decompressed. No pneumatosis. No free air, free fluid, or intra-abdominal fluid collection.  There is no focal hepatic lesion. Questionable sludge versus small stones in the dependent gallbladder. No pericholecystic edema. The spleen, pancreas, and adrenal glands are normal. There is tortuosity and atherosclerosis of the splenic artery with questionable 15 mm splenic artery aneurysm.  Bilateral perinephric stranding. There is a 4.3 x 2.5 cm fluid density structure in the region of the lower left renal pelvis, may reflect a parapelvic cyst versus calyceal dilatation. There is no dilatation of the upper calyx. There are no urinary tract stones. The ureters decompressed and not well evaluated. There is no right hydronephrosis. The right ureter is decompressed.  The abdominal aorta is normal in caliber. There is no retroperitoneal adenopathy.  The appendix is normal. The bladder is decompressed by Foley catheter. Prostatic calcifications are seen. No pelvic free fluid.  Multilevel degenerative change in the spine, no acute or suspicious osseous abnormality. Benign-appearing sclerotic densities seen in the left iliac bone.  IMPRESSION: 1. There is mild gaseous distention of colon. Mild gaseous distention of the stomach. No small bowel dilatation. Findings may reflect colonic ileus. There is diverticulosis in the sigmoid colon without diverticulitis. 2. Bilateral perinephric stranding that is nonspecific. There is a rounded fluid density structure in the left lower renal pelvis, may reflect a parapelvic cyst versus dilated lower pole calyx.  There are no urinary tract calculi. 3. Dependent sludge versus small stones in the gallbladder. No signs of pericholecystic inflammation.   Electronically Signed   By: Rubye Oaks M.D.   On: 08/24/2014 01:48   Dg Chest 2 View  08/24/2014   CLINICAL DATA:  Leukocytosis.  Initial encounter.  EXAM: CHEST  2 VIEW  COMPARISON:  Chest radiograph from 08/15/2014  FINDINGS: The lungs are well-aerated. Pulmonary vascularity is at the upper limits of normal. Minimal bibasilar atelectasis is noted. There is no evidence of pleural effusion or pneumothorax.  The heart is borderline enlarged. No acute osseous abnormalities are seen. An apparent bone island is noted at the midthoracic spine.  IMPRESSION: Minimal bibasilar atelectasis; borderline cardiomegaly noted.   Electronically Signed   By: Roanna Raider M.D.   On: 08/24/2014 02:35   Dg Abd 1 View  08/23/2014   CLINICAL DATA:  Generalized abdominal distention, with bloating and constipation. Initial  encounter.  EXAM: ABDOMEN - 1 VIEW  COMPARISON:  None.  FINDINGS: There is mild distention of the stomach and much of the colon with air, resolving at the mid descending colon. The small bowel is grossly unremarkable in appearance. CT of the abdomen and pelvis would be helpful for further evaluation, to exclude an underlying mass at the descending colon.  No free intra-abdominal air is identified, though evaluation for free air is limited on supine views. No acute osseous abnormalities are identified.  IMPRESSION: Mild distention of the stomach and much of the colon with air, resolving at the mid descending colon. The small bowel is grossly unremarkable. CT of the abdomen and pelvis would be helpful for further evaluation, to exclude an underlying mass at the descending colon.  Alternatively, this could reflect focal diverticulitis at the descending colon, or could simply be transient in nature.   Electronically Signed   By: Roanna Raider M.D.   On: 08/23/2014 22:21    US Renal Port  08/24/2014   CLINICAL DATA:  Acute renal failure.  EXAM: RENAL/URINARY TRACT ULTRASOUND COMPLETE  COMPARISON:  Unenhanced CT abdomen and pelvis earlier same date.  FINDINGS: Examination was performed portably at patient's bedside. Technically difficult examination due to body habitus and abundant colonic bowel gas.  Right Kidney:  Length: Approximately 13.3 cm. No hydronephrosis. Well-preserved cortex. No shadowing calculi. Normal parenchymal echotexture. No focal parenchymal abnormality.  Left Kidney:  Length: Approximately 12.5 cm. No hydronephrosis. Well-preserved cortex. No shadowing calculi. Normal parenchymal echotexture. Approximate 3.4 x 2.0 x 2.7 cm parapelvic cyst arising from the mid and lower pole as noted on CT. No solid renal mass.  Bladder:  Appears normal for degree of bladder distention.  IMPRESSION: 1. No evidence of hydronephrosis involving either kidney. 2. Normal-appearing right kidney. 3. No significant abnormality involving the left kidney. Parapelvic cyst as noted on the earlier CT arising from the mid and lower left kidney.   Electronically Signed   By: Hulan Saas M.D.   On: 08/24/2014 13:21   Dg Abd Portable 1v  08/24/2014   CLINICAL DATA:  Nasogastric tube placement.  Initial encounter.  EXAM: PORTABLE ABDOMEN - 1 VIEW  COMPARISON:  Abdominal radiograph performed 08/23/2013  FINDINGS: The enteric tube is seen ending at the body of the stomach. The stomach is now mostly decompressed.  There is persistent mild distention of the visualized colon. The visualized lung bases are clear. No acute osseous abnormalities are seen.  IMPRESSION: 1. Enteric tube noted ending at the body of the stomach. The stomach is now mostly decompressed. 2. Persistent mild distention of the visualized colon.   Electronically Signed   By: Roanna Raider M.D.   On: 08/24/2014 04:28    Scheduled Meds: . enoxaparin (LOVENOX) injection  30 mg Subcutaneous Q12H  . famotidine (PEPCID) IV   20 mg Intravenous Daily  . insulin aspart  0-9 Units Subcutaneous 6 times per day  . insulin glargine  10 Units Subcutaneous QHS  . metoCLOPramide (REGLAN) injection  5 mg Intravenous 3 times per day   Continuous Infusions: . sodium chloride 75 mL/hr at 08/25/14 1645    Principal Problem:   Arthritis of right knee Active Problems:   HTN (hypertension)   Diabetes   Status post total left knee replacement   Acute renal failure   Abdominal distension   Hyperkalemia   Ileus, postoperative   Acute renal failure with tubular necrosis    Time spent: 30 minutes    Gwenlyn Perking,  [facility]  [facility] Pager [phone]. If 7PM-7AM, please contact night-coverage at [email or web address], password [facility] 08/25/2014, 8:21 PM  LOS: 3 days

## 2014-08-25 NOTE — Progress Notes (Signed)
Physical Therapy Treatment Patient Details Name: Christopher Rasmussen MRN: [id number] DOB: Sep 24, 1945 Today's Date: 08/25/2014    History of Present Illness L TKR    PT Comments    Pt progressing, incr gait distance today  Follow Up Recommendations  Home health PT;Supervision for mobility/OOB;SNF     Equipment Recommendations  Rolling walker with 5" wheels    Recommendations for Other Services OT consult     Precautions / Restrictions Precautions Precautions: Knee;Fall Required Braces or Orthoses: Knee Immobilizer - Left Knee Immobilizer - Left: Discontinue once straight leg raise with < 10 degree lag Restrictions LLE Weight Bearing: Weight bearing as tolerated    Mobility  Bed Mobility Overal bed mobility: Needs Assistance Bed Mobility: Supine to Sit       Sit to supine: Min assist;Mod assist   General bed mobility comments: cues for technique  Transfers Overall transfer level: Needs assistance Equipment used: Rolling walker (2 wheeled) Transfers: Sit to/from Stand Sit to Stand: Min assist;Mod assist Stand pivot transfers: Mod assist       General transfer comment: cues for LE management and use of UEs to self assist; pt requires incr time and step by step cues for sequence, incr time  Ambulation/Gait Ambulation/Gait assistance: Min guard;Min assist Ambulation Distance (Feet): 15 Feet Assistive device: Rolling walker (2 wheeled) Gait Pattern/deviations: Step-to pattern;Antalgic     General Gait Details: cues for sequence, use of UEs/RW    Stairs            Wheelchair Mobility    Modified Rankin (Stroke Patients Only)       Balance                                    Cognition Arousal/Alertness: Awake/alert Behavior During Therapy: WFL for tasks assessed/performed Overall Cognitive Status: Within Functional Limits for tasks assessed                      Exercises Total Joint Exercises Ankle Circles/Pumps:  AROM;Both;10 reps;Supine    General Comments        Pertinent Vitals/Pain Pain Assessment: 0-10 Pain Score: 4  Pain Location: L knee Pain Descriptors / Indicators: Sore Pain Intervention(s): Limited activity within patient's tolerance;Monitored during session;Premedicated before session    Home Living Family/patient expects to be discharged to:: Private residence Living Arrangements: Spouse/significant other Available Help at Discharge: Family Type of Home: House Home Access: Stairs to enter Entrance Stairs-Rails: Right;Left Home Layout: One level;Able to live on main level with bedroom/bathroom Home Equipment: None      Prior Function Level of Independence: Independent          PT Goals (current goals can now be found in the care plan section) Acute Rehab PT Goals Patient Stated Goal: Resume previous lifestyle with decreased pain Time For Goal Achievement: 08/30/14 Potential to Achieve Goals: Good Progress towards PT goals: Progressing toward goals    Frequency  7X/week    PT Plan Current plan remains appropriate;Discharge plan needs to be updated    Co-evaluation             End of Session Equipment Utilized During Treatment: Gait belt;Left knee immobilizer Activity Tolerance: Patient tolerated treatment well Patient left: in chair;with call bell/phone within reach;with family/visitor present     Time: 5621-3086 PT Time Calculation (min) (ACUTE ONLY): 28 min  Charges:  $Gait Training: 8-22 mins $Therapeutic Activity: 8-22 mins  G CodesDrucilla Chalet:      Thao Bauza 08/25/2014, 3:37 PM

## 2014-08-26 LAB — BASIC METABOLIC PANEL
Anion gap: 11 (ref 5–15)
BUN: 11 mg/dL (ref 6–23)
CO2: 27 mmol/L (ref 19–32)
Calcium: 8.7 mg/dL (ref 8.4–10.5)
Chloride: 101 mEq/L (ref 96–112)
Creatinine, Ser: 0.67 mg/dL (ref 0.50–1.35)
GFR calc Af Amer: >90 mL/min (ref >90)
Glucose, Bld: 212 mg/dL — ABNORMAL HIGH (ref 70–99)
POTASSIUM: 2.8 mmol/L — AB (ref 3.5–5.1)
SODIUM: 139 mmol/L (ref 135–145)

## 2014-08-26 LAB — C3 COMPLEMENT: C3 COMPLEMENT: 171 mg/dL (ref 90–180)

## 2014-08-26 LAB — MAGNESIUM: MAGNESIUM: 1.5 mg/dL (ref 1.5–2.5)

## 2014-08-26 LAB — GLUCOSE, CAPILLARY
GLUCOSE-CAPILLARY: 127 mg/dL — AB (ref 70–99)
Glucose-Capillary: 142 mg/dL — ABNORMAL HIGH (ref 70–99)
Glucose-Capillary: 211 mg/dL — ABNORMAL HIGH (ref 70–99)

## 2014-08-26 LAB — C4 COMPLEMENT: Complement C4, Body Fluid: 39 mg/dL (ref 10–40)

## 2014-08-26 MED ORDER — FAMOTIDINE 20 MG PO TABS: 20.0000 mg | ORAL_TABLET | Freq: Every day | ORAL | Status: DC

## 2014-08-26 MED ORDER — METHOCARBAMOL 500 MG PO TABS: 500.0000 mg | ORAL_TABLET | Freq: Three times a day (TID) | ORAL | Status: DC | PRN

## 2014-08-26 MED ORDER — OXYCODONE HCL 5 MG PO TABS: 5.0000 mg | ORAL_TABLET | Freq: Three times a day (TID) | ORAL | Status: DC | PRN

## 2014-08-26 MED ORDER — FAMOTIDINE 20 MG PO TABS
20.0000 mg | ORAL_TABLET | Freq: Every day | ORAL | Status: DC
  Administered 2014-08-26: 20 mg via ORAL
  Filled 2014-08-26: qty 1

## 2014-08-26 MED ORDER — ASPIRIN EC 325 MG PO TBEC: 325.0000 mg | DELAYED_RELEASE_TABLET | Freq: Two times a day (BID) | ORAL | Status: DC

## 2014-08-26 MED ORDER — OXYCODONE HCL 5 MG PO TABS
5.0000 mg | ORAL_TABLET | Freq: Four times a day (QID) | ORAL | Status: DC | PRN
  Administered 2014-08-26: 5 mg via ORAL
  Filled 2014-08-26: qty 1

## 2014-08-26 MED ORDER — TRAMADOL HCL 50 MG PO TABS: 50.0000 mg | ORAL_TABLET | Freq: Four times a day (QID) | ORAL | Status: DC | PRN

## 2014-08-26 MED ORDER — POTASSIUM CHLORIDE ER 10 MEQ PO TBCR: 30.0000 meq | EXTENDED_RELEASE_TABLET | Freq: Two times a day (BID) | ORAL | Status: DC

## 2014-08-26 MED ORDER — ACETAMINOPHEN 500 MG PO TABS: 500.0000 mg | ORAL_TABLET | Freq: Four times a day (QID) | ORAL | Status: DC | PRN

## 2014-08-26 NOTE — Discharge Summary (Signed)
Physician Discharge Summary  Christopher MightyJohn B Blahnik ZOX:[id number] DOB: 1945-09-14 DOA: 08/22/2014  PCP: Aida PufferLITTLE,JAMES, MD  Admit date: 08/22/2014 Discharge date: 08/26/2014  Time spent: 30 minutes  Recommendations for Outpatient Follow-up:  Repeat BMET to follow electrolytes and renal function Reassess BP and adjust medications as needed  Discharge Diagnoses:  Status post total left knee replacement HTN (hypertension) Diabetes Abdominal distension Hyperkalemia Ileus, postoperative Acute renal failure with tubular necrosis   Discharge Condition: stable and improved. Will discharge home with [facility] services.  Diet recommendation: heart healthy and low carbohydrates diet  Filed Weights   08/22/14 1415 08/24/14 0151 08/25/14 0500  Weight: 109.77 kg (242 lb) 116.9 kg (257 lb 11.5 oz) 113.9 kg (251 lb 1.7 oz)    History of present illness:  69 y.o. male, has a history of pain and functional disability in the left knee due to arthritis and has failed non-surgical conservative treatments for greater than 12 weeks to includeNSAID's and/or analgesics, corticosteriod injections, viscosupplementation injections, flexibility and strengthening excercises, use of assistive devices, weight reduction as appropriate and activity modification. Onset of symptoms was gradual, starting 5 years ago with gradually worsening course since that time. The patient noted no past surgery on the left knee(s). Patient currently rates pain in the left knee(s) at 9 out of 10 with activity. Course complicated with post operative ileus and ARF.  Hospital Course:  1-severe Left knee OA: status post left TKA -post-operative treatment and rec's per orthopedic service and follow up in 2 weeks -minimize narcotics as much as possible -[facility]  2-acute renal failure: due to ATN most likely -resolved and back to normal at discharge -advise to maintain good hydration -no obstruction on renal us -BMET to  follow renal function and electrolytes during follow up appointment  3-diabetes:  -will resume home hypoglycemic regimen  4-HTN:  -stable and rising -will resume antihypertensive regimen -advise to follow heart healthy diet  5-post operative ileus: with stomach and colon distension seen on CT scan -required NGT placement; now removed and doing great -patient moving his bowels and toleratin diet and PO meds  -minimize narcotics -replete electrolytes  6-HLD: continue statins  -advise to follow heart healthy diet  7-hypokalemia: will discharge with instruction to take potassium by mouth X 3 doses 30meq for repletion -avapro will be resume as an outpatient and that would help with potassium stability as well -cause is use of NGT and post-ileus diarrhea   Procedures:  Left knee TKA  Renal US: no obstruction and no stones.  Consultations:  Orthopedic service  Renal servcie   Discharge Exam: Filed Vitals:   08/26/14 1159  BP:   Pulse:   Temp:   Resp: 18    General: Feeling better. No fever, no CP; no nausea or vomiting. Tolerating diet and PO meds. Still with difficult mobility and complaining of left leg pain around wound area.  Cardiovascular: positive SEM, no rubs or gallops, S1 and S2  Respiratory: CTA bilaterally  Abdomen: less distended, soft, with positive BS  Musculoskeletal: left leg with decrease movement; trace edema bilaterally; knee immobilizer in place.    Discharge Instructions   Discharge Instructions    Call MD / Call 911    Complete by:  As directed   If you experience chest pain or shortness of breath, CALL 911 and be transported to the hospital emergency room.  If you develope a fever above 101 F, pus (white drainage) or increased drainage or redness at the wound, or  calf pain, call your surgeon's office.     Constipation Prevention    Complete by:  As directed   Drink plenty of fluids.  Prune juice may be helpful.  You may use a stool  softener, such as Colace (over the counter) 100 mg twice a day.  Use MiraLax (over the counter) for constipation as needed.     Diet - low sodium heart healthy    Complete by:  As directed      Discharge instructions    Complete by:  As directed   Maintain good hydration Take medications as prescribed Please arrange follow up with PCP in 10 days Follow with orthopedic service as instructed Follow a heart healthy and low carbohydrates diet     Discharge patient    Complete by:  As directed      Discharge wound care:    Complete by:  As directed   Keep dressing clean and intact . May shower with dressing intact.     Elevate operative extremity    Complete by:  As directed   Encourage patient to wiggle toes often     Increase activity slowly as tolerated    Complete by:  As directed      Increase activity slowly    Complete by:  As directed      Weight bearing as tolerated    Complete by:  As directed   Laterality:  left  Extremity:  Lower          Current Discharge Medication List    START taking these medications   Details  acetaminophen (TYLENOL) 500 MG tablet Take 1 tablet (500 mg total) by mouth every 6 (six) hours as needed for mild pain or moderate pain.    aspirin EC 325 MG tablet Take 1 tablet (325 mg total) by mouth 2 (two) times daily after a meal. Qty: 30 tablet, Refills: 0    famotidine (PEPCID) 20 MG tablet Take 1 tablet (20 mg total) by mouth daily. Qty: 30 tablet, Refills: 1    methocarbamol (ROBAXIN) 500 MG tablet Take 1 tablet (500 mg total) by mouth every 6 (six) hours as needed for muscle spasms. Qty: 60 tablet, Refills: 0    oxyCODONE (OXY IR/ROXICODONE) 5 MG immediate release tablet Take 1 tablet (5 mg total) by mouth every 8 (eight) hours as needed for severe pain. Qty: 40 tablet, Refills: 0    potassium chloride (K-DUR) 10 MEQ tablet Take 3 tablets (30 mEq total) by mouth 2 (two) times daily. Qty: 9 tablet, Refills: 0      CONTINUE these  medications which have NOT CHANGED   Details  amLODipine (NORVASC) 10 MG tablet Take 10 mg by mouth every morning.     hydrochlorothiazide (MICROZIDE) 12.5 MG capsule Take 12.5 mg by mouth daily.    insulin aspart (NOVOLOG) 100 UNIT/ML injection Inject 10 Units into the skin daily with lunch.    insulin aspart protamine- aspart (NOVOLOG MIX 70/30) (70-30) 100 UNIT/ML injection Inject 30 Units into the skin every morning.    metFORMIN (GLUCOPHAGE) 1000 MG tablet Take 1,000 mg by mouth 2 (two) times daily with a meal.    metoprolol succinate (TOPROL-XL) 50 MG 24 hr tablet Take 50 mg by mouth at bedtime. Take with or immediately following a meal.    ramipril (ALTACE) 10 MG tablet Take 1 tablet (10 mg total) by mouth 2 (two) times daily. Qty: 90 tablet, Refills: 0    simvastatin (ZOCOR) 10 MG  tablet Take 10 mg by mouth at bedtime.      STOP taking these medications     aspirin 325 MG tablet        No Known Allergies Follow-up Information    Follow up with Kathryne Hitch, MD In 2 weeks.   Specialty:  Orthopedic Surgery   Contact information:   [address] [place] [address] [phone]       Follow up with [facility].   Why:  Home Health Physical Therapy   Contact information:   [address] [place] [address] [phone]       Follow up with [facility].   Why:  3N1 and rolling walker   Contact information:   [address] [place] [address] [phone]       Follow up with [facility].   Why:  home health physical therapy   Contact information:   [address] [place] [address] [phone]       Follow up with Aida Puffer, MD. Schedule an appointment as soon as possible for a visit in 10 days.   Specialty:  Family Medicine   Contact information:   [address] [place] [address] [phone]        The results of significant diagnostics from this hospitalization  (including imaging, microbiology, ancillary and laboratory) are listed below for reference.    Significant Diagnostic Studies: Ct Abdomen Pelvis Wo Contrast  08/24/2014   CLINICAL DATA:  Distended abdomen.  Acute renal failure.  EXAM: CT ABDOMEN AND PELVIS WITHOUT CONTRAST  TECHNIQUE: Multidetector CT imaging of the abdomen and pelvis was performed following the standard protocol without IV contrast.  COMPARISON:  Radiographs 2 hr prior.  FINDINGS: The included lung bases are clear. The heart is mildly enlarged. There is no pleural effusion.  There is mild gaseous gastric distention. No gastric wall thickening. No small bowel dilatation. The colon is air-filled without colonic wall thickening or pericolonic inflammatory change. There scattered colonic diverticula in the descending colon without diverticulitis. The sigmoid colon is decompressed. No pneumatosis. No free air, free fluid, or intra-abdominal fluid collection.  There is no focal hepatic lesion. Questionable sludge versus small stones in the dependent gallbladder. No pericholecystic edema. The spleen, pancreas, and adrenal glands are normal. There is tortuosity and atherosclerosis of the splenic artery with questionable 15 mm splenic artery aneurysm.  Bilateral perinephric stranding. There is a 4.3 x 2.5 cm fluid density structure in the region of the lower left renal pelvis, may reflect a parapelvic cyst versus calyceal dilatation. There is no dilatation of the upper calyx. There are no urinary tract stones. The ureters decompressed and not well evaluated. There is no right hydronephrosis. The right ureter is decompressed.  The abdominal aorta is normal in caliber. There is no retroperitoneal adenopathy.  The appendix is normal. The bladder is decompressed by Foley catheter. Prostatic calcifications are seen. No pelvic free fluid.  Multilevel degenerative change in the spine, no acute or suspicious osseous abnormality. Benign-appearing sclerotic  densities seen in the left iliac bone.  IMPRESSION: 1. There is mild gaseous distention of colon. Mild gaseous distention of the stomach. No small bowel dilatation. Findings may reflect colonic ileus. There is diverticulosis in the sigmoid colon without diverticulitis. 2. Bilateral perinephric stranding that is nonspecific. There is a rounded fluid density structure in the left lower renal pelvis, may reflect a parapelvic cyst versus dilated lower pole calyx. There are no  urinary tract calculi. 3. Dependent sludge versus small stones in the gallbladder. No signs of pericholecystic inflammation.   Electronically Signed   By: Rubye Oaks M.D.   On: 08/24/2014 01:48   Dg Chest 2 View  08/24/2014   CLINICAL DATA:  Leukocytosis.  Initial encounter.  EXAM: CHEST  2 VIEW  COMPARISON:  Chest radiograph from 08/15/2014  FINDINGS: The lungs are well-aerated. Pulmonary vascularity is at the upper limits of normal. Minimal bibasilar atelectasis is noted. There is no evidence of pleural effusion or pneumothorax.  The heart is borderline enlarged. No acute osseous abnormalities are seen. An apparent bone island is noted at the midthoracic spine.  IMPRESSION: Minimal bibasilar atelectasis; borderline cardiomegaly noted.   Electronically Signed   By: Roanna Raider M.D.   On: 08/24/2014 02:35   Dg Chest 2 View  08/15/2014   CLINICAL DATA:  Preoperative chest x-ray for left total knee arthroplasty.  EXAM: CHEST  2 VIEW  COMPARISON:  Two-view chest x-ray 02/22/2013  FINDINGS: The heart is enlarged. Lung volumes remain low. No focal airspace disease is evident. The visualized soft tissues and bony thorax are unremarkable.  IMPRESSION: Stable borderline cardiomegaly without failure.  Low lung volumes.   Electronically Signed   By: Gennette Pac M.D.   On: 08/15/2014 16:54   Dg Abd 1 View  08/23/2014   CLINICAL DATA:  Generalized abdominal distention, with bloating and constipation. Initial encounter.  EXAM: ABDOMEN - 1  VIEW  COMPARISON:  None.  FINDINGS: There is mild distention of the stomach and much of the colon with air, resolving at the mid descending colon. The small bowel is grossly unremarkable in appearance. CT of the abdomen and pelvis would be helpful for further evaluation, to exclude an underlying mass at the descending colon.  No free intra-abdominal air is identified, though evaluation for free air is limited on supine views. No acute osseous abnormalities are identified.  IMPRESSION: Mild distention of the stomach and much of the colon with air, resolving at the mid descending colon. The small bowel is grossly unremarkable. CT of the abdomen and pelvis would be helpful for further evaluation, to exclude an underlying mass at the descending colon.  Alternatively, this could reflect focal diverticulitis at the descending colon, or could simply be transient in nature.   Electronically Signed   By: Roanna Raider M.D.   On: 08/23/2014 22:21   US Renal Port  08/24/2014   CLINICAL DATA:  Acute renal failure.  EXAM: RENAL/URINARY TRACT ULTRASOUND COMPLETE  COMPARISON:  Unenhanced CT abdomen and pelvis earlier same date.  FINDINGS: Examination was performed portably at patient's bedside. Technically difficult examination due to body habitus and abundant colonic bowel gas.  Right Kidney:  Length: Approximately 13.3 cm. No hydronephrosis. Well-preserved cortex. No shadowing calculi. Normal parenchymal echotexture. No focal parenchymal abnormality.  Left Kidney:  Length: Approximately 12.5 cm. No hydronephrosis. Well-preserved cortex. No shadowing calculi. Normal parenchymal echotexture. Approximate 3.4 x 2.0 x 2.7 cm parapelvic cyst arising from the mid and lower pole as noted on CT. No solid renal mass.  Bladder:  Appears normal for degree of bladder distention.  IMPRESSION: 1. No evidence of hydronephrosis involving either kidney. 2. Normal-appearing right kidney. 3. No significant abnormality involving the left kidney.  Parapelvic cyst as noted on the earlier CT arising from the mid and lower left kidney.   Electronically Signed   By: Hulan Saas M.D.   On: 08/24/2014 13:21   Dg Knee Left Port  08/22/2014   CLINICAL DATA:  Post left total knee replacement  EXAM: PORTABLE LEFT KNEE - 1-2 VIEW  COMPARISON:  None.  FINDINGS: Two views of the left knee submitted. There is left knee prosthesis in anatomic alignment. Postsurgical changes are noted with periarticular soft tissue air.  IMPRESSION: Left knee prosthesis in anatomic alignment.   Electronically Signed   By: Natasha Mead M.D.   On: 08/22/2014 13:33   Dg Abd Portable 1v  08/24/2014   CLINICAL DATA:  Nasogastric tube placement.  Initial encounter.  EXAM: PORTABLE ABDOMEN - 1 VIEW  COMPARISON:  Abdominal radiograph performed 08/23/2013  FINDINGS: The enteric tube is seen ending at the body of the stomach. The stomach is now mostly decompressed.  There is persistent mild distention of the visualized colon. The visualized lung bases are clear. No acute osseous abnormalities are seen.  IMPRESSION: 1. Enteric tube noted ending at the body of the stomach. The stomach is now mostly decompressed. 2. Persistent mild distention of the visualized colon.   Electronically Signed   By: Roanna Raider M.D.   On: 08/24/2014 04:28    Microbiology: Recent Results (from the past 240 hour(s))  MRSA PCR Screening     Status: None   Collection Time: 08/24/14  2:02 AM  Result Value Ref Range Status   MRSA by PCR NEGATIVE NEGATIVE Final    Comment:        The GeneXpert MRSA Assay (FDA approved for NASAL specimens only), is one component of a comprehensive MRSA colonization surveillance program. It is not intended to diagnose MRSA infection nor to guide or monitor treatment for MRSA infections.      Labs: Basic Metabolic Panel:  Recent Labs Lab 08/24/14 0420  08/24/14 1649 08/24/14 2305 08/25/14 0427 08/25/14 1045 08/26/14 1001  NA 135  < > 135 136 141 137 139   K 4.0  < > 3.6 4.0 3.3* 3.0* 2.8*  CL 100  < > 102 105 104 102 101  CO2 23  < > GLUCOSE 204*  < > 191* 161* 146* 149* 212*  BUN 34*  < > 26* CREATININE 2.12*  < > 1.03 0.80 0.64 0.63 0.67  CALCIUM 8.4  < > 8.5 9.0 9.0 8.8 8.7  MG 1.7  --   --   --   --   --  1.5  PHOS 4.0  --   --   --   --   --   --   < > = values in this interval not displayed. Liver Function Tests:  Recent Labs Lab 08/23/14 2359  AST 18  ALT 14  ALKPHOS 62  BILITOT 0.9  PROT 6.3  ALBUMIN 3.3*   CBC:  Recent Labs Lab 08/23/14 0456 08/24/14 0420 08/25/14 0427  WBC 11.4* 10.4 7.6  NEUTROABS  --  7.4  --   HGB 13.9 12.2* 12.7*  HCT 40.8 35.9* 35.2*  MCV 85.0 85.3 82.4  PLT 203 166 155   Cardiac Enzymes:  Recent Labs Lab 08/23/14 2301 08/24/14 0420 08/24/14 1118  CKTOTAL 391*  --   --   TROPONINI <0.03 <0.03 <0.03   CBG:  Recent Labs Lab 08/25/14 1606 08/25/14 2035 08/26/14 0010 08/26/14 0322 08/26/14 1115  GLUCAP 154* 160* 127* 142* 211*    Signed:  Vassie Loll  [facility] 08/26/2014, 4:03 PM

## 2014-08-26 NOTE — Progress Notes (Signed)
CSW spoke with MD this am. He reported that pt is progressing. Pt will d/c home today. PN reviewed. Pt declines SNF. RNCM aware and is assisting with d/c planning. CSW signing off.  Cori RazorJamie Mena Simonis LCSW [phone]

## 2014-08-26 NOTE — Discharge Summary (Signed)
Patient ID: Christopher Rasmussen MRN: [id number] DOB/AGE: 68/30/1947 68 y.o.  Admit date: 08/22/2014 Discharge date: 08/26/2014  Admission Diagnoses:  Principal Problem:   Arthritis of right knee Active Problems:   HTN (hypertension)   Diabetes   Status post total left knee replacement   Acute renal failure   Abdominal distension   Hyperkalemia   Ileus, postoperative   Acute renal failure with tubular necrosis   Discharge Diagnoses:  Same  Past Medical History  Diagnosis Date  . Hypertension   . High cholesterol   . Type II diabetes mellitus   . Arthritis     Surgeries: Procedure(s): LEFT TOTAL KNEE ARTHROPLASTY on 08/22/2014   Consultants: Treatment Team:  Kathryne Hitch, MD  Discharged Condition: Improved  Hospital Course: Christopher Rasmussen is an 69 y.o. male who was admitted 08/22/2014 for operative treatment ofArthritis of right knee. Patient has severe unremitting pain that affects sleep, daily activities, and work/hobbies. After pre-op clearance the patient was taken to the operating room on 08/22/2014 and underwent  Procedure(s): LEFT TOTAL KNEE ARTHROPLASTY.    Patient was given perioperative antibiotics: Anti-infectives    Start     Dose/Rate Route Frequency Ordered Stop   08/22/14 1700  ceFAZolin (ANCEF) IVPB 2 g/50 mL premix     2 g100 mL/hr over 30 Minutes Intravenous Every 6 hours 08/22/14 1430 08/22/14 2242   08/22/14 0816  ceFAZolin (ANCEF) IVPB 2 g/50 mL premix     2 g100 mL/hr over 30 Minutes Intravenous On call to O.R. 08/22/14 0816 08/22/14 1105       Patient was given sequential compression devices, early ambulation, and chemoprophylaxis to prevent DVT.  Post-operatively, he did develop an ileus as well as acute renal failure.  [facility] were consulted and he was transferred briefly to step-down.  His urine output and creatinine then improved dramatically back to normal levels and his ileus resolved prior to discharge.     Recent vital signs: Patient Vitals for the past 24 hrs:  BP Temp Temp src Pulse Resp SpO2  08/26/14 0606 (!) 159/72 mmHg 98.4 F (36.9 C) Oral 98 17 98 %  08/26/14 0338 - - - - 18 97 %  08/26/14 0000 - - - - 17 97 %  08/25/14 2103 (!) 163/70 mmHg 99.8 F (37.7 C) Oral 98 18 97 %  08/25/14 1957 - - - - 17 96 %  08/25/14 0813 (!) 162/69 mmHg 99.5 F (37.5 C) Oral (!) 104 18 97 %     Recent laboratory studies:  Recent Labs  08/24/14 0420  08/25/14 0427 08/25/14 1045  WBC 10.4  --  7.6  --   HGB 12.2*  --  12.7*  --   HCT 35.9*  --  35.2*  --   PLT 166  --  155  --   NA 135  < > 141 137  K 4.0  < > 3.3* 3.0*  CL 100  < > 104 102  CO2 23  < > 26 30  BUN 34*  < > 14 13  CREATININE 2.12*  < > 0.64 0.63  GLUCOSE 204*  < > 146* 149*  INR 1.45  --   --   --   CALCIUM 8.4  < > 9.0 8.8  < > = values in this interval not displayed.   Discharge Medications:     Medication List    STOP taking these medications        aspirin  325 MG tablet  Replaced by:  aspirin EC 325 MG tablet      TAKE these medications        amLODipine 10 MG tablet  Commonly known as:  NORVASC  Take 10 mg by mouth every morning.     aspirin EC 325 MG tablet  Take 1 tablet (325 mg total) by mouth 2 (two) times daily after a meal.     hydrochlorothiazide 12.5 MG capsule  Commonly known as:  MICROZIDE  Take 12.5 mg by mouth daily.     insulin aspart 100 UNIT/ML injection  Commonly known as:  novoLOG  Inject 10 Units into the skin daily with lunch.     insulin aspart protamine- aspart (70-30) 100 UNIT/ML injection  Commonly known as:  NOVOLOG MIX 70/30  Inject 30 Units into the skin every morning.     metFORMIN 1000 MG tablet  Commonly known as:  GLUCOPHAGE  Take 1,000 mg by mouth 2 (two) times daily with a meal.     methocarbamol 500 MG tablet  Commonly known as:  ROBAXIN  Take 1 tablet (500 mg total) by mouth every 6 (six) hours as needed for muscle spasms.     metoprolol succinate 50  MG 24 hr tablet  Commonly known as:  TOPROL-XL  Take 50 mg by mouth at bedtime. Take with or immediately following a meal.     oxyCODONE-acetaminophen 5-325 MG per tablet  Commonly known as:  ROXICET  Take 1-2 tablets by mouth every 4 (four) hours as needed.     ramipril 10 MG tablet  Commonly known as:  ALTACE  Take 1 tablet (10 mg total) by mouth 2 (two) times daily.     simvastatin 10 MG tablet  Commonly known as:  ZOCOR  Take 10 mg by mouth at bedtime.        Diagnostic Studies: Ct Abdomen Pelvis Wo Contrast  08/24/2014   CLINICAL DATA:  Distended abdomen.  Acute renal failure.  EXAM: CT ABDOMEN AND PELVIS WITHOUT CONTRAST  TECHNIQUE: Multidetector CT imaging of the abdomen and pelvis was performed following the standard protocol without IV contrast.  COMPARISON:  Radiographs 2 hr prior.  FINDINGS: The included lung bases are clear. The heart is mildly enlarged. There is no pleural effusion.  There is mild gaseous gastric distention. No gastric wall thickening. No small bowel dilatation. The colon is air-filled without colonic wall thickening or pericolonic inflammatory change. There scattered colonic diverticula in the descending colon without diverticulitis. The sigmoid colon is decompressed. No pneumatosis. No free air, free fluid, or intra-abdominal fluid collection.  There is no focal hepatic lesion. Questionable sludge versus small stones in the dependent gallbladder. No pericholecystic edema. The spleen, pancreas, and adrenal glands are normal. There is tortuosity and atherosclerosis of the splenic artery with questionable 15 mm splenic artery aneurysm.  Bilateral perinephric stranding. There is a 4.3 x 2.5 cm fluid density structure in the region of the lower left renal pelvis, may reflect a parapelvic cyst versus calyceal dilatation. There is no dilatation of the upper calyx. There are no urinary tract stones. The ureters decompressed and not well evaluated. There is no right  hydronephrosis. The right ureter is decompressed.  The abdominal aorta is normal in caliber. There is no retroperitoneal adenopathy.  The appendix is normal. The bladder is decompressed by Foley catheter. Prostatic calcifications are seen. No pelvic free fluid.  Multilevel degenerative change in the spine, no acute or suspicious osseous abnormality. Benign-appearing sclerotic densities  seen in the left iliac bone.  IMPRESSION: 1. There is mild gaseous distention of colon. Mild gaseous distention of the stomach. No small bowel dilatation. Findings may reflect colonic ileus. There is diverticulosis in the sigmoid colon without diverticulitis. 2. Bilateral perinephric stranding that is nonspecific. There is a rounded fluid density structure in the left lower renal pelvis, may reflect a parapelvic cyst versus dilated lower pole calyx. There are no urinary tract calculi. 3. Dependent sludge versus small stones in the gallbladder. No signs of pericholecystic inflammation.   Electronically Signed   By: Rubye Oaks M.D.   On: 08/24/2014 01:48   Dg Chest 2 View  08/24/2014   CLINICAL DATA:  Leukocytosis.  Initial encounter.  EXAM: CHEST  2 VIEW  COMPARISON:  Chest radiograph from 08/15/2014  FINDINGS: The lungs are well-aerated. Pulmonary vascularity is at the upper limits of normal. Minimal bibasilar atelectasis is noted. There is no evidence of pleural effusion or pneumothorax.  The heart is borderline enlarged. No acute osseous abnormalities are seen. An apparent bone island is noted at the midthoracic spine.  IMPRESSION: Minimal bibasilar atelectasis; borderline cardiomegaly noted.   Electronically Signed   By: Roanna Raider M.D.   On: 08/24/2014 02:35   Dg Chest 2 View  08/15/2014   CLINICAL DATA:  Preoperative chest x-ray for left total knee arthroplasty.  EXAM: CHEST  2 VIEW  COMPARISON:  Two-view chest x-ray 02/22/2013  FINDINGS: The heart is enlarged. Lung volumes remain low. No focal airspace disease is  evident. The visualized soft tissues and bony thorax are unremarkable.  IMPRESSION: Stable borderline cardiomegaly without failure.  Low lung volumes.   Electronically Signed   By: Gennette Pac M.D.   On: 08/15/2014 16:54   Dg Abd 1 View  08/23/2014   CLINICAL DATA:  Generalized abdominal distention, with bloating and constipation. Initial encounter.  EXAM: ABDOMEN - 1 VIEW  COMPARISON:  None.  FINDINGS: There is mild distention of the stomach and much of the colon with air, resolving at the mid descending colon. The small bowel is grossly unremarkable in appearance. CT of the abdomen and pelvis would be helpful for further evaluation, to exclude an underlying mass at the descending colon.  No free intra-abdominal air is identified, though evaluation for free air is limited on supine views. No acute osseous abnormalities are identified.  IMPRESSION: Mild distention of the stomach and much of the colon with air, resolving at the mid descending colon. The small bowel is grossly unremarkable. CT of the abdomen and pelvis would be helpful for further evaluation, to exclude an underlying mass at the descending colon.  Alternatively, this could reflect focal diverticulitis at the descending colon, or could simply be transient in nature.   Electronically Signed   By: Roanna Raider M.D.   On: 08/23/2014 22:21   US Renal Port  08/24/2014   CLINICAL DATA:  Acute renal failure.  EXAM: RENAL/URINARY TRACT ULTRASOUND COMPLETE  COMPARISON:  Unenhanced CT abdomen and pelvis earlier same date.  FINDINGS: Examination was performed portably at patient's bedside. Technically difficult examination due to body habitus and abundant colonic bowel gas.  Right Kidney:  Length: Approximately 13.3 cm. No hydronephrosis. Well-preserved cortex. No shadowing calculi. Normal parenchymal echotexture. No focal parenchymal abnormality.  Left Kidney:  Length: Approximately 12.5 cm. No hydronephrosis. Well-preserved cortex. No shadowing  calculi. Normal parenchymal echotexture. Approximate 3.4 x 2.0 x 2.7 cm parapelvic cyst arising from the mid and lower pole as noted on CT. No  solid renal mass.  Bladder:  Appears normal for degree of bladder distention.  IMPRESSION: 1. No evidence of hydronephrosis involving either kidney. 2. Normal-appearing right kidney. 3. No significant abnormality involving the left kidney. Parapelvic cyst as noted on the earlier CT arising from the mid and lower left kidney.   Electronically Signed   By: Hulan Saashomas  Lawrence M.D.   On: 08/24/2014 13:21   Dg Knee Left Port  08/22/2014   CLINICAL DATA:  Post left total knee replacement  EXAM: PORTABLE LEFT KNEE - 1-2 VIEW  COMPARISON:  None.  FINDINGS: Two views of the left knee submitted. There is left knee prosthesis in anatomic alignment. Postsurgical changes are noted with periarticular soft tissue air.  IMPRESSION: Left knee prosthesis in anatomic alignment.   Electronically Signed   By: Natasha MeadLiviu  Pop M.D.   On: 08/22/2014 13:33   Dg Abd Portable 1v  08/24/2014   CLINICAL DATA:  Nasogastric tube placement.  Initial encounter.  EXAM: PORTABLE ABDOMEN - 1 VIEW  COMPARISON:  Abdominal radiograph performed 08/23/2013  FINDINGS: The enteric tube is seen ending at the body of the stomach. The stomach is now mostly decompressed.  There is persistent mild distention of the visualized colon. The visualized lung bases are clear. No acute osseous abnormalities are seen.  IMPRESSION: 1. Enteric tube noted ending at the body of the stomach. The stomach is now mostly decompressed. 2. Persistent mild distention of the visualized colon.   Electronically Signed   By: Roanna RaiderJeffery  Chang M.D.   On: 08/24/2014 04:28    Disposition: 01-Home or Self Care      Discharge Instructions    Call MD / Call 911    Complete by:  As directed   If you experience chest pain or shortness of breath, CALL 911 and be transported to the hospital emergency room.  If you develope a fever above 101 F, pus  (white drainage) or increased drainage or redness at the wound, or calf pain, call your surgeon's office.     Constipation Prevention    Complete by:  As directed   Drink plenty of fluids.  Prune juice may be helpful.  You may use a stool softener, such as Colace (over the counter) 100 mg twice a day.  Use MiraLax (over the counter) for constipation as needed.     Diet - low sodium heart healthy    Complete by:  As directed      Discharge patient    Complete by:  As directed      Discharge wound care:    Complete by:  As directed   Keep dressing clean and intact . May shower with dressing intact.     Elevate operative extremity    Complete by:  As directed   Encourage patient to wiggle toes often     Increase activity slowly as tolerated    Complete by:  As directed      Weight bearing as tolerated    Complete by:  As directed   Laterality:  left  Extremity:  Lower           Follow-up Information    Follow up with Kathryne HitchBLACKMAN,Yanique Mulvihill Y, MD In 2 weeks.   Specialty:  Orthopedic Surgery   Contact information:   [address] [place] [address] [phone]       [facility]   Why:  Home Health Physical Therapy   Contact information:   [address] [place] [address] [phone]       Follow up with [facility].   Why:  3N1 and rolling walker   Contact information:   [address] [place] [address] [phone]       Follow up with Kathryne Hitch, MD. Schedule an appointment as soon as possible for a visit in 2 weeks.   Specialty:  Orthopedic Surgery   Contact information:   [address] [place] [address] [phone]        Signed: Kathryne Hitch 08/26/2014, 7:48 AM

## 2014-08-26 NOTE — Progress Notes (Signed)
Physical Therapy Treatment Patient Details Name: Christopher Rasmussen MRN: [id number] DOB: [birth year] Today's Date: 08/26/2014    History of Present Illness L TKR; pt developed post op ileus    PT Comments    Pt with slow progress, very limited knee flexion; continue to recommend SNF but pt refuses; will need assist with all mobility at home, is currently heavy min to mod assist with transfers; will see again for stair training once wife returns  Follow Up Recommendations  Home health PT;Supervision for mobility/OOB;SNF     Equipment Recommendations       Recommendations for Other Services       Precautions / Restrictions Precautions Precautions: Knee;Fall Required Braces or Orthoses: Knee Immobilizer - Left Knee Immobilizer - Left: Discontinue once straight leg raise with < 10 degree lag Restrictions LLE Weight Bearing: Weight bearing as tolerated    Mobility  Bed Mobility Overal bed mobility: Needs Assistance Bed Mobility: Supine to Sit       Sit to supine: Min assist;Mod assist   General bed mobility comments: cues for technique  Transfers Overall transfer level: Needs assistance Equipment used: Rolling walker (2 wheeled) Transfers: Sit to/from Stand Sit to Stand: Min assist;Mod assist Stand pivot transfers: Mod assist       General transfer comment: cues for LE management and use of UEs to self assist; pt requires incr time and step by step cues for sequence, incr time  Ambulation/Gait Ambulation/Gait assistance: Min assist;Min guard Ambulation Distance (Feet): 40 Feet Assistive device: Rolling walker (2 wheeled) Gait Pattern/deviations: Step-to pattern;Trunk flexed;Antalgic Gait velocity: very slow Gait velocity interpretation: Below normal speed for age/gender General Gait Details: cues for posture, sequence, incr time to complete distance above   Stairs            Wheelchair Mobility    Modified Rankin (Stroke Patients Only)       Balance                                     Cognition Arousal/Alertness: Awake/alert Behavior During Therapy: WFL for tasks assessed/performed Overall Cognitive Status: Within Functional Limits for tasks assessed                      Exercises Total Joint Exercises Ankle Circles/Pumps: AROM;Both;10 reps;Supine Quad Sets: AROM;Both;10 reps;Supine Heel Slides: AAROM;Left;10 reps;Supine;Limitations Heel Slides Limitations: pt with incr muscle guarding, VERY limited knee flexion Hip ABduction/ADduction: AROM;AAROM;Left;10 reps Straight Leg Raises: AAROM;Left;10 reps;Supine Goniometric ROM: pt very limited--grossly -10 to 30* flexion, unable to tol further flexion    General Comments        Pertinent Vitals/Pain Pain Assessment: 0-10 Pain Score: 5  Pain Location: L KNEE Pain Descriptors / Indicators: Constant Pain Intervention(s): Limited activity within patient's tolerance;Monitored during session;Ice applied;RN gave pain meds during session    Home Living                      Prior Function            PT Goals (current goals can now be found in the care plan section) Acute Rehab PT Goals Patient Stated Goal: Resume previous lifestyle with decreased pain Time For Goal Achievement: 08/30/14 Potential to Achieve Goals: Good Progress towards PT goals: Progressing toward goals    Frequency  7X/week    PT Plan Current plan remains appropriate;Discharge plan needs to be updated  Co-evaluation             End of Session Equipment Utilized During Treatment: Gait belt;Left knee immobilizer Activity Tolerance: Patient tolerated treatment well Patient left: in chair;with call bell/phone within reach;with nursing/sitter in room     Time: 1132-1204 PT Time Calculation (min) (ACUTE ONLY): 32 min  Charges:  $Gait Training: 8-22 mins $Therapeutic Exercise: 8-22 mins                    G Codes:      Jalia Zuniga 08/26/2014, 12:10 PM

## 2014-08-26 NOTE — Progress Notes (Signed)
Occupational Therapy Treatment Patient Details Name: KRISTOPH SATTLER MRN: [id number] DOB: 1946-05-08 Today's Date: 08/26/2014    History of present illness L TKR   OT comments  Would benefit from SNF but insists on going home  Follow Up Recommendations  Supervision/Assistance - 24 hour;Home health OT;SNF    Equipment Recommendations       Recommendations for Other Services      Precautions / Restrictions Precautions Precautions: Knee;Fall Required Braces or Orthoses: Knee Immobilizer - Left Knee Immobilizer - Left: Discontinue once straight leg raise with < 10 degree lag Restrictions LLE Weight Bearing: Weight bearing as tolerated       Mobility Bed Mobility Overal bed mobility: Needs Assistance Bed Mobility: Supine to Sit       Sit to supine: Min assist;Mod assist   General bed mobility comments: cues for technique  Transfers Overall transfer level: Needs assistance Equipment used: Rolling walker (2 wheeled) Transfers: Sit to/from Stand Sit to Stand: Mod assist Stand pivot transfers: Mod assist       General transfer comment: cues for LE management and use of UEs to self assist; pt requires incr time and step by step cues for sequence, incr time    Balance                                   ADL Overall ADL's : Needs assistance/impaired     Grooming: Sitting;Set up   Upper Body Bathing: Sitting;Set up   Lower Body Bathing: Sit to/from stand;Maximal assistance   Upper Body Dressing : Set up;Sitting   Lower Body Dressing: Sit to/from stand;Maximal assistance   Toilet Transfer: RW;Moderate assistance;BSC   Toileting- Clothing Manipulation and Hygiene: Sit to/from stand;Total assistance         General ADL Comments: pt continuues to need significant A but insists on going home      Vision                     Perception     Praxis      Cognition   Behavior During Therapy: WFL for tasks assessed/performed Overall  Cognitive Status: Within Functional Limits for tasks assessed                       Extremity/Trunk Assessment               Exercises     Shoulder Instructions       General Comments      Pertinent Vitals/ Pain       Pain Score: 3  Pain Location: l knee Pain Descriptors / Indicators: Sore Pain Intervention(s): Limited activity within patient's tolerance;Monitored during session  Home Living                                          Prior Functioning/Environment              Frequency       Progress Toward Goals  OT Goals(current goals can now be found in the care plan section)  Progress towards OT goals: Progressing toward goals     Plan      Co-evaluation                 End of Session     Activity  Tolerance Patient tolerated treatment well   Patient Left in chair;with call bell/phone within reach   Nurse Communication Mobility status        Time: [id number] OT Time Calculation (min): 26 min  Charges: OT General Charges $OT Visit: 1 Procedure OT Treatments $Self Care/Home Management : 23-37 mins  Janelys Glassner, Metro KungLorraine D 08/26/2014, 9:59 AM

## 2014-08-26 NOTE — Progress Notes (Signed)
Physical Therapy Treatment Patient Details Name: Christopher Rasmussen MRN: [id number] DOB: [birth year] Today's Date: 08/26/2014    History of Present Illness L TKR; pt developed post op ileus    PT Comments    POD # 4 pm session.  Pt is D/C to home today.  Attempted stair training with spouse however pt was unable to pick R LE up enough to take a full step on first step.  Spouse stated they will have to use "Plan B". They have a family member who can pump pt up steps backward in his wheelchair (apparently they have been doing this for quite some time).  Pt/spouse given handout HEP and instructed on freq.    Follow Up Recommendations  Home health PT;Supervision for mobility/OOB;SNF     Equipment Recommendations  Rolling walker with 5" wheels;3in1 (PT)    Recommendations for Other Services       Precautions / Restrictions Precautions Precautions: Knee;Fall Precaution Comments: Instructed spouse and pt on KI use Required Braces or Orthoses: Knee Immobilizer - Left Knee Immobilizer - Left: Discontinue once straight leg raise with < 10 degree lag Restrictions Weight Bearing Restrictions: No LLE Weight Bearing: Weight bearing as tolerated    Mobility  Bed Mobility               General bed mobility comments: Pt sitting EOB on arrival  Transfers Overall transfer level: Needs assistance Equipment used: Rolling walker (2 wheeled) Transfers: Sit to/from Stand Sit to Stand: Min guard;Supervision         General transfer comment: pt still requires 50% VC's on proper tech and safet with turns. difficulty with sit to stand to sit due to large ABD area.  Ambulation/Gait Ambulation/Gait assistance: Supervision;Min guard Ambulation Distance (Feet): 45 Feet Assistive device: Rolling walker (2 wheeled) Gait Pattern/deviations: Step-to pattern;Decreased step length - right;Decreased step length - left;Decreased stance time - left Gait velocity: decreased Gait velocity interpretation:  Below normal speed for age/gender General Gait Details: 25% VC's on proper sequencing and proper walker to self distance.     Stairs            Wheelchair Mobility    Modified Rankin (Stroke Patients Only)       Balance                                    Cognition Arousal/Alertness: Awake/alert Behavior During Therapy: WFL for tasks assessed/performed Overall Cognitive Status: Within Functional Limits for tasks assessed                      Exercises    General Comments        Pertinent Vitals/Pain Pain Assessment: 0-10 Pain Score: 2  Pain Location: L knee Pain Descriptors / Indicators: Sore Pain Intervention(s): Monitored during session;Premedicated before session;Repositioned    Home Living                      Prior Function            PT Goals (current goals can now be found in the care plan section) Acute Rehab PT Goals Patient Stated Goal: Resume previous lifestyle with decreased pain Time For Goal Achievement: 08/30/14 Potential to Achieve Goals: Good Progress towards PT goals: Progressing toward goals    Frequency  7X/week    PT Plan Current plan remains appropriate;Discharge plan needs to be updated  Co-evaluation             End of Session Equipment Utilized During Treatment: Gait belt;Left knee immobilizer Activity Tolerance: Patient limited by fatigue Patient left: in chair;with call bell/phone within reach     Time: 1455-1520 PT Time Calculation (min) (ACUTE ONLY): 25 min  Charges:  $Gait Training: 8-22 mins $Therapeutic Activity: 8-22 mins                    G Codes:      Felecia ShellingLori Daniyla Pfahler  PTA [facility]  Acute  Rehab Pager      [phone]

## 2014-08-26 NOTE — Care Management Note (Signed)
    Page 1 of 2   08/26/2014     9:31:28 AM CARE MANAGEMENT NOTE 08/26/2014  Patient:  Christopher Rasmussen,Christopher Rasmussen   Account Number:  [id number]  Date Initiated:  08/23/2014  Documentation initiated by:  Antony HasteBENNETT,CRYSTAL HARRIS  Subjective/Objective Assessment:   Left total knee arthroplasty     Action/Plan:   discharge planning   Anticipated DC Date:     Anticipated DC Plan:  HOME W HOME HEALTH SERVICES      DC Planning Services  CM consult      [facility]  DURABLE MEDICAL EQUIPMENT   Choice offered to / List presented to:     DME arranged  3-N-1  [facility]      [facility]     HH arranged  HH[facility] PT      [facility]   Status of service:  Completed, signed off Medicare Important Message given?  YES (If response is "NO", the following Medicare IM given date fields will be blank) Date Medicare IM given:  08/26/2014 Medicare IM given by:  [facility]Pieper [place] Date Additional Medicare IM given:   Additional Medicare IM given by:    Discharge Disposition:  HOME W HOME HEALTH SERVICES  Per UR Regulation:    If discussed at Long Length of Stay Meetings, dates discussed:    Comments:  08/23/14 1145 - CM spoke with patient. Has selected [facility] for HHPT. Needs a rolling walker and 3N1. Would like to use [facility] for DME needs. [facility] and faxed the orders and H&P per their request. Rubie Maidrystal Bennett RN BSN CCM [phone]

## 2014-08-26 NOTE — Progress Notes (Signed)
Patient ID: Christopher MightyJohn B Rasmussen, male   DOB: 25-Feb-1946, 69 y.o.   MRN: [id number] Much improved overall.  Vitals stable. Good urine output.  Tolerating ice chips.  + Flatus and BM.  Abdomen soft.  Calf soft.  Incision clean and dry.  Can discharge to home this afternoon.

## 2014-08-29 LAB — CRYOGLOBULIN

## 2014-09-16 ENCOUNTER — Ambulatory Visit: Payer: Medicare PPO | Attending: Orthopaedic Surgery | Admitting: Physical Therapy

## 2014-09-16 DIAGNOSIS — M25662 Stiffness of left knee, not elsewhere classified: Secondary | ICD-10-CM | POA: Diagnosis not present

## 2014-09-16 DIAGNOSIS — M25562 Pain in left knee: Secondary | ICD-10-CM | POA: Insufficient documentation

## 2014-09-16 DIAGNOSIS — Z96652 Presence of left artificial knee joint: Secondary | ICD-10-CM | POA: Insufficient documentation

## 2014-09-16 NOTE — Therapy (Signed)
[facility] [address] [place], [place], [address] Phone: [phone]   Fax:  [phone]  Physical Therapy Evaluation  Patient Details  Name: Christopher MightyJohn B Mines MRN: [id number] Date of Birth: 01-Sep-1945 Referring Provider:  Kathryne HitchBlackman, Christopher Y*  Encounter Date: 09/16/2014      PT End of Session - 09/16/14 1309    Visit Number 1   Number of Visits 16   Date for PT Re-Evaluation 11/15/14   PT Start Time 1100   PT Stop Time 1138   PT Time Calculation (min) 38 min   Activity Tolerance Patient limited by fatigue   Behavior During Therapy [facility] for tasks assessed/performed      Past Medical History  Diagnosis Date  . Hypertension   . High cholesterol   . Type II diabetes mellitus   . Arthritis     Past Surgical History  Procedure Laterality Date  . Knee arthroscopy Left 1990's  . Total knee arthroplasty Left 08/22/2014    Procedure: LEFT TOTAL KNEE ARTHROPLASTY;  Surgeon: Kathryne Hitchhristopher Y Blackman, MD;  Location: [facility];  Service: Orthopedics;  Laterality: Left;    There were no vitals taken for this visit.  Visit Diagnosis:  Status post total left knee replacement - Plan: PT plan of care cert/re-cert  Knee stiffness, left - Plan: PT plan of care cert/re-cert  Left knee pain - Plan: PT plan of care cert/re-cert      Subjective Assessment - 09/16/14 1110    Symptoms Pt is a 69y/o male who presents to OPPT s/p L TKA on 08/22/14.  Pt also reports difficulty with R knee OA and feels R knee is worse than L knee.    Pertinent History R knee OA; HTN, DM   Limitations Standing;Walking   How long can you stand comfortably? 5-10 min   How long can you walk comfortably? 5-10 min   Patient Stated Goals to improve L knee function; improve mobility   Currently in Pain? Yes   Pain Score 1   "soreness"   Pain Location Knee   Pain Orientation Left   Pain Descriptors / Indicators Sore   Pain Type Surgical pain   Pain Onset 1 to 4  weeks ago   Pain Frequency Constant   Aggravating Factors  standing; walking; quick turns   Pain Relieving Factors sitting; rest; meds   Multiple Pain Sites Yes   Pain Score 8   Pain Type Chronic pain   Pain Location Knee   Pain Orientation Right   Pain Descriptors / Indicators Sharp   Pain Onset With Activity          [facility] - 09/16/14 1114    Assessment   Medical Diagnosis L TKA   Onset Date 08/22/14   Next MD Visit 10/09/14   Prior Therapy HHPT   Precautions   Precautions Knee   Restrictions   Weight Bearing Restrictions Yes   LLE Weight Bearing Weight bearing as tolerated   Balance Screen   Has the patient fallen in the past 6 months No   Has the patient had a decrease in activity level because of a fear of falling?  Yes   Is the patient reluctant to leave their home because of a fear of falling?  No   Home Environment   Living Enviornment Private residence   Living Arrangements Spouse/significant other;Children  son, son's wife and their children   Available Help at Discharge Family;Available 24 hours/day   Type of  Home House   Home Access Stairs to enter   Entrance Stairs-Number of Steps 4   Entrance Stairs-Rails Right;Left   Home Layout One level   Home Equipment Walker - 2 wheels;Bedside commode;Crutches   Prior Function   Level of Independence Independent with basic ADLs;Independent with gait;Independent with transfers   Vocation Retired   [employer] retired Runner, broadcasting/film/video   Leisure play with grandchildren; golf   Cognition   Overall Cognitive Status Within Functional Limits for tasks assessed   Observation/Other Assessments   Skin Integrity healing incision; mildly increased warmth and redness L knee; will monitor; increased LLE edema   Focus on Therapeutic Outcomes (FOTO)  pt did not complete   AROM   Right Knee Extension 12   Right Knee Flexion 68   Left Knee Extension 11   Left Knee Flexion 66   PROM   Left Knee Extension 6   Left  Knee Flexion 68   Strength   Right Hip Flexion 4/5   Right Hip ABduction 4/5   Right Hip ADduction 3+/5   Left Hip Flexion 3+/5   Left Hip ABduction 3/5   Left Hip ADduction 3/5   Right Knee Flexion 3/5   Right Knee Extension 3/5   Left Knee Flexion 4-/5   Left Knee Extension 4-/5   Special Tests    Special Tests Swelling Tests   Swelling Tests other   other   Comments Circumferential Measurements sup pole of patella: R 43 cm; L 45 cm   Ambulation/Gait   Ambulation/Gait Yes   Ambulation/Gait Assistance 5: Supervision   Ambulation Distance (Feet) 100 Feet   Assistive device Rolling walker   Gait Pattern Step-to pattern;Decreased step length - right;Decreased step length - left;Decreased hip/knee flexion - right;Decreased hip/knee flexion - left;Poor foot clearance - left;Poor foot clearance - right   Ambulation Surface Level;Indoor   Gait velocity 0.78 ft/sec                          PT Education - 09/16/14 1308    Education provided Yes   Education Details continue HEP from HHPT; aggressive home ROM   Person(s) Educated Patient   Methods Explanation   Comprehension Verbalized understanding          PT Short Term Goals - 09/16/14 1312    PT SHORT TERM GOAL #1   Title independent with HEP (10/14/14)   Time 4   Period Weeks   Status New   PT SHORT TERM GOAL #2   Title improve L knee AROM 5-75 for improved function (10/14/14)   Time 4   Period Weeks   Status New   PT SHORT TERM GOAL #3   Title improve gait velocity to > 1.0 ft/sec for improved mobility (10/14/14)   Time 4   Period Weeks   Status New   PT SHORT TERM GOAL #4   Title ambulate > 250' with LRAD modified independent for improved mobility (10/14/14)   Time 4   Period Weeks   Status New           PT Long Term Goals - 09/16/14 1313    PT LONG TERM GOAL #1   Title independent with advanced HEP (11/11/14)   Time 8   Period Weeks   Status New   PT LONG TERM GOAL #2   Title improve L  knee AROM 0-95 for improved motion and mobility (11/11/14)   Time 8   Period  Weeks   Status New   PT LONG TERM GOAL #3   Title improve gait velocity to > 1.5 ft/sec for improved mobility (11/11/14)   Time 8   Period Weeks   Status New   PT LONG TERM GOAL #4   Title demonstrate at least 4/5 L hip strength for improved gait and mobility (11/11/14)   Time 8   Period Weeks   Status New               Plan - 09/19/2014 1309    Clinical Impression Statement Pt presents to OPPT with significant functional limitations due to deficits listed above.  Will benefit from PT for aggressive ROM/strengthening to maximize function.  Pt limited by R knee OA recently exacerbated by increased use due to surgery on L knee.     Pt will benefit from skilled therapeutic intervention in order to improve on the following deficits Abnormal gait;Decreased knowledge of use of DME;Pain;Impaired flexibility;Decreased mobility;Decreased range of motion;Decreased strength;Difficulty walking;Decreased balance;Decreased activity tolerance;Decreased endurance   Rehab Potential Good   PT Frequency 3x / week   PT Duration 8 weeks   PT Treatment/Interventions ADLs/Self Care Home Management;Cryotherapy;Ultrasound;DME Instruction;Therapeutic activities;Passive range of motion;Manual techniques;Neuromuscular re-education;Functional mobility training;Moist Heat;Biofeedback;Stair training;Balance training;Scar mobilization;Patient/family education;Therapeutic exercise;Gait training;Electrical Stimulation   PT Next Visit Plan review home HEP if pt brings; aggressive ROM   Consulted and Agree with Plan of Care Patient          G-Codes - 19-Sep-2014 1316    Functional Assessment Tool Used L knee AROM 11-66   Functional Limitation Mobility: Walking and moving around   Mobility: Walking and Moving Around Current Status [id number]) At least 60 percent but less than 80 percent impaired, limited or restricted   Mobility: Walking and Moving  Around Goal Status [phone]) At least 20 percent but less than 40 percent impaired, limited or restricted       Problem List Patient Active Problem List   Diagnosis Date Noted  . Ileus, postoperative   . Acute renal failure with tubular necrosis   . Acute renal failure 08/23/2014  . Abdominal distension 08/23/2014  . Hyperkalemia 08/23/2014  . Arthritis of right knee 08/22/2014  . Status post total left knee replacement 08/22/2014  . Chest pain 02/22/2013  . HTN (hypertension) 02/22/2013  . HLD (hyperlipidemia) 02/22/2013  . Diabetes 02/22/2013    Clarita Crane, PT, DPT 2014-09-19 1:18 PM  [facility] [address] [place], [place], [address] Phone: [phone]   Fax:  [phone]

## 2014-09-25 ENCOUNTER — Ambulatory Visit: Payer: Medicare PPO

## 2014-09-25 DIAGNOSIS — M25562 Pain in left knee: Secondary | ICD-10-CM

## 2014-09-25 DIAGNOSIS — Z96652 Presence of left artificial knee joint: Secondary | ICD-10-CM | POA: Diagnosis not present

## 2014-09-25 DIAGNOSIS — M25662 Stiffness of left knee, not elsewhere classified: Secondary | ICD-10-CM

## 2014-09-25 NOTE — Patient Instructions (Signed)
Modified abduction to side lye, hold times as able to knee flexion, SAQ with decreased thickness of support

## 2014-09-25 NOTE — Therapy (Signed)
[facility] [address] [place], [place], [address] Phone: [phone]   Fax:  [phone]  Physical Therapy Treatment  Patient Details  Name: Christopher Rasmussen MRN: [id number] Date of Birth: Nov 03, 1945 Referring Provider:  Aida Puffer, MD  Encounter Date: 09/25/2014      PT End of Session - 09/25/14 1319    Visit Number 2   Number of Visits 16   Date for PT Re-Evaluation 11/15/14   PT Start Time 1232   PT Stop Time 1318   PT Time Calculation (min) 46 min   Activity Tolerance Patient tolerated treatment well   Behavior During Therapy [facility] for tasks assessed/performed      Past Medical History  Diagnosis Date  . Hypertension   . High cholesterol   . Type II diabetes mellitus   . Arthritis     Past Surgical History  Procedure Laterality Date  . Knee arthroscopy Left 1990's  . Total knee arthroplasty Left 08/22/2014    Procedure: LEFT TOTAL KNEE ARTHROPLASTY;  Surgeon: Kathryne Hitch, MD;  Location: [facility];  Service: Orthopedics;  Laterality: Left;    There were no vitals taken for this visit.  Visit Diagnosis:  Knee stiffness, left  Left knee pain      Subjective Assessment - 09/25/14 1237    Symptoms RT knee also sore from weight bearing.    Currently in Pain? Yes   Pain Score 1   RT knee 1 at rest   Pain Onset More than a month ago   Multiple Pain Sites No   Pain Score 1   Pain Type Surgical pain   Pain Location Knee   Pain Orientation Right;Left   Pain Descriptors / Indicators Dull;Sharp   Pain Frequency Intermittent   Pain Onset On-going                    [facility] Adult PT Treatment/Exercise - 09/25/14 1240    Knee/Hip Exercises: Aerobic   Stationary Bike Nustep L4 6 mn UE and LE   Knee/Hip Exercises: Seated   Long Arc Quad AROM;Left;1 set;15 reps   Other Seated Knee Exercises knee flexion x 10 and stretch x 10 5 reps   Knee/Hip Exercises: Supine   Quad Sets AROM;Left;1 set;20  reps   Short Arc [employer] AROM;Left;1 set;15 reps   Terminal Knee Extension AROM;Left;1 set;15 reps   Bridges Both;AROM;1 set;15 reps  gluteal squeezes with knee over large bolster   Straight Leg Raises AROM;Left;1 set;10 reps   Knee/Hip Exercises: Sidelying   Hip ABduction AROM;Left;1 set;10 reps   Manual Therapy   Manual Therapy Joint mobilization   Joint Mobilization patella mobs al  planes                PT Education - 09/25/14 1318    Education provided Yes   Education Details reviewed HEP   Person(s) Educated Patient   Methods Explanation;Verbal cues   Comprehension Returned demonstration;Verbalized understanding          PT Short Term Goals - 09/16/14 1312    PT SHORT TERM GOAL #1   Title independent with HEP (10/14/14)   Time 4   Period Weeks   Status New   PT SHORT TERM GOAL #2   Title improve L knee AROM 5-75 for improved function (10/14/14)   Time 4   Period Weeks   Status New   PT SHORT TERM GOAL #3   Title improve gait velocity to >  1.0 ft/sec for improved mobility (10/14/14)   Time 4   Period Weeks   Status New   PT SHORT TERM GOAL #4   Title ambulate > 250' with LRAD modified independent for improved mobility (10/14/14)   Time 4   Period Weeks   Status New           PT Long Term Goals - 09/16/14 1313    PT LONG TERM GOAL #1   Title independent with advanced HEP (11/11/14)   Time 8   Period Weeks   Status New   PT LONG TERM GOAL #2   Title improve L knee AROM 0-95 for improved motion and mobility (11/11/14)   Time 8   Period Weeks   Status New   PT LONG TERM GOAL #3   Title improve gait velocity to > 1.5 ft/sec for improved mobility (11/11/14)   Time 8   Period Weeks   Status New   PT LONG TERM GOAL #4   Title demonstrate at least 4/5 L hip strength for improved gait and mobility (11/11/14)   Time 8   Period Weeks   Status New               Plan - 09/25/14 1319    Clinical Impression Statement Did his HEP well . Need to add  weight to exercise and manual for range.    Consulted and Agree with Plan of Care Patient        Problem List Patient Active Problem List   Diagnosis Date Noted  . Ileus, postoperative   . Acute renal failure with tubular necrosis   . Acute renal failure 08/23/2014  . Abdominal distension 08/23/2014  . Hyperkalemia 08/23/2014  . Arthritis of right knee 08/22/2014  . Status post total left knee replacement 08/22/2014  . Chest pain 02/22/2013  . HTN (hypertension) 02/22/2013  . HLD (hyperlipidemia) 02/22/2013  . Diabetes 02/22/2013    Caprice Redhasse, Burton Gahan M PT 09/25/2014, 1:21 PM  [facility] [address] [place], [place], [address] Phone: [phone]   Fax:  [phone]

## 2014-09-26 ENCOUNTER — Ambulatory Visit: Payer: Medicare PPO

## 2014-09-26 DIAGNOSIS — M25562 Pain in left knee: Secondary | ICD-10-CM

## 2014-09-26 DIAGNOSIS — M25662 Stiffness of left knee, not elsewhere classified: Secondary | ICD-10-CM

## 2014-09-26 DIAGNOSIS — Z96652 Presence of left artificial knee joint: Secondary | ICD-10-CM | POA: Diagnosis not present

## 2014-09-26 DIAGNOSIS — R609 Edema, unspecified: Secondary | ICD-10-CM

## 2014-09-26 NOTE — Therapy (Signed)
[facility] [address] [place], [place], [address] Phone: [phone]   Fax:  [phone]  Physical Therapy Treatment  Patient Details  Name: Christopher Rasmussen MRN: [id number] Date of Birth: September 01, 1945 Referring Provider:  Aida Puffer, MD  Encounter Date: 09/26/2014      PT End of Session - 09/26/14 1139    Visit Number 3   Number of Visits 16   Date for PT Re-Evaluation 11/15/14   PT Start Time 1050   PT Stop Time 1150   PT Time Calculation (min) 60 min   Activity Tolerance Patient tolerated treatment well   Behavior During Therapy [facility] for tasks assessed/performed      Past Medical History  Diagnosis Date  . Hypertension   . High cholesterol   . Type II diabetes mellitus   . Arthritis     Past Surgical History  Procedure Laterality Date  . Knee arthroscopy Left 1990's  . Total knee arthroplasty Left 08/22/2014    Procedure: LEFT TOTAL KNEE ARTHROPLASTY;  Surgeon: Kathryne Hitch, MD;  Location: [facility];  Service: Orthopedics;  Laterality: Left;    There were no vitals taken for this visit.  Visit Diagnosis:  Knee stiffness, left  Left knee pain  Edema      Subjective Assessment - 09/26/14 1101    Symptoms NO CHANGES FROM YESTERDAY   Currently in Pain? Yes   Pain Score 1    Multiple Pain Sites No                    [facility] Adult PT Treatment/Exercise - 09/26/14 1102    Knee/Hip Exercises: Aerobic   Stationary Bike Nustep L4 6 mn UE and LE   Knee/Hip Exercises: Supine   Quad Sets AROM;Left;3 sets;15 reps   Terminal Knee Extension AROM;Left;1 set;15 reps   Knee/Hip Exercises: Sidelying   Hip ABduction AROM;Left;10 reps;2 sets   Knee/Hip Exercises: Prone   Straight Leg Raises AROM;2 sets;Left;10 reps   Manual Therapy   Manual Therapy Passive ROM;Manual Lymphatic Drainage (MLD)   Manual Lymphatic Drainage (MLD) Retro grade massage to Lt leg prior to game ready   Passive ROM Emphasis  on exteniosn today with fascial pulls, joint mobs and stretching  in prone and supine                PT Education - 09/25/14 1318    Education provided Yes   Education Details reviewed HEP   Person(s) Educated Patient   Methods Explanation;Verbal cues   Comprehension Returned demonstration;Verbalized understanding          PT Short Term Goals - 09/26/14 1142    PT SHORT TERM GOAL #1   Title independent with HEP (10/14/14)   Status Achieved   PT SHORT TERM GOAL #2   Title improve L knee AROM 5-75 for improved function (10/14/14)   Status On-going   PT SHORT TERM GOAL #3   Title improve gait velocity to > 1.0 ft/sec for improved mobility (10/14/14)   Status On-going   PT SHORT TERM GOAL #4   Title ambulate > 250' with LRAD modified independent for improved mobility (10/14/14)   Status On-going           PT Long Term Goals - 09/16/14 1313    PT LONG TERM GOAL #1   Title independent with advanced HEP (11/11/14)   Time 8   Period Weeks   Status New   PT LONG TERM GOAL #2  Title improve L knee AROM 0-95 for improved motion and mobility (11/11/14)   Time 8   Period Weeks   Status New   PT LONG TERM GOAL #3   Title improve gait velocity to > 1.5 ft/sec for improved mobility (11/11/14)   Time 8   Period Weeks   Status New   PT LONG TERM GOAL #4   Title demonstrate at least 4/5 L hip strength for improved gait and mobility (11/11/14)   Time 8   Period Weeks   Status New               Plan - 09/26/14 1139    Clinical Impression Statement Good tolerance for stretching but still painful, swollen. Will add weight and work on extension stretching , flexion stretch   PT Next Visit Plan Add some weight to exercise, continue range, massage and game ready if helpful   PT Home Exercise Plan Cont same HEP   Consulted and Agree with Plan of Care Patient     MEASURE RANGE AND TRY GAIT WITH SPC   Problem List Patient Active Problem List   Diagnosis Date Noted  . Ileus,  postoperative   . Acute renal failure with tubular necrosis   . Acute renal failure 08/23/2014  . Abdominal distension 08/23/2014  . Hyperkalemia 08/23/2014  . Arthritis of right knee 08/22/2014  . Status post total left knee replacement 08/22/2014  . Chest pain 02/22/2013  . HTN (hypertension) 02/22/2013  . HLD (hyperlipidemia) 02/22/2013  . Diabetes 02/22/2013    Caprice RedChasse, Jeremaih Klima M PT 09/26/2014, 11:44 AM  [facility] [address] [place], [place], [address] Phone: [phone]   Fax:  [phone]

## 2014-09-29 ENCOUNTER — Ambulatory Visit: Payer: Medicare PPO

## 2014-09-29 DIAGNOSIS — R609 Edema, unspecified: Secondary | ICD-10-CM

## 2014-09-29 DIAGNOSIS — M25662 Stiffness of left knee, not elsewhere classified: Secondary | ICD-10-CM

## 2014-09-29 DIAGNOSIS — Z96652 Presence of left artificial knee joint: Secondary | ICD-10-CM | POA: Diagnosis not present

## 2014-09-29 DIAGNOSIS — M25562 Pain in left knee: Secondary | ICD-10-CM

## 2014-09-29 NOTE — Therapy (Signed)
[facility] Outpatient Rehabilitation [facility] [address] [place], [place], [address] Phone: [phone]   Fax:  [phone]  Physical Therapy Treatment  Patient Details  Name: Christopher Rasmussen MRN: [id number] Date of Birth: 22-Jul-1946 Referring Provider:  Aida Puffer, MD  Encounter Date: 09/29/2014      PT End of Session - 09/29/14 1232    Visit Number 4   Number of Visits 16   Date for PT Re-Evaluation 11/15/14   PT Start Time 1145   PT Stop Time 1245   PT Time Calculation (min) 60 min   Activity Tolerance Patient tolerated treatment well   Behavior During Therapy [facility] for tasks assessed/performed      Past Medical History  Diagnosis Date  . Hypertension   . High cholesterol   . Type II diabetes mellitus   . Arthritis     Past Surgical History  Procedure Laterality Date  . Knee arthroscopy Left 1990's  . Total knee arthroplasty Left 08/22/2014    Procedure: LEFT TOTAL KNEE ARTHROPLASTY;  Surgeon: Kathryne Hitch, MD;  Location: [facility];  Service: Orthopedics;  Laterality: Left;    There were no vitals taken for this visit.  Visit Diagnosis:  Knee stiffness, left  Edema  Left knee pain      Subjective Assessment - 09/29/14 1211    Symptoms Can walkk without device but dont want RT knee to start hurting more   Currently in Pain? Yes   Pain Score 1    Pain Location Knee   Pain Orientation Left   Multiple Pain Sites Yes   Pain Score 1   Pain Type Chronic pain   Pain Location Knee   Pain Orientation Right          [facility] PT Assessment - 09/29/14 0001    AROM   Left Knee Extension 11   Left Knee Flexion 82                  [facility] Adult PT Treatment/Exercise - 09/29/14 1213    Knee/Hip Exercises: Aerobic   Stationary Bike Nustep L4 8 mn UE and LE   Knee/Hip Exercises: Standing   Heel Raises 1 set;20 reps   Lateral Step Up Left;1 set;15 reps   Knee/Hip Exercises: Seated   Long Arc Quad Left;2 sets;15 reps   Manual Therapy   Manual Lymphatic Drainage (MLD) Reetrograde massage   Passive ROM Emphasis on flexion and extension supine and sittign                  PT Short Term Goals - 09/26/14 1142    PT SHORT TERM GOAL #1   Title independent with HEP (10/14/14)   Status Achieved   PT SHORT TERM GOAL #2   Title improve L knee AROM 5-75 for improved function (10/14/14)   Status On-going   PT SHORT TERM GOAL #3   Title improve gait velocity to > 1.0 ft/sec for improved mobility (10/14/14)   Status On-going   PT SHORT TERM GOAL #4   Title ambulate > 250' with LRAD modified independent for improved mobility (10/14/14)   Status On-going           PT Long Term Goals - 09/16/14 1313    PT LONG TERM GOAL #1   Title independent with advanced HEP (11/11/14)   Time 8   Period Weeks   Status New   PT LONG TERM GOAL #2   Title improve L knee AROM 0-95 for  improved motion and mobility (11/11/14)   Time 8   Period Weeks   Status New   PT LONG TERM GOAL #3   Title improve gait velocity to > 1.5 ft/sec for improved mobility (11/11/14)   Time 8   Period Weeks   Status New   PT LONG TERM GOAL #4   Title demonstrate at least 4/5 L hip strength for improved gait and mobility (11/11/14)   Time 8   Period Weeks   Status New               Plan - 09/29/14 1232    Clinical Impression Statement Flexion range increased and he tolerated stretching with no significant incr pain   PT Next Visit Plan Cont stretntgh with weight and manual for range , game ready   PT Home Exercise Plan Cont same HEP   Consulted and Agree with Plan of Care Patient        Problem List Patient Active Problem List   Diagnosis Date Noted  . Ileus, postoperative   . Acute renal failure with tubular necrosis   . Acute renal failure 08/23/2014  . Abdominal distension 08/23/2014  . Hyperkalemia 08/23/2014  . Arthritis of right knee 08/22/2014  . Status post total left knee replacement 08/22/2014  . Chest pain  02/22/2013  . HTN (hypertension) 02/22/2013  . HLD (hyperlipidemia) 02/22/2013  . Diabetes 02/22/2013    Caprice RedChasse, Rylyn Zawistowski M PT 09/29/2014, 12:34 PM  [facility] [address] [place], [place], [address] Phone: [phone]   Fax:  [phone]

## 2014-10-01 ENCOUNTER — Ambulatory Visit: Payer: Medicare PPO | Admitting: Rehabilitation

## 2014-10-01 DIAGNOSIS — M25562 Pain in left knee: Secondary | ICD-10-CM

## 2014-10-01 DIAGNOSIS — Z96652 Presence of left artificial knee joint: Secondary | ICD-10-CM | POA: Diagnosis not present

## 2014-10-01 DIAGNOSIS — R609 Edema, unspecified: Secondary | ICD-10-CM

## 2014-10-01 DIAGNOSIS — M25662 Stiffness of left knee, not elsewhere classified: Secondary | ICD-10-CM

## 2014-10-01 NOTE — Therapy (Signed)
[facility] [address] [place], [place], [address] Phone: [phone]   Fax:  [phone]  Physical Therapy Treatment  Patient Details  Name: Christopher MightyJohn B Lege MRN: [id number] Date of Birth: [birth year] Referring Provider:  Aida PufferLittle, James, MD  Encounter Date: 10/01/2014      PT End of Session - 10/01/14 1237    Visit Number 5   Number of Visits 16   Date for PT Re-Evaluation 11/15/14   PT Start Time 1145   PT Stop Time 1235   PT Time Calculation (min) 50 min      Past Medical History  Diagnosis Date  . Hypertension   . High cholesterol   . Type II diabetes mellitus   . Arthritis     Past Surgical History  Procedure Laterality Date  . Knee arthroscopy Left 1990's  . Total knee arthroplasty Left 08/22/2014    Procedure: LEFT TOTAL KNEE ARTHROPLASTY;  Surgeon: Kathryne Hitchhristopher Y Blackman, MD;  [facility];  Service: Orthopedics;  Laterality: Left;    There were no vitals taken for this visit.  Visit Diagnosis:  Knee stiffness, left  Edema  Left knee pain  Status post total left knee replacement      Subjective Assessment - 10/01/14 1150    Symptoms Pain is not bad.   Currently in Pain? Yes   Pain Score 1    Pain Orientation Left   Pain Descriptors / Indicators Sore   Pain Type Surgical pain   Aggravating Factors  stretches, prolonged walk, stand   Pain Relieving Factors sitting, rest, meds                    [facility] Adult PT Treatment/Exercise - 10/01/14 1148    Knee/Hip Exercises: Aerobic   Stationary Bike Nustep L4 10 mn UE and LE   Knee/Hip Exercises: Standing   Heel Raises 1 set;20 reps   Knee Flexion Left;15 reps   Lateral Step Up 10 reps;Hand Hold: 1;Step Height: 4"   Forward Step Up 2 sets;20 reps;Step Height: 4";Step Height: 6"  1 set 4 inch 1 set 6 inch cues for posture reqd   Functional Squat 10 reps   Functional Squat Limitations mini squats   Other Standing Knee Exercises  sit-stand x 10 with elevated seat   Knee/Hip Exercises: Seated   Long Arc Quad Left;2 sets;15 reps   Long Arc Quad Weight 2 lbs.   Knee/Hip Exercises: Supine   Quad Sets AROM;Left;3 sets;15 reps  verbal and tactile cues, poor VMO   Short Arc Quad Sets Strengthening;Left;4 sets;10 reps   Short Arc Quad Sets Limitations added 2 # last 2 sets   Heel Slides AAROM;Left;5 reps   Heel Slides Limitations --  with strap   Straight Leg Raises Strengthening;Left;1 set;10 reps  12 reps   Straight Leg Raises Limitations 2#                  PT Short Term Goals - 09/26/14 1142    PT SHORT TERM GOAL #1   Title independent with HEP (10/14/14)   Status Achieved   PT SHORT TERM GOAL #2   Title improve L knee AROM 5-75 for improved function (10/14/14)   Status On-going   PT SHORT TERM GOAL #3   Title improve gait velocity to > 1.0 ft/sec for improved mobility (10/14/14)   Status On-going   PT SHORT TERM GOAL #4   Title ambulate > 250' with LRAD modified independent for  improved mobility (10/14/14)   Status On-going           PT Long Term Goals - 09/16/14 1313    PT LONG TERM GOAL #1   Title independent with advanced HEP (11/11/14)   Time 8   Period Weeks   Status New   PT LONG TERM GOAL #2   Title improve L knee AROM 0-95 for improved motion and mobility (11/11/14)   Time 8   Period Weeks   Status New   PT LONG TERM GOAL #3   Title improve gait velocity to > 1.5 ft/sec for improved mobility (11/11/14)   Time 8   Period Weeks   Status New   PT LONG TERM GOAL #4   Title demonstrate at least 4/5 L hip strength for improved gait and mobility (11/11/14)   Time 8   Period Weeks   Status New               Plan - 10/01/14 1238    Clinical Impression Statement Poor VMO, good tolerance to step ups without incerased pain.    PT Next Visit Plan Cont stretntgh with weight and manual for range , game ready        Problem List Patient Active Problem List   Diagnosis Date Noted   . Ileus, postoperative   . Acute renal failure with tubular necrosis   . Acute renal failure 08/23/2014  . Abdominal distension 08/23/2014  . Hyperkalemia 08/23/2014  . Arthritis of right knee 08/22/2014  . Status post total left knee replacement 08/22/2014  . Chest pain 02/22/2013  . HTN (hypertension) 02/22/2013  . HLD (hyperlipidemia) 02/22/2013  . Diabetes 02/22/2013    Sherrie Mustache, PTA 10/01/2014, 12:40 PM  [facility] [address] [place], [place], [address] Phone: [phone]   Fax:  [phone]

## 2014-10-03 ENCOUNTER — Ambulatory Visit: Payer: Medicare PPO | Admitting: Physical Therapy

## 2014-10-03 DIAGNOSIS — M25562 Pain in left knee: Secondary | ICD-10-CM

## 2014-10-03 DIAGNOSIS — Z96652 Presence of left artificial knee joint: Secondary | ICD-10-CM

## 2014-10-03 DIAGNOSIS — R609 Edema, unspecified: Secondary | ICD-10-CM

## 2014-10-03 DIAGNOSIS — M25662 Stiffness of left knee, not elsewhere classified: Secondary | ICD-10-CM

## 2014-10-03 NOTE — Therapy (Signed)
[facility] [address] [place], [place], [address] Phone: [phone]   Fax:  [phone]  Physical Therapy Treatment  Patient Details  Name: Christopher Rasmussen MRN: [id number] Date of Birth: [birth year] Referring Provider:  Aida PufferLittle, James, MD  Encounter Date: 10/03/2014      PT End of Session - 10/03/14 1136    Visit Number 6   Number of Visits 16   Date for PT Re-Evaluation 11/15/14   PT Start Time 1100   PT Stop Time 1147   PT Time Calculation (min) 47 min   Activity Tolerance Patient tolerated treatment well   Behavior During Therapy [facility] for tasks assessed/performed      Past Medical History  Diagnosis Date  . Hypertension   . High cholesterol   . Type II diabetes mellitus   . Arthritis     Past Surgical History  Procedure Laterality Date  . Knee arthroscopy Left 1990's  . Total knee arthroplasty Left 08/22/2014    Procedure: LEFT TOTAL KNEE ARTHROPLASTY;  Surgeon: Kathryne Hitchhristopher Y Blackman, MD;  [facility];  Service: Orthopedics;  Laterality: Left;    There were no vitals taken for this visit.  Visit Diagnosis:  Knee stiffness, left  Left knee pain  Edema  Status post total left knee replacement      Subjective Assessment - 10/03/14 1107    Symptoms Knee feels good; about a 1/10   Pertinent History R knee OA; HTN, DM   Limitations Standing;Walking   Currently in Pain? Yes   Pain Score 1    Pain Location Knee   Pain Orientation Left   Pain Descriptors / Indicators Sore   Pain Type Surgical pain   Pain Onset More than a month ago                    [facility] PT Treatment/Exercise - 10/03/14 1108    Knee/Hip Exercises: Aerobic   Stationary Bike Nustep L4 10 mn UE and LE   Knee/Hip Exercises: Seated   Long Arc Quad Left;2 sets;15 reps;Weights   Long Arc Quad Weight 2 lbs.   Knee/Hip Exercises: Supine   Short Arc Quad Sets Strengthening;Left;2 sets;15 reps   Short Arc Quad Sets  Limitations 2#   Straight Leg Raises Strengthening;Left;15 reps   Straight Leg Raises Limitations 2#   Straight Leg Raise with External Rotation Strengthening;Left;15 reps   Modalities   Modalities Cryotherapy   Cryotherapy   Number Minutes Cryotherapy 12 Minutes   Cryotherapy Location Knee   Type of Cryotherapy Other (comment)  vasopneumatic mod pressure   Manual Therapy   Manual Therapy Passive ROM   Passive ROM prone knee flexion with max cues to relax                  PT Short Term Goals - 10/03/14 1137    PT SHORT TERM GOAL #1   Title independent with HEP (10/14/14)   Status Achieved   PT SHORT TERM GOAL #2   Title improve L knee AROM 5-75 for improved function (10/14/14)   Status On-going   PT SHORT TERM GOAL #3   Title improve gait velocity to > 1.0 ft/sec for improved mobility (10/14/14)   Status On-going   PT SHORT TERM GOAL #4   Title ambulate > 250' with LRAD modified independent for improved mobility (10/14/14)   Status On-going           PT Long Term Goals - 10/03/14  1137    PT LONG TERM GOAL #1   Title independent with advanced HEP (11/11/14)   Status On-going   PT LONG TERM GOAL #2   Title improve L knee AROM 0-95 for improved motion and mobility (11/11/14)   Status On-going   PT LONG TERM GOAL #3   Title improve gait velocity to > 1.5 ft/sec for improved mobility (11/11/14)   Status On-going   PT LONG TERM GOAL #4   Title demonstrate at least 4/5 L hip strength for improved gait and mobility (11/11/14)   Status On-going               Plan - 10/03/14 1136    Clinical Impression Statement Improved quad contraction today; VMO still weak.  Will continue to benefit from PT to maximize function and improve mobility.   PT Next Visit Plan Cont strength with weight and manual for range , game ready   PT Home Exercise Plan Cont same HEP   Consulted and Agree with Plan of Care Patient        Problem List Patient Active Problem List   Diagnosis  Date Noted  . Ileus, postoperative   . Acute renal failure with tubular necrosis   . Acute renal failure 08/23/2014  . Abdominal distension 08/23/2014  . Hyperkalemia 08/23/2014  . Arthritis of right knee 08/22/2014  . Status post total left knee replacement 08/22/2014  . Chest pain 02/22/2013  . HTN (hypertension) 02/22/2013  . HLD (hyperlipidemia) 02/22/2013  . Diabetes 02/22/2013   Clarita Crane, PT, DPT 10/03/2014 11:49 AM  [facility] [address] [place], [place], [address] Phone: [phone]   Fax:  [phone]

## 2014-10-06 ENCOUNTER — Ambulatory Visit: Payer: Medicare PPO | Admitting: Physical Therapy

## 2014-10-06 DIAGNOSIS — Z96652 Presence of left artificial knee joint: Secondary | ICD-10-CM | POA: Diagnosis not present

## 2014-10-06 DIAGNOSIS — M25562 Pain in left knee: Secondary | ICD-10-CM

## 2014-10-06 DIAGNOSIS — R609 Edema, unspecified: Secondary | ICD-10-CM

## 2014-10-06 DIAGNOSIS — M25662 Stiffness of left knee, not elsewhere classified: Secondary | ICD-10-CM

## 2014-10-06 NOTE — Therapy (Signed)
[facility] [address] [place], [place], [address] Phone: [phone]   Fax:  [phone]  Physical Therapy Treatment  Patient Details  Name: Christopher MightyJohn B Sadiq MRN: [id number] Date of Birth: 12/29/1945 Referring Provider:  Kathryne HitchBlackman, Christopher Y*  Encounter Date: 10/06/2014      PT End of Session - 10/06/14 1224    Visit Number 7   Number of Visits 16   Date for PT Re-Evaluation 11/15/14   PT Start Time 1147   PT Stop Time 1235   PT Time Calculation (min) 48 min   Activity Tolerance Patient tolerated treatment well   Behavior During Therapy [facility] for tasks assessed/performed      Past Medical History  Diagnosis Date  . Hypertension   . High cholesterol   . Type II diabetes mellitus   . Arthritis     Past Surgical History  Procedure Laterality Date  . Knee arthroscopy Left 1990's  . Total knee arthroplasty Left 08/22/2014    Procedure: LEFT TOTAL KNEE ARTHROPLASTY;  Surgeon: Kathryne Hitchhristopher Y Blackman, MD;  Location[facility];  Service: Orthopedics;  Laterality: Left;    There were no vitals taken for this visit.  Visit Diagnosis:  Knee stiffness, left  Left knee pain  Edema  Status post total left knee replacement      Subjective Assessment - 10/06/14 1159    Symptoms R knee very painful today; did a lot of walking yesterday. L knee feels great   Pertinent History R knee OA; HTN, DM   Limitations Standing;Walking   How long can you stand comfortably? 5-10 min   How long can you walk comfortably? 5-10 min   Patient Stated Goals to improve L knee function; improve mobility   Currently in Pain? Yes   Pain Score 1    Pain Location Knee   Pain Orientation Left   Pain Descriptors / Indicators Sore   Pain Type Surgical pain   Pain Onset More than a month ago   Pain Frequency Constant   Pain Score 4   Pain Type Chronic pain   Pain Location Knee   Pain Orientation Right                    [facility]  Adult PT Treatment/Exercise - 10/06/14 1201    Knee/Hip Exercises: Aerobic   Stationary Bike Nustep L3 10 mn UE and LE   Knee/Hip Exercises: Supine   Heel Prop for Knee Extension 5 minutes   Heel Prop for Knee Extension Weight (lbs) ice pack   Straight Leg Raises Strengthening;Left;20 reps   Straight Leg Raises Limitations 2#   Straight Leg Raise with External Rotation Strengthening;Left;20 reps   Straight Leg Raise with External Rotation Limitations 2#   Knee/Hip Exercises: Sidelying   Hip ABduction Strengthening;Left;20 reps   Hip ABduction Limitations 2#; mod cues to decrease external rotation of hip   Knee/Hip Exercises: Prone   Hamstring Curl 2 sets;10 reps   Straight Leg Raises Strengthening;Left;2 sets;10 reps   Straight Leg Raises Limitations 2#   Modalities   Modalities Cryotherapy   Cryotherapy   Number Minutes Cryotherapy 10 Minutes   Cryotherapy Location Knee   Type of Cryotherapy Ice pack                  PT Short Term Goals - 10/03/14 1137    PT SHORT TERM GOAL #1   Title independent with HEP (10/14/14)   Status Achieved   PT SHORT  TERM GOAL #2   Title improve L knee AROM 5-75 for improved function (10/14/14)   Status On-going   PT SHORT TERM GOAL #3   Title improve gait velocity to > 1.0 ft/sec for improved mobility (10/14/14)   Status On-going   PT SHORT TERM GOAL #4   Title ambulate > 250' with LRAD modified independent for improved mobility (10/14/14)   Status On-going           PT Long Term Goals - 10/03/14 1137    PT LONG TERM GOAL #1   Title independent with advanced HEP (11/11/14)   Status On-going   PT LONG TERM GOAL #2   Title improve L knee AROM 0-95 for improved motion and mobility (11/11/14)   Status On-going   PT LONG TERM GOAL #3   Title improve gait velocity to > 1.5 ft/sec for improved mobility (11/11/14)   Status On-going   PT LONG TERM GOAL #4   Title demonstrate at least 4/5 L hip strength for improved gait and mobility (11/11/14)    Status On-going               Plan - 10/06/14 1225    Clinical Impression Statement Extension lacking but no significant quad lag today.  Overall strength improving; RLE limits progress and ability to progress standing exercises.   PT Next Visit Plan Cont strength with weight and manual for range; extension and flexion!   Consulted and Agree with Plan of Care Patient        Problem List Patient Active Problem List   Diagnosis Date Noted  . Ileus, postoperative   . Acute renal failure with tubular necrosis   . Acute renal failure 08/23/2014  . Abdominal distension 08/23/2014  . Hyperkalemia 08/23/2014  . Arthritis of right knee 08/22/2014  . Status post total left knee replacement 08/22/2014  . Chest pain 02/22/2013  . HTN (hypertension) 02/22/2013  . HLD (hyperlipidemia) 02/22/2013  . Diabetes 02/22/2013   Clarita Crane, PT, DPT 10/06/2014 12:42 PM  [facility] [address] [place], [place], [address] Phone: [phone]   Fax:  [phone]

## 2014-10-08 ENCOUNTER — Ambulatory Visit: Payer: Medicare PPO | Attending: Orthopaedic Surgery | Admitting: Rehabilitation

## 2014-10-08 DIAGNOSIS — M25562 Pain in left knee: Secondary | ICD-10-CM | POA: Insufficient documentation

## 2014-10-08 DIAGNOSIS — M25662 Stiffness of left knee, not elsewhere classified: Secondary | ICD-10-CM | POA: Diagnosis not present

## 2014-10-08 DIAGNOSIS — R609 Edema, unspecified: Secondary | ICD-10-CM

## 2014-10-08 DIAGNOSIS — Z96652 Presence of left artificial knee joint: Secondary | ICD-10-CM | POA: Insufficient documentation

## 2014-10-08 NOTE — Therapy (Signed)
[facility] [address] [place], [place], [address] Phone: [phone]   Fax:  [phone]  Physical Therapy Treatment  Patient Details  Name: Christopher Rasmussen B Dobrowski MRN: [id number] Date of Birth: May 09, 1946 Referring Provider:  Aida PufferLittle, James, MD  Encounter Date: 10/08/2014      PT End of Session - 10/08/14 1321    Visit Number 8   Number of Visits 16   Date for PT Re-Evaluation 11/15/14   PT Start Time 1145   PT Stop Time 1225   PT Time Calculation (min) 40 min      Past Medical History  Diagnosis Date  . Hypertension   . High cholesterol   . Type II diabetes mellitus   . Arthritis     Past Surgical History  Procedure Laterality Date  . Knee arthroscopy Left 1990's  . Total knee arthroplasty Left 08/22/2014    Procedure: LEFT TOTAL KNEE ARTHROPLASTY;  Surgeon: Kathryne Hitchhristopher Y Blackman, MD;  Location[facility];  Service: Orthopedics;  Laterality: Left;    There were no vitals taken for this visit.  Visit Diagnosis:  Knee stiffness, left  Left knee pain  Edema  Status post total left knee replacement      Subjective Assessment - 10/08/14 1202    Symptoms I am still recovering from a lot of walking on Sunday 5/10 right knee, left knee just sore.    Currently in Pain? --          [facility] - 10/08/14 1226    AROM   Left Knee Flexion 83  after treatment                  [facility] Adult PT Treatment/Exercise - 10/08/14 1204    Knee/Hip Exercises: Aerobic   Stationary Bike Nustep L5 10 mn UE and LE   Knee/Hip Exercises: Seated   Long Arc Quad Left;2 sets;10 reps   Long Arc Quad Weight 3 lbs.   Other Seated Knee Exercises seated hamstring curls green band x 20   Knee/Hip Exercises: Prone   Other Prone Exercises passive knee flexion stretch 3 x 30 seconds   Manual Therapy   Manual Therapy Joint mobilization;Passive ROM   Joint Mobilization patella mobs all planes and grade 3 flexion and extension  mobs supine    Passive ROM prone knee flexion with max cues to relax                  PT Short Term Goals - 10/03/14 1137    PT SHORT TERM GOAL #1   Title independent with HEP (10/14/14)   Status Achieved   PT SHORT TERM GOAL #2   Title improve L knee AROM 5-75 for improved function (10/14/14)   Status On-going   PT SHORT TERM GOAL #3   Title improve gait velocity to > 1.0 ft/sec for improved mobility (10/14/14)   Status On-going   PT SHORT TERM GOAL #4   Title ambulate > 250' with LRAD modified independent for improved mobility (10/14/14)   Status On-going           PT Long Term Goals - 10/03/14 1137    PT LONG TERM GOAL #1   Title independent with advanced HEP (11/11/14)   Status On-going   PT LONG TERM GOAL #2   Title improve L knee AROM 0-95 for improved motion and mobility (11/11/14)   Status On-going   PT LONG TERM GOAL #3   Title improve gait velocity  to > 1.5 ft/sec for improved mobility (11/11/14)   Status On-going   PT LONG TERM GOAL #4   Title demonstrate at least 4/5 L hip strength for improved gait and mobility (11/11/14)   Status On-going               Plan - 10/08/14 1317    Clinical Impression Statement Pt required increased time today ambulating in/out of gym as well as with transitions due to c/o pain in bilateral ankles, non surgical knee and ball of bilateral feet. He attributes this pain to increased ambulation 3 days ago in/ ut of church.     PT Next Visit Plan Cont strength with weight and manual for range; extension and flexion!        Problem List Patient Active Problem List   Diagnosis Date Noted  . Ileus, postoperative   . Acute renal failure with tubular necrosis   . Acute renal failure 08/23/2014  . Abdominal distension 08/23/2014  . Hyperkalemia 08/23/2014  . Arthritis of right knee 08/22/2014  . Status post total left knee replacement 08/22/2014  . Chest pain 02/22/2013  . HTN (hypertension) 02/22/2013  . HLD (hyperlipidemia)  02/22/2013  . Diabetes 02/22/2013    Sherrie Mustache, PTA 10/08/2014, 1:22 PM  [facility] [address] [place], [place], [address] Phone: [phone]   Fax:  [phone]

## 2014-10-10 ENCOUNTER — Other Ambulatory Visit (HOSPITAL_COMMUNITY): Payer: Self-pay | Admitting: Orthopaedic Surgery

## 2014-10-10 ENCOUNTER — Ambulatory Visit: Payer: Medicare PPO

## 2014-10-10 DIAGNOSIS — R29898 Other symptoms and signs involving the musculoskeletal system: Secondary | ICD-10-CM

## 2014-10-10 DIAGNOSIS — Z96652 Presence of left artificial knee joint: Secondary | ICD-10-CM | POA: Diagnosis not present

## 2014-10-10 DIAGNOSIS — M25662 Stiffness of left knee, not elsewhere classified: Secondary | ICD-10-CM

## 2014-10-10 NOTE — Therapy (Signed)
[facility] Outpatient Rehabilitation [facility] [address] [place], [place], [address] Phone: [phone]   Fax:  [phone]  Physical Therapy Treatment  Patient Details  Name: Christopher Rasmussen MRN: [id number] Date of Birth: 05-Sep-1945 Referring Provider:  Aida Puffer, MD  Encounter Date: 10/10/2014      PT End of Session - 10/10/14 1141    Visit Number 9   Number of Visits 12   Date for PT Re-Evaluation 10/29/14   Authorization Type [employer]   Authorization Time Period until 10/29/14   Authorization - Visit Number 12   Authorization - Number of Visits 12   PT Start Time 1100   PT Stop Time 1143   PT Time Calculation (min) 43 min   Activity Tolerance Patient tolerated treatment well   Behavior During Therapy [facility] for tasks assessed/performed      Past Medical History  Diagnosis Date  . Hypertension   . High cholesterol   . Type II diabetes mellitus   . Arthritis     Past Surgical History  Procedure Laterality Date  . Knee arthroscopy Left 1990's  . Total knee arthroplasty Left 08/22/2014    Procedure: LEFT TOTAL KNEE ARTHROPLASTY;  Surgeon: Kathryne Hitch, MD;  Location: [facility];  Service: Orthopedics;  Laterality: Left;    There were no vitals taken for this visit.  Visit Diagnosis:  Knee stiffness, left  Weakness of left lower extremity      Subjective Assessment - 10/10/14 1120    Symptoms I am having a MUA next week to LT knee for flexion.    Currently in Pain? Yes   Pain Score 1    Multiple Pain Sites Yes   Pain Score 4          [facility] PT Assessment - 10/10/14 0001    AROM   Left Knee Extension -20   Left Knee Flexion 82  supine   PROM   Left Knee Extension -14   Left Knee Flexion 85                  [facility] Adult PT Treatment/Exercise - 10/10/14 1121    Knee/Hip Exercises: Aerobic   Stationary Bike Nustep L5 10 mn UE and LE   Knee/Hip Exercises: Seated   Long Arc Quad Strengthening;Left;1 set  30 reps   Long Arc Quad Weight 5 lbs.   Other Seated Knee Exercises seated hamstring curls green band x 20   Knee/Hip Exercises: Supine   Short Arc Quad Sets AROM;Left;1 set;20 reps   Short Arc Quad Sets Limitations -23 degrees active      Manual stretching for extension LT knee          PT Education - 10/10/14 1141    Education provided Yes   Education Details discussed MUA and need for range frequently after procedure.    Person(s) Educated Patient   Methods Explanation   Comprehension Verbalized understanding          PT Short Term Goals - 10/10/14 1144    PT SHORT TERM GOAL #1   Title independent with HEP (10/14/14)   Status Achieved   PT SHORT TERM GOAL #2   Title improve L knee AROM 5-75 for improved function (10/14/14)   Status On-going   PT SHORT TERM GOAL #3   Title improve gait velocity to > 1.0 ft/sec for improved mobility (10/14/14)   Status On-going   PT SHORT TERM GOAL #4   Title ambulate > 250' with  LRAD modified independent for improved mobility (10/14/14)   Status On-going           PT Long Term Goals - 10/10/14 1145    PT LONG TERM GOAL #1   Title independent with advanced HEP (11/11/14)   Status On-going   PT LONG TERM GOAL #2   Title improve L knee AROM 0-95 for improved motion and mobility (11/11/14)   Status On-going   PT LONG TERM GOAL #3   Title improve gait velocity to > 1.5 ft/sec for improved mobility (11/11/14)   Status On-going   PT LONG TERM GOAL #4   Title demonstrate at least 4/5 L hip strength for improved gait and mobility (11/11/14)   Status On-going               Plan - 10/10/14 1146    PT Next Visit Plan Gaol measurements        Problem List Patient Active Problem List   Diagnosis Date Noted  . Ileus, postoperative   . Acute renal failure with tubular necrosis   . Acute renal failure 08/23/2014  . Abdominal distension 08/23/2014  . Hyperkalemia 08/23/2014  . Arthritis of right knee 08/22/2014  . Status post total left  knee replacement 08/22/2014  . Chest pain 02/22/2013  . HTN (hypertension) 02/22/2013  . HLD (hyperlipidemia) 02/22/2013  . Diabetes 02/22/2013    Caprice RedChasse, Jaleea Alesi M PT 10/10/2014, 11:47 AM  [facility] [address] [place], [place], [address] Phone: [phone]   Fax:  [phone]

## 2014-10-13 ENCOUNTER — Ambulatory Visit: Payer: Medicare PPO | Admitting: Rehabilitation

## 2014-10-13 DIAGNOSIS — R29898 Other symptoms and signs involving the musculoskeletal system: Secondary | ICD-10-CM

## 2014-10-13 DIAGNOSIS — M25662 Stiffness of left knee, not elsewhere classified: Secondary | ICD-10-CM

## 2014-10-13 DIAGNOSIS — M25562 Pain in left knee: Secondary | ICD-10-CM

## 2014-10-13 DIAGNOSIS — R609 Edema, unspecified: Secondary | ICD-10-CM

## 2014-10-13 DIAGNOSIS — Z96652 Presence of left artificial knee joint: Secondary | ICD-10-CM | POA: Diagnosis not present

## 2014-10-13 NOTE — Therapy (Signed)
[facility] Outpatient Rehabilitation [facility] [address] [place], [place], [address] Phone: [phone]   Fax:  [phone]  Physical Therapy Treatment  Patient Details  Name: Christopher Rasmussen MRN: [id number] Date of Birth: 08-23-1945 Referring Provider:  Aida Puffer, MD  Encounter Date: 10/13/2014      PT End of Session - 10/13/14 1134    Visit Number 10   Number of Visits 12   Date for PT Re-Evaluation 10/29/14   PT Start Time 1102   PT Stop Time 1145   PT Time Calculation (min) 43 min      Past Medical History  Diagnosis Date  . Hypertension   . High cholesterol   . Type II diabetes mellitus   . Arthritis     Past Surgical History  Procedure Laterality Date  . Knee arthroscopy Left 1990's  . Total knee arthroplasty Left 08/22/2014    Procedure: LEFT TOTAL KNEE ARTHROPLASTY;  Surgeon: Kathryne Hitch, MD;  Location: [facility];  Service: Orthopedics;  Laterality: Left;    There were no vitals taken for this visit.  Visit Diagnosis:  Knee stiffness, left  Weakness of left lower extremity  Edema  Status post total left knee replacement  Left knee pain        [facility] PT Assessment - 10/13/14 1113    AROM   Left Knee Extension -16   Left Knee Flexion 83                  [facility] Adult PT Treatment/Exercise - 10/13/14 1105    Ambulation/Gait   Ambulation/Gait Yes   Ambulation/Gait Assistance 6: Modified independent (Device/Increase time)   Ambulation Distance (Feet) 250 Feet   Assistive device Rolling walker   Gait Pattern Step-to pattern   Ambulation Surface Level;Indoor   Gait velocity 1/7 ft/sec   Knee/Hip Exercises: Aerobic   Stationary Bike Nustep L6 6 mn UE and LE   Knee/Hip Exercises: Standing   Lateral Step Up 20 reps;Hand Hold: 1;Step Height: 4"   Forward Step Up 2 sets;20 reps;Step Height: 6"  1 set 4 inch 1 set 6 inch cues for posture reqd   Knee/Hip Exercises: Seated   Long Arc Quad Left;2 sets;10 reps   Long Arc Quad Weight 6 lbs.   Other Seated Knee Exercises seated hamstring curls green band x 20   Knee/Hip Exercises: Supine   Quad Sets AROM;Left;3 sets;5 sets  verbal and tactile cues, poor VMO   Short Arc Quad Sets 2 sets;10 reps  5#   Heel Slides Left;10 reps   Straight Leg Raises Strengthening;Left;20 reps   Straight Leg Raises Limitations 2#   Knee/Hip Exercises: Prone   Hamstring Curl 2 sets;10 reps  3#   Manual Therapy   Manual Therapy Joint mobilization;Passive ROM   Joint Mobilization patella mobs all planes and grade 3 flexion and extension mobs supine    Passive ROM prone knee flexion with max cues to relax                  PT Short Term Goals - 10/13/14 1210    PT SHORT TERM GOAL #1   Title independent with HEP (10/14/14)   Time 4   Period Weeks   Status Achieved   PT SHORT TERM GOAL #2   Title improve L knee AROM 5-75 for improved function (10/14/14)   Time 4   Period Weeks   Status On-going   PT SHORT TERM GOAL #3   Title improve  gait velocity to > 1.0 ft/sec for improved mobility (10/14/14)   Time 4   Period Weeks   Status Achieved  1.7 ft/sec with RW   PT SHORT TERM GOAL #4   Title ambulate > 250' with LRAD modified independent for improved mobility (10/14/14)   Time 4   Period Weeks   Status Achieved           PT Long Term Goals - 10/10/14 1145    PT LONG TERM GOAL #1   Title independent with advanced HEP (11/11/14)   Status On-going   PT LONG TERM GOAL #2   Title improve L knee AROM 0-95 for improved motion and mobility (11/11/14)   Status On-going   PT LONG TERM GOAL #3   Title improve gait velocity to > 1.5 ft/sec for improved mobility (11/11/14)   Status On-going   PT LONG TERM GOAL #4   Title demonstrate at least 4/5 L hip strength for improved gait and mobility (11/11/14)   Status On-going               Plan - 10/13/14 1223    Clinical Impression Statement STG # 1,3,4 Achieved. One more visit before MUA procedure   PT Next  Visit Plan FOTO        Problem List Patient Active Problem List   Diagnosis Date Noted  . Ileus, postoperative   . Acute renal failure with tubular necrosis   . Acute renal failure 08/23/2014  . Abdominal distension 08/23/2014  . Hyperkalemia 08/23/2014  . Arthritis of right knee 08/22/2014  . Status post total left knee replacement 08/22/2014  . Chest pain 02/22/2013  . HTN (hypertension) 02/22/2013  . HLD (hyperlipidemia) 02/22/2013  . Diabetes 02/22/2013    Sherrie Mustacheonoho, Marjean Imperato McGee , PTA  10/13/2014, 12:58 PM  [facility] [address] [place], [place], [address] Phone: [phone]   Fax:  [phone]

## 2014-10-14 ENCOUNTER — Ambulatory Visit: Payer: Medicare PPO

## 2014-10-14 ENCOUNTER — Other Ambulatory Visit (HOSPITAL_COMMUNITY): Payer: Self-pay | Admitting: *Deleted

## 2014-10-14 DIAGNOSIS — M25662 Stiffness of left knee, not elsewhere classified: Secondary | ICD-10-CM

## 2014-10-14 DIAGNOSIS — R29898 Other symptoms and signs involving the musculoskeletal system: Secondary | ICD-10-CM

## 2014-10-14 DIAGNOSIS — Z96652 Presence of left artificial knee joint: Secondary | ICD-10-CM | POA: Diagnosis not present

## 2014-10-14 NOTE — Patient Instructions (Addendum)
Youlanda MightyJohn B Beebe  10/14/2014   Your procedure is scheduled on:  Friday October 17, 2014  Report to [facility] Main  Entrance and follow signs to               Short Stay Center at 200 PM  Call this number if you have problems the morning of surgery [phone]   Remember:  Do not eat food:After Midnight Thursday Night, clear liquids after midnight Thursday until 1000 am Friday, no clear liquids after 1000 am Friday.      Take these medicines the morning of surgery with A SIP OF WATER: AMLODIPINE, OXYCODONE IF NEEDED                               You may not have any metal on your body including hair pins and              piercings  Do not wear jewelry, make-up, lotions, powders or perfumes.             Do not wear nail polish.  Do not shave  48 hours prior to surgery.              Men may shave face and neck.   Do not bring valuables to the hospital. [place] IS NOT             RESPONSIBLE   FOR VALUABLES.  Contacts, dentures or bridgework may not be worn into surgery.  Leave suitcase in the car. After surgery it may be brought to your room.     Patients discharged the day of surgery will not be allowed to drive home.  Name and phone number of your driver:  Special Instructions: N/A              Please read over the following fact sheets you were given: _____________________________________________________________________             [facility] Pre[facility] Before surgery, you can play an important role.  Because skin is not sterile, your skin needs to be as free of germs as possible.  You can reduce the number of germs on your skin by washing with CHG (chlorahexidine gluconate) soap before surgery.  CHG is an antiseptic cleaner which kills germs and bonds with the skin to continue killing germs even after washing. Please DO NOT use if you have an allergy to CHG or antibacterial soaps.  If your skin becomes reddened/irritated stop using the CHG  and inform your nurse when you arrive at Short Stay. Do not shave (including legs and underarms) for at least 48 hours prior to the first CHG shower.  You may shave your face/neck. Please follow these instructions carefully:  1.  Shower with CHG Soap the night before surgery and the  morning of Surgery.  2.  If you choose to wash your hair, wash your hair first as usual with your  normal  shampoo.  3.  After you shampoo, rinse your hair and body thoroughly to remove the  shampoo.                           4.  Use CHG as you would any other liquid soap.  You can apply chg directly  to the skin and wash  Gently with a scrungie or clean washcloth.  5.  Apply the CHG Soap to your body ONLY FROM THE NECK DOWN.   Do not use on face/ open                           Wound or open sores. Avoid contact with eyes, ears mouth and genitals (private parts).                       Wash face,  Genitals (private parts) with your normal soap.             6.  Wash thoroughly, paying special attention to the area where your surgery  will be performed.  7.  Thoroughly rinse your body with warm water from the neck down.  8.  DO NOT shower/wash with your normal soap after using and rinsing off  the CHG Soap.                9.  Pat yourself dry with a clean towel.            10.  Wear clean pajamas.            11.  Place clean sheets on your bed the night of your first shower and do not  sleep with pets. Day of Surgery : Do not apply any lotions/deodorants the morning of surgery.  Please wear clean clothes to the hospital/surgery center.  FAILURE TO FOLLOW THESE INSTRUCTIONS MAY RESULT IN THE CANCELLATION OF YOUR SURGERY PATIENT SIGNATURE_________________________________  NURSE SIGNATURE__________________________________  ________________________________________________________________________   CLEAR LIQUID DIET   Foods Allowed                                                                      Foods Excluded  Coffee and tea, regular and decaf                             liquids that you cannot  Plain Jell-O in any flavor                                             see through such as: Fruit ices (not with fruit pulp)                                     milk, soups, orange juice  Iced Popsicles                                    All solid food Carbonated beverages, regular and diet                                    Cranberry, grape and apple juices Sports drinks like Gatorade Lightly seasoned clear broth or consume(fat free) Sugar, honey syrup  Sample  Menu Breakfast                                Lunch                                     Supper Cranberry juice                    Beef broth                            Chicken broth Jell-O                                     Grape juice                           Apple juice Coffee or tea                        Jell-O                                      Popsicle                                                Coffee or tea                        Coffee or tea  _____________________________________________________________________

## 2014-10-14 NOTE — Progress Notes (Signed)
lov dr Casimiro Needleganjo 10-22-13 on chart bil carotid studt report 10-08-13 dr Jacinto Halimganji on cahrt Echo 03-07-13 dr Jacinto Halimganji on chart ekg 10-22-13 dr Jacinto Halimganji on chart Myocardial perfusion study 03-04-13 dr Jacinto Halimganji on chart Chest xray 08-24-14 epic ekg 08-15-14 epic

## 2014-10-14 NOTE — Therapy (Signed)
[facility] [address] [place], [place], [address] Phone: [phone]   Fax:  [phone]  Physical Therapy Treatment  Patient Details  Name: Christopher MightyJohn B Beddow MRN: [id number] Date of Birth: 22-May-1946 Referring Provider:  Aida PufferLittle, James, MD  Encounter Date: 10/14/2014      PT End of Session - 10/14/14 1321    Visit Number 11   Number of Visits 12   Date for PT Re-Evaluation 10/29/14   PT Start Time 1230   PT Stop Time 1330   PT Time Calculation (min) 60 min   Activity Tolerance Patient tolerated treatment well   Behavior During Therapy [facility] for tasks assessed/performed      Past Medical History  Diagnosis Date  . Hypertension   . High cholesterol   . Type II diabetes mellitus   . Arthritis     Past Surgical History  Procedure Laterality Date  . Knee arthroscopy Left 1990's  . Total knee arthroplasty Left 08/22/2014    Procedure: LEFT TOTAL KNEE ARTHROPLASTY;  Surgeon: Kathryne Hitchhristopher Y Blackman, MD;  Location: [facility];  Service: Orthopedics;  Laterality: Left;    There were no vitals taken for this visit.  Visit Diagnosis:  Knee stiffness, left  Weakness of left lower extremity      Subjective Assessment - 10/14/14 1246    Symptoms LT knee feels good   Currently in Pain? Yes   Pain Score 1           [facility] PT Assessment - 10/13/14 1113    AROM   Left Knee Extension -16   Left Knee Flexion 83                  [facility] Adult PT Treatment/Exercise - 10/14/14 0001    Knee/Hip Exercises: Standing   Lateral Step Up Left;20 reps;Hand Hold: 1;Step Height: 4"   Lateral Step Up Limitations also on 6 inch step   Knee/Hip Exercises: Seated   Long Arc Quad Strengthening;Left;1 set   Con-wayLong Arc Quad Weight 8 lbs.  30 reps   Other Seated Knee Exercises seated hamstring curls blue band x 25   Knee/Hip Exercises: Supine   Heel Prop for Knee Extension 5 minutes   Manual Therapy   Joint Mobilization patella mobs  all planes and grade 3  extension mobs sitting   Passive ROM sitting knee extension                  PT Short Term Goals - 10/13/14 1210    PT SHORT TERM GOAL #1   Title independent with HEP (10/14/14)   Time 4   Period Weeks   Status Achieved   PT SHORT TERM GOAL #2   Title improve L knee AROM 5-75 for improved function (10/14/14)   Time 4   Period Weeks   Status On-going   PT SHORT TERM GOAL #3   Title improve gait velocity to > 1.0 ft/sec for improved mobility (10/14/14)   Time 4   Period Weeks   Status Achieved  1.7 ft/sec with RW   PT SHORT TERM GOAL #4   Title ambulate > 250' with LRAD modified independent for improved mobility (10/14/14)   Time 4   Period Weeks   Status Achieved           PT Long Term Goals - 10/10/14 1145    PT LONG TERM GOAL #1   Title independent with advanced HEP (11/11/14)   Status On-going  PT LONG TERM GOAL #2   Title improve L knee AROM 0-95 for improved motion and mobility (11/11/14)   Status On-going   PT LONG TERM GOAL #3   Title improve gait velocity to > 1.5 ft/sec for improved mobility (11/11/14)   Status On-going   PT LONG TERM GOAL #4   Title demonstrate at least 4/5 L hip strength for improved gait and mobility (11/11/14)   Status On-going               Plan - 10/14/14 1322    Clinical Impression Statement we will hold until next week post MUA   Consulted and Agree with Plan of Care Patient        Problem List Patient Active Problem List   Diagnosis Date Noted  . Ileus, postoperative   . Acute renal failure with tubular necrosis   . Acute renal failure 08/23/2014  . Abdominal distension 08/23/2014  . Hyperkalemia 08/23/2014  . Arthritis of right knee 08/22/2014  . Status post total left knee replacement 08/22/2014  . Chest pain 02/22/2013  . HTN (hypertension) 02/22/2013  . HLD (hyperlipidemia) 02/22/2013  . Diabetes 02/22/2013    Caprice Red PT 10/14/2014, 1:23 PM  [facility] [address] [place], [place], [address] Phone: [phone]   Fax:  [phone]

## 2014-10-15 ENCOUNTER — Encounter (HOSPITAL_COMMUNITY)
Admission: RE | Admit: 2014-10-15 | Discharge: 2014-10-15 | Disposition: A | Discharge status: Home / Self Care | Payer: Medicare PPO | Source: Ambulatory Visit | Attending: Orthopaedic Surgery | Admitting: Orthopaedic Surgery

## 2014-10-15 ENCOUNTER — Encounter (HOSPITAL_COMMUNITY): Payer: Self-pay

## 2014-10-15 DIAGNOSIS — M24662 Ankylosis, left knee: Secondary | ICD-10-CM | POA: Diagnosis present

## 2014-10-15 DIAGNOSIS — I10 Essential (primary) hypertension: Secondary | ICD-10-CM | POA: Diagnosis not present

## 2014-10-15 DIAGNOSIS — Z96652 Presence of left artificial knee joint: Secondary | ICD-10-CM | POA: Diagnosis not present

## 2014-10-15 DIAGNOSIS — E780 Pure hypercholesterolemia: Secondary | ICD-10-CM | POA: Diagnosis not present

## 2014-10-15 DIAGNOSIS — E119 Type 2 diabetes mellitus without complications: Secondary | ICD-10-CM | POA: Diagnosis not present

## 2014-10-15 LAB — CBC
HEMATOCRIT: 40.9 % (ref 39.0–52.0)
HEMOGLOBIN: 13.9 g/dL (ref 13.0–17.0)
MCH: 28.3 pg (ref 26.0–34.0)
MCHC: 34 g/dL (ref 30.0–36.0)
MCV: 83.1 fL (ref 78.0–100.0)
PLATELETS: 294 10*3/uL (ref 150–400)
RBC: 4.92 MIL/uL (ref 4.22–5.81)
RDW: 13.8 % (ref 11.5–15.5)
WBC: 6.8 10*3/uL (ref 4.0–10.5)

## 2014-10-17 ENCOUNTER — Ambulatory Visit (HOSPITAL_COMMUNITY): Payer: Medicare PPO | Admitting: Certified Registered Nurse Anesthetist

## 2014-10-17 ENCOUNTER — Encounter (HOSPITAL_COMMUNITY)
Admission: RE | Disposition: A | Discharge status: Home / Self Care | Payer: Self-pay | Source: Ambulatory Visit | Attending: Orthopaedic Surgery

## 2014-10-17 ENCOUNTER — Ambulatory Visit (HOSPITAL_COMMUNITY)
Admission: RE | Admit: 2014-10-17 | Discharge: 2014-10-17 | Disposition: A | Discharge status: Home / Self Care | Payer: Medicare PPO | Source: Ambulatory Visit | Attending: Orthopaedic Surgery | Admitting: Orthopaedic Surgery

## 2014-10-17 ENCOUNTER — Encounter (HOSPITAL_COMMUNITY): Payer: Self-pay | Admitting: *Deleted

## 2014-10-17 DIAGNOSIS — M24662 Ankylosis, left knee: Secondary | ICD-10-CM | POA: Diagnosis not present

## 2014-10-17 DIAGNOSIS — T8482XA Fibrosis due to internal orthopedic prosthetic devices, implants and grafts, initial encounter: Secondary | ICD-10-CM

## 2014-10-17 DIAGNOSIS — E119 Type 2 diabetes mellitus without complications: Secondary | ICD-10-CM | POA: Insufficient documentation

## 2014-10-17 DIAGNOSIS — E780 Pure hypercholesterolemia: Secondary | ICD-10-CM | POA: Insufficient documentation

## 2014-10-17 DIAGNOSIS — I10 Essential (primary) hypertension: Secondary | ICD-10-CM | POA: Diagnosis not present

## 2014-10-17 DIAGNOSIS — Z96652 Presence of left artificial knee joint: Secondary | ICD-10-CM | POA: Insufficient documentation

## 2014-10-17 HISTORY — PX: KNEE CLOSED REDUCTION: SHX995

## 2014-10-17 LAB — GLUCOSE, CAPILLARY
GLUCOSE-CAPILLARY: 133 mg/dL — AB (ref 70–99)
Glucose-Capillary: 122 mg/dL — ABNORMAL HIGH (ref 70–99)

## 2014-10-17 LAB — BASIC METABOLIC PANEL
Anion gap: 8 (ref 5–15)
BUN: 14 mg/dL (ref 6–23)
CHLORIDE: 100 mmol/L (ref 96–112)
CO2: 28 mmol/L (ref 19–32)
Calcium: 9.5 mg/dL (ref 8.4–10.5)
Creatinine, Ser: 0.7 mg/dL (ref 0.50–1.35)
GFR calc Af Amer: >90 mL/min (ref >90)
GFR calc non Af Amer: >90 mL/min (ref >90)
Glucose, Bld: 138 mg/dL — ABNORMAL HIGH (ref 70–99)
POTASSIUM: 3.5 mmol/L (ref 3.5–5.1)
Sodium: 136 mmol/L (ref 135–145)

## 2014-10-17 SURGERY — MANIPULATION, KNEE, CLOSED
Anesthesia: General | Site: Knee | Laterality: Left

## 2014-10-17 MED ORDER — PROPOFOL 10 MG/ML IV BOLUS
INTRAVENOUS | Status: DC | PRN
  Administered 2014-10-17: 200 mg via INTRAVENOUS

## 2014-10-17 MED ORDER — KETOROLAC TROMETHAMINE 30 MG/ML IJ SOLN
30.0000 mg | Freq: Once | INTRAMUSCULAR | Status: AC | PRN
  Administered 2014-10-17: 30 mg via INTRAVENOUS

## 2014-10-17 MED ORDER — ONDANSETRON HCL 4 MG/2ML IJ SOLN
INTRAMUSCULAR | Status: DC | PRN
  Administered 2014-10-17: 4 mg via INTRAVENOUS

## 2014-10-17 MED ORDER — OXYCODONE HCL 5 MG/5ML PO SOLN
5.0000 mg | Freq: Once | ORAL | Status: DC | PRN
  Filled 2014-10-17: qty 5

## 2014-10-17 MED ORDER — FENTANYL CITRATE 0.05 MG/ML IJ SOLN
INTRAMUSCULAR | Status: AC
  Filled 2014-10-17: qty 2

## 2014-10-17 MED ORDER — CEFAZOLIN SODIUM-DEXTROSE 2-3 GM-% IV SOLR: 2.0000 g | INTRAVENOUS | Status: DC

## 2014-10-17 MED ORDER — ONDANSETRON HCL 4 MG/2ML IJ SOLN
INTRAMUSCULAR | Status: AC
  Filled 2014-10-17: qty 2

## 2014-10-17 MED ORDER — LACTATED RINGERS IV SOLN
INTRAVENOUS | Status: DC
  Administered 2014-10-17: 1000 mL via INTRAVENOUS

## 2014-10-17 MED ORDER — KETOROLAC TROMETHAMINE 30 MG/ML IJ SOLN
INTRAMUSCULAR | Status: AC
  Filled 2014-10-17: qty 1

## 2014-10-17 MED ORDER — MIDAZOLAM HCL 2 MG/2ML IJ SOLN
INTRAMUSCULAR | Status: AC
  Filled 2014-10-17: qty 2

## 2014-10-17 MED ORDER — LIDOCAINE HCL (CARDIAC) 20 MG/ML IV SOLN
INTRAVENOUS | Status: DC | PRN
  Administered 2014-10-17: 100 mg via INTRAVENOUS

## 2014-10-17 MED ORDER — FENTANYL CITRATE 0.05 MG/ML IJ SOLN
INTRAMUSCULAR | Status: DC | PRN
  Administered 2014-10-17 (×2): 50 ug via INTRAVENOUS

## 2014-10-17 MED ORDER — PROPOFOL 10 MG/ML IV BOLUS
INTRAVENOUS | Status: AC
  Filled 2014-10-17: qty 20

## 2014-10-17 MED ORDER — BUPIVACAINE HCL (PF) 0.5 % IJ SOLN
INTRAMUSCULAR | Status: DC | PRN
  Administered 2014-10-17: 4 mL

## 2014-10-17 MED ORDER — MIDAZOLAM HCL 5 MG/5ML IJ SOLN
INTRAMUSCULAR | Status: DC | PRN
  Administered 2014-10-17: 1 mg via INTRAVENOUS

## 2014-10-17 MED ORDER — BUPIVACAINE HCL (PF) 0.5 % IJ SOLN
INTRAMUSCULAR | Status: AC
  Filled 2014-10-17: qty 30

## 2014-10-17 MED ORDER — LIDOCAINE HCL (CARDIAC) 20 MG/ML IV SOLN
INTRAVENOUS | Status: AC
  Filled 2014-10-17: qty 5

## 2014-10-17 MED ORDER — MEPERIDINE HCL 50 MG/ML IJ SOLN: 6.2500 mg | INTRAMUSCULAR | Status: DC | PRN

## 2014-10-17 MED ORDER — METHYLPREDNISOLONE ACETATE 40 MG/ML IJ SUSP
INTRAMUSCULAR | Status: AC
  Filled 2014-10-17: qty 1

## 2014-10-17 MED ORDER — HYDROMORPHONE HCL 1 MG/ML IJ SOLN: 0.2500 mg | INTRAMUSCULAR | Status: DC | PRN

## 2014-10-17 MED ORDER — METHYLPREDNISOLONE ACETATE 40 MG/ML IJ SUSP
INTRAMUSCULAR | Status: DC | PRN
  Administered 2014-10-17: 40 mg

## 2014-10-17 MED ORDER — PROMETHAZINE HCL 25 MG/ML IJ SOLN: 6.2500 mg | INTRAMUSCULAR | Status: DC | PRN

## 2014-10-17 MED ORDER — OXYCODONE HCL 5 MG PO TABS: 5.0000 mg | ORAL_TABLET | Freq: Once | ORAL | Status: DC | PRN

## 2014-10-17 SURGICAL SUPPLY — 11 items
BANDAGE ADH SHEER 1  50/CT (GAUZE/BANDAGES/DRESSINGS) IMPLANT
GAUZE SPONGE 4X4 12PLY STRL (GAUZE/BANDAGES/DRESSINGS) IMPLANT
GLOVE BIOGEL PI IND STRL 8 (GLOVE) ×1 IMPLANT
GLOVE BIOGEL PI INDICATOR 8 (GLOVE) ×2
GLOVE ECLIPSE 8.0 STRL XLNG CF (GLOVE) ×3 IMPLANT
GOWN STRL REUS W/TWL XL LVL3 (GOWN DISPOSABLE) ×3 IMPLANT
NDL SAFETY ECLIPSE 18X1.5 (NEEDLE) IMPLANT
NEEDLE HYPO 18GX1.5 SHARP (NEEDLE)
POSITIONER SURGICAL ARM (MISCELLANEOUS) ×3 IMPLANT
SYR CONTROL 10ML LL (SYRINGE) IMPLANT
TOWEL OR 17X26 10 PK STRL BLUE (TOWEL DISPOSABLE) ×6 IMPLANT

## 2014-10-17 NOTE — Anesthesia Preprocedure Evaluation (Addendum)
Anesthesia Evaluation  Patient identified by MRN, date of birth, ID band Patient awake    Reviewed: Allergy & Precautions, NPO status , Patient's Chart, lab work & pertinent test results  Airway Mallampati: II  TM Distance: >3 FB Neck ROM: Full    Dental no notable dental hx.    Pulmonary neg pulmonary ROS,  breath sounds clear to auscultation  Pulmonary exam normal       Cardiovascular hypertension, Pt. on medications Rhythm:Regular Rate:Normal     Neuro/Psych negative neurological ROS  negative psych ROS   GI/Hepatic negative GI ROS, Neg liver ROS,   Endo/Other  diabetes, Type 2, Oral Hypoglycemic Agents, Insulin Dependent  Renal/GU Renal disease     Musculoskeletal  (+) Arthritis -,   Abdominal   Peds  Hematology negative hematology ROS (+)   Anesthesia Other Findings   Reproductive/Obstetrics negative OB ROS                                                            Anesthesia Evaluation  Patient identified by MRN, date of birth, ID band Patient awake    Reviewed: Allergy & Precautions, H&P , Patient's Chart, lab work & pertinent test results  Airway Mallampati: II  TM Distance: >3 FB Neck ROM: full    Dental no notable dental hx.    Pulmonary  breath sounds clear to auscultation  Pulmonary exam normal       Cardiovascular Exercise Tolerance: Good hypertension, On Medications Rhythm:regular Rate:Normal     Neuro/Psych    GI/Hepatic   Endo/Other  diabetes, Type 2, Insulin Dependent  Renal/GU      Musculoskeletal   Abdominal   Peds  Hematology   Anesthesia Other Findings   Reproductive/Obstetrics                           Anesthesia Physical Anesthesia Plan  ASA: II  Anesthesia Plan: Spinal   Post-op Pain Management:    Induction:   Airway Management Planned:   Additional Equipment:   Intra-op Plan:    Post-operative Plan:   Informed Consent: I have reviewed the patients History and Physical, chart, labs and discussed the procedure including the risks, benefits and alternatives for the proposed anesthesia with the patient or authorized representative who has indicated his/her understanding and acceptance.   Dental Advisory Given  Plan Discussed with: CRNA  Anesthesia Plan Comments: (Lab work confirmed with CRNA in room. Platelets okay. Discussed spinal anesthetic, and patient consents to the procedure:  included risk of possible headache,backache, failed block, allergic reaction, and nerve injury. This patient was asked if she had any questions or concerns before the procedure started. )        Anesthesia Quick Evaluation  Anesthesia Physical Anesthesia Plan  ASA: II  Anesthesia Plan: General   Post-op Pain Management:    Induction: Intravenous  Airway Management Planned: Mask  Additional Equipment:   Intra-op Plan:   Post-operative Plan: Extubation in OR  Informed Consent: I have reviewed the patients History and Physical, chart, labs and discussed the procedure including the risks, benefits and alternatives for the proposed anesthesia with the patient or authorized representative who has indicated his/her understanding and acceptance.   Dental advisory given  Plan Discussed with: CRNA  Anesthesia Plan Comments:         Anesthesia Quick Evaluation                                  Anesthesia Evaluation  Patient identified by MRN, date of birth, ID band Patient awake    Reviewed: Allergy & Precautions, H&P , Patient's Chart, lab work & pertinent test results  Airway Mallampati: II  TM Distance: >3 FB Neck ROM: full    Dental no notable dental hx.    Pulmonary  breath sounds clear to auscultation  Pulmonary exam normal       Cardiovascular Exercise Tolerance: Good hypertension, On Medications Rhythm:regular Rate:Normal      Neuro/Psych    GI/Hepatic   Endo/Other  diabetes, Type 2, Insulin Dependent  Renal/GU      Musculoskeletal   Abdominal   Peds  Hematology   Anesthesia Other Findings   Reproductive/Obstetrics                           Anesthesia Physical Anesthesia Plan  ASA: II  Anesthesia Plan: Spinal   Post-op Pain Management:    Induction:   Airway Management Planned:   Additional Equipment:   Intra-op Plan:   Post-operative Plan:   Informed Consent: I have reviewed the patients History and Physical, chart, labs and discussed the procedure including the risks, benefits and alternatives for the proposed anesthesia with the patient or authorized representative who has indicated his/her understanding and acceptance.   Dental Advisory Given  Plan Discussed with: CRNA  Anesthesia Plan Comments: (Lab work confirmed with CRNA in room. Platelets okay. Discussed spinal anesthetic, and patient consents to the procedure:  included risk of possible headache,backache, failed block, allergic reaction, and nerve injury. This patient was asked if she had any questions or concerns before the procedure started. )        Anesthesia Quick Evaluation

## 2014-10-17 NOTE — H&P (Signed)
Christopher MightyJohn B Rasmussen is an 69 y.o. male.   Chief Complaint:   Stiff left knee with decreased range-of-motion post total knee HPI:   69 yo male who is about 7 weeks now post a left total knee replacement.  In spite of efforts to get his knee moving thru therapy, he has developed post-operative arthrofibrosis.  It is recommended that he undergo a manipulation under anesthesia of his left knee followed by aggressive therapy to increase his knee motion.  He understands fully this recommendation as well as the risks and benefits involved.  Past Medical History  Diagnosis Date  . Hypertension   . High cholesterol   . Type II diabetes mellitus   . Arthritis     Past Surgical History  Procedure Laterality Date  . Knee arthroscopy Left 1990's  . Total knee arthroplasty Left 08/22/2014    Procedure: LEFT TOTAL KNEE ARTHROPLASTY;  Surgeon: Kathryne Hitchhristopher Y Toniette Devera, MD;  Location: [facility];  Service: Orthopedics;  Laterality: Left;    Family History  Problem Relation Age of Onset  . CAD Father   . Stroke Mother    Social History:  reports that he has never smoked. He has never used smokeless tobacco. He reports that he does not drink alcohol or use illicit drugs.  Allergies: No Known Allergies  No prescriptions prior to admission    Results for orders placed or performed during the hospital encounter of 10/15/14 (from the past 48 hour(s))  CBC     Status: None   Collection Time: 10/15/14 10:30 AM  Result Value Ref Range   WBC 6.8 4.0 - 10.5 K/uL   RBC 4.92 4.22 - 5.81 MIL/uL   Hemoglobin 13.9 13.0 - 17.0 g/dL   HCT 08.640.9 57.839.0 - 46.952.0 %   MCV 83.1 78.0 - 100.0 fL   MCH 28.3 26.0 - 34.0 pg   MCHC 34.0 30.0 - 36.0 g/dL   RDW 62.913.8 52.811.5 - 41.315.5 %   Platelets 294 150 - 400 K/uL   No results found.  Review of Systems  All other systems reviewed and are negative.   There were no vitals taken for this visit. Physical Exam  Constitutional: He is oriented to person, place, and time. He appears  well-developed and well-nourished.  HENT:  Head: Normocephalic and atraumatic.  Eyes: EOM are normal. Pupils are equal, round, and reactive to light.  Neck: Normal range of motion. Neck supple.  Cardiovascular: Normal rate and regular rhythm.   Respiratory: Effort normal and breath sounds normal.  GI: Soft. Bowel sounds are normal.  Musculoskeletal:       Left knee: He exhibits decreased range of motion.  Neurological: He is alert and oriented to person, place, and time.  Skin: Skin is warm and dry.  Psychiatric: He has a normal mood and affect.     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  Inga Noller Y 10/17/2014, 7:25 AM

## 2014-10-17 NOTE — Discharge Instructions (Signed)
Ice and elevation as needed. Work on aggressive knee motion.

## 2014-10-17 NOTE — Brief Op Note (Signed)
10/17/2014  3:50 PM  PATIENT:  Christopher Rasmussen  69 y.o. 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   Delay start of Pharmacological VTE agent (>24hrs) due to surgical blood loss or risk of bleeding: not applicable

## 2014-10-17 NOTE — Anesthesia Postprocedure Evaluation (Signed)
Anesthesia Post Note  Patient: Christopher MightyJohn B Rasmussen  Procedure(s) Performed: Procedure(s) (LRB): CLOSED MANIPULATION LEFT KNEE UNDER ANESTHESIA (Left)  Anesthesia type: General  Patient location: PACU  Post pain: Pain level controlled  Post assessment: Post-op Vital signs reviewed  Last Vitals: BP 139/66 mmHg  Pulse 89  Temp(Src) 36.8 C (Oral)  Resp 15  Ht 5\' 9"  (1.753 m)  Wt 221 lb (100.245 kg)  BMI 32.62 kg/m2  SpO2 94%  Post vital signs: Reviewed  Level of consciousness: sedated  Complications: No apparent anesthesia complications

## 2014-10-17 NOTE — Transfer of Care (Signed)
Immediate Anesthesia Transfer of Care Note  Patient: Christopher Rasmussen  Procedure(s) Performed: Procedure(s) (LRB): CLOSED MANIPULATION LEFT KNEE UNDER ANESTHESIA (Left)  Patient Location: PACU  Anesthesia Type: General  Level of Consciousness: sedated, patient cooperative and responds to stimulation  Airway & Oxygen Therapy: Patient Spontanous Breathing and Patient connected to face mask oxgen  Post-op Assessment: Report given to PACU RN and Post -op Vital signs reviewed and stable  Post vital signs: Reviewed and stable  Complications: No apparent anesthesia complications

## 2014-10-18 NOTE — Op Note (Signed)
NAME:  [name], [name]                 ACCOUNT NO.:  [id number]  MEDICAL RECORD NO.:  [id number]  LOCATION:  [facility]                         [facility]  PHYSICIAN:  Vanita PandaChristopher Y. Magnus IvanBlackman, M.D.DATE OF BIRTH:  Mar 10, 1946  DATE OF PROCEDURE:  10/17/2014 DATE OF DISCHARGE:  10/17/2014                              OPERATIVE REPORT   PREOPERATIVE DIAGNOSIS:  Postoperative arthrofibrosis, left knee, status post total knee arthroplasty.  POSTOPERATIVE DIAGNOSIS:  Postoperative arthrofibrosis, left knee, status post total knee arthroplasty.  PROCEDURE:  Manipulation under anesthesia of left knee.  SURGEON:  Vanita PandaChristopher Y. Magnus IvanBlackman, M.D.  ANESTHESIA: 1. Mask ventilation, IV sedation. 2. Local with of 4 mL of 0.5% plain Marcaine mixed with 1 mL of Depo-     Medrol.  COMPLICATIONS:  None.  BLOOD LOSS:  Not applicable.  INDICATIONS:  [name] is a 69 year old gentleman who is almost 7 weeks status post a left total knee arthroplasty.  His postoperative course has been affected by arthrofibrosis of the knee with the inability to flex his knee to even close to 90 degrees.  He is 6-7 weeks out and now at this point, he needs manipulation under anesthesia and he understands this as well.  PROCEDURE DESCRIPTION:  After informed consent was obtained, appropriate left knee was marked.  He was brought to the operating room and placed supine on the operating table.  Mask ventilation IV sedation was obtained.  Time-out was called and he identified as correct patient and correct left knee.  I then made an effort to manipulate his knee and was able to slowly flex and extend and getting him bend to about 110-115 degrees with flexion and almost full extension.  At this point, I put him through several cycles of this and then I cleaned the superolateral aspect of his knee with Betadine and alcohol and followed by injection of 4 mL of 0.5% plain Marcaine, mixed with 1 mL of Depo-Medrol, which  shot well, but Betadine was placed.  He was taken to the recovery room in stable condition.  Postoperatively, he will be discharged to home with working on aggressive motion of his knee and continue physical therapy as well.     Vanita Pandahristopher Y. Magnus IvanBlackman, M.D.     CYB/MEDQ  D:  10/17/2014  T:  10/18/2014  Job:  [id number]

## 2014-10-20 ENCOUNTER — Encounter (HOSPITAL_COMMUNITY): Payer: Self-pay | Admitting: Orthopaedic Surgery

## 2014-10-20 ENCOUNTER — Ambulatory Visit: Payer: Medicare PPO | Admitting: Physical Therapy

## 2014-10-20 DIAGNOSIS — M25662 Stiffness of left knee, not elsewhere classified: Secondary | ICD-10-CM

## 2014-10-20 DIAGNOSIS — Z96652 Presence of left artificial knee joint: Secondary | ICD-10-CM | POA: Diagnosis not present

## 2014-10-20 DIAGNOSIS — R29898 Other symptoms and signs involving the musculoskeletal system: Secondary | ICD-10-CM

## 2014-10-20 DIAGNOSIS — M25562 Pain in left knee: Secondary | ICD-10-CM

## 2014-10-20 DIAGNOSIS — R609 Edema, unspecified: Secondary | ICD-10-CM

## 2014-10-20 NOTE — Patient Instructions (Signed)
Instructed patient to use a sheet wrapped around arch of foot to increase knee flex and ext, cont with Quad sets and knee AAROM

## 2014-10-20 NOTE — Therapy (Signed)
[facility] [address] [place], [place], [address] Phone: [phone]   Fax:  [phone]  Physical Therapy Treatment/Renewal  Patient Details  Name: Christopher Rasmussen MRN: [id number] Date of Birth: Dec 25, 1945 Referring Provider:  Aida PufferLittle, James, MD  Encounter Date: 10/20/2014      PT End of Session - 10/20/14 1446    Visit Number 12   Number of Visits 12   PT Start Time 1415   PT Stop Time 1500   PT Time Calculation (min) 45 min      Past Medical History  Diagnosis Date  . Hypertension   . High cholesterol   . Type II diabetes mellitus   . Arthritis     Past Surgical History  Procedure Laterality Date  . Knee arthroscopy Left 1990's  . Total knee arthroplasty Left 08/22/2014    Procedure: LEFT TOTAL KNEE ARTHROPLASTY;  Surgeon: Kathryne Hitchhristopher Y Blackman, MD;  Location: [facility];  Service: Orthopedics;  Laterality: Left;  . Knee closed reduction Left 10/17/2014    Procedure: CLOSED MANIPULATION LEFT KNEE UNDER ANESTHESIA;  Surgeon: Kathryne Hitchhristopher Y Blackman, MD;  Location[facility];  Service: Orthopedics;  Laterality: Left;    There were no vitals filed for this visit.  Visit Diagnosis:  Knee stiffness, left  Weakness of left lower extremity  Edema  Left knee pain  Status post total left knee replacement      Subjective Assessment - 10/20/14 1428    Symptoms Sore today medially from manipulation Friday. Not rated, mild with activity.    How long can you stand comfortably? 5-10 min   How long can you walk comfortably? 10 min    Patient Stated Goals to improve L knee function; improve mobility            [facility] PT Assessment - 10/20/14 1429    AROM   Left Knee Extension -15  -11 with AAROM   Left Knee Flexion 85  sitting   PROM   Left Knee Flexion 94           [facility] Adult PT Treatment/Exercise - 10/20/14 1456    Cryotherapy   Number Minutes Cryotherapy 15 Minutes   Cryotherapy Location Knee   Type of  Cryotherapy Other (comment)  vaso   Manual Therapy   Joint Mobilization patella mobs all planes and grade 3  extension mobs sitting  scar tissue massage    Passive ROM Gr II mobs flex and ext.       AAROM knee flex, ext self stretch in supine NuStep 10 min level 8, UE and LE          PT Education - 10/20/14 1445    Education provided Yes   Education Details AAROM with sheet   Person(s) Educated Patient   Methods Explanation;Demonstration   Comprehension Verbalized understanding;Returned demonstration          PT Short Term Goals - 10/13/14 1210    PT SHORT TERM GOAL #1   Title independent with HEP (10/14/14)   Time 4   Period Weeks   Status Achieved   PT SHORT TERM GOAL #2   Title improve L knee AROM 5-75 for improved function (10/14/14)   Time 4   Period Weeks   Status On-going   PT SHORT TERM GOAL #3   Title improve gait velocity to > 1.0 ft/sec for improved mobility (10/14/14)   Time 4   Period Weeks   Status Achieved  1.7 ft/sec with RW  PT SHORT TERM GOAL #4   Title ambulate > 250' with LRAD modified independent for improved mobility (10/14/14)   Time 4   Period Weeks   Status Achieved           PT Long Term Goals - 10/10/14 1145    PT LONG TERM GOAL #1   Title independent with advanced HEP (11/11/14)   Status On-going   PT LONG TERM GOAL #2   Title improve L knee AROM 0-95 for improved motion and mobility (11/11/14)   Status On-going   PT LONG TERM GOAL #3   Title improve gait velocity to > 1.5 ft/sec for improved mobility (11/11/14)   Status On-going   PT LONG TERM GOAL #4   Title demonstrate at least 4/5 L hip strength for improved gait and mobility (11/11/14)   Status On-going               Plan - November 08, 2014 1447    Clinical Impression Statement Patient presents following manipulation with A/AROM to 85 deg, PROM to 95 deg. He will benefit from 12 more visits to improve and maintain ROM in L knee, mobility.     Pt will benefit from skilled  therapeutic intervention in order to improve on the following deficits Abnormal gait;Decreased knowledge of use of DME;Pain;Impaired flexibility;Decreased mobility;Decreased range of motion;Decreased strength;Difficulty walking;Decreased balance;Decreased activity tolerance;Decreased endurance   Rehab Potential Good   PT Frequency 3x / week   PT Duration 4 weeks   PT Treatment/Interventions ADLs/Self Care Home Management;Cryotherapy;Ultrasound;DME Instruction;Therapeutic activities;Passive range of motion;Manual techniques;Neuromuscular re-education;Functional mobility training;Moist Heat;Biofeedback;Stair training;Balance training;Scar mobilization;Patient/family education;Therapeutic exercise;Gait training;Electrical Stimulation          G-Codes - 11/08/2014 1501    Functional Assessment Tool Used clinical judgement   Functional Limitation Mobility: Walking and moving around   Mobility: Walking and Moving Around Current Status [phone]) At least 60 percent but less than 80 percent impaired, limited or restricted   Mobility: Walking and Moving Around Goal Status [phone]) At least 20 percent but less than 40 percent impaired, limited or restricted      Problem List Patient Active Problem List   Diagnosis Date Noted  . Arthrofibrosis of total knee arthroplasty, left 10/17/2014  . Ileus, postoperative   . Acute renal failure with tubular necrosis   . Acute renal failure 08/23/2014  . Abdominal distension 08/23/2014  . Hyperkalemia 08/23/2014  . Arthritis of right knee 08/22/2014  . Status post total left knee replacement 08/22/2014  . Chest pain 02/22/2013  . HTN (hypertension) 02/22/2013  . HLD (hyperlipidemia) 02/22/2013  . Diabetes 02/22/2013    Temeca Somma 2014-11-08, 3:08 PM  [facility] [address] [place], [place], [address] Phone: [phone]   Fax:  [phone]

## 2014-10-21 ENCOUNTER — Ambulatory Visit: Payer: Medicare PPO | Admitting: Physical Therapy

## 2014-10-21 ENCOUNTER — Ambulatory Visit: Payer: Medicare PPO

## 2014-10-21 DIAGNOSIS — M25662 Stiffness of left knee, not elsewhere classified: Secondary | ICD-10-CM

## 2014-10-21 DIAGNOSIS — M25562 Pain in left knee: Secondary | ICD-10-CM

## 2014-10-21 DIAGNOSIS — R29898 Other symptoms and signs involving the musculoskeletal system: Secondary | ICD-10-CM

## 2014-10-21 DIAGNOSIS — Z96652 Presence of left artificial knee joint: Secondary | ICD-10-CM | POA: Diagnosis not present

## 2014-10-21 DIAGNOSIS — R609 Edema, unspecified: Secondary | ICD-10-CM

## 2014-10-21 NOTE — Therapy (Signed)
[facility] [address] [place], [place], [address] Phone: [phone]   Fax:  [phone]  Physical Therapy Treatment  Patient Details  Name: Christopher Rasmussen MRN: [id number] Date of Birth: January 29, 1946 Referring Provider:  Aida PufferLittle, James, MD  Encounter Date: 10/21/2014      PT End of Session - 10/21/14 1440    Visit Number 13   Number of Visits 18   Date for PT Re-Evaluation 11/28/14   Authorization - Visit Number --  6 more approved 10/20/14   Activity Tolerance Patient tolerated treatment well      Past Medical History  Diagnosis Date  . Hypertension   . High cholesterol   . Type II diabetes mellitus   . Arthritis     Past Surgical History  Procedure Laterality Date  . Knee arthroscopy Left 1990's  . Total knee arthroplasty Left 08/22/2014    Procedure: LEFT TOTAL KNEE ARTHROPLASTY;  Surgeon: Kathryne Hitchhristopher Y Blackman, MD;  Location: [facility];  Service: Orthopedics;  Laterality: Left;  . Knee closed reduction Left 10/17/2014    Procedure: CLOSED MANIPULATION LEFT KNEE UNDER ANESTHESIA;  Surgeon: Kathryne Hitchhristopher Y Blackman, MD;  Location[facility];  Service: Orthopedics;  Laterality: Left;    There were no vitals filed for this visit.  Visit Diagnosis:  Knee stiffness, left  Weakness of left lower extremity  Edema  Left knee pain      Subjective Assessment - 10/21/14 1417    Symptoms C/O pain today, 1/10 with rate, see below.             [facility] PT Assessment - 10/20/14 1429    AROM   Left Knee Extension -15  -11 with AAROM   Left Knee Flexion 85  sitting   PROM   Left Knee Flexion 94                   [facility] Adult PT Treatment/Exercise - 10/21/14 1418    Knee/Hip Exercises: Aerobic   Stationary Bike NuStep L 4 LE and UE   Knee/Hip Exercises: Machines for Strengthening   Cybex Leg Press 1 plate   Knee/Hip Exercises: Standing   Other Standing Knee Exercises calf stretch against wall x 30 sec  bilat. x 2    Knee/Hip Exercises: Prone   Hamstring Curl 1 set;10 reps   Hip Extension Strengthening;Left;2 sets;10 reps   Contract/Relax to Increase Flexion with PT and with strap (self)   Other Prone Exercises prone quad set   Other Prone Exercises to 90 deg.    Cryotherapy   Number Minutes Cryotherapy 15 Minutes   Cryotherapy Location Knee   Type of Cryotherapy Other (comment)  Vaso   Manual Therapy   Joint Mobilization patella mobs all planes and grade 3  extension mobs sitting  scar tissue massage                 PT Education - 10/20/14 1445    Education provided Yes   Education Details AAROM with sheet   Person(s) Educated Patient   Methods Explanation;Demonstration   Comprehension Verbalized understanding;Returned demonstration          PT Short Term Goals - 10/21/14 1455    PT SHORT TERM GOAL #1   Title independent with HEP (10/14/14)   Status Achieved   PT SHORT TERM GOAL #2   Title improve L knee AROM 5-75 for improved function (10/14/14)   Status Achieved   PT SHORT TERM GOAL #3  Title improve gait velocity to > 1.0 ft/sec for improved mobility (10/14/14)   Status Achieved   PT SHORT TERM GOAL #4   Title ambulate > 250' with LRAD modified independent for improved mobility (10/14/14)   Status Achieved           PT Long Term Goals - 10/21/14 1455    PT LONG TERM GOAL #1   Title independent with advanced HEP (11/11/14)   Status On-going   PT LONG TERM GOAL #2   Title improve L knee AROM 0-95 for improved motion and mobility (11/11/14)   Status On-going   PT LONG TERM GOAL #3   Title improve gait velocity to > 1.5 ft/sec for improved mobility (11/11/14)   Status On-going   PT LONG TERM GOAL #4   Title demonstrate at least 4/5 L hip strength for improved gait and mobility (11/11/14)   Status On-going               Plan - 10/21/14 1503    Clinical Impression Statement [employer] visits were approved, tolerated prone stretches well, able to self  stretch to 90 deg.    PT Frequency 3x / week   PT Duration 4 weeks  may need to request more visits in 1-2 weeks   PT Next Visit Plan try tape for edema, cont with ROM, strength   PT Home Exercise Plan Cont same HEP   Consulted and Agree with Plan of Care Patient          G-Codes - 11-07-2014 1501    Functional Assessment Tool Used clinical judgement   Functional Limitation Mobility: Walking and moving around   Mobility: Walking and Moving Around Current Status [phone]) At least 60 percent but less than 80 percent impaired, limited or restricted   Mobility: Walking and Moving Around Goal Status [phone]) At least 20 percent but less than 40 percent impaired, limited or restricted      Problem List Patient Active Problem List   Diagnosis Date Noted  . Arthrofibrosis of total knee arthroplasty, left 10/17/2014  . Ileus, postoperative   . Acute renal failure with tubular necrosis   . Acute renal failure 08/23/2014  . Abdominal distension 08/23/2014  . Hyperkalemia 08/23/2014  . Arthritis of right knee 08/22/2014  . Status post total left knee replacement 08/22/2014  . Chest pain 02/22/2013  . HTN (hypertension) 02/22/2013  . HLD (hyperlipidemia) 02/22/2013  . Diabetes 02/22/2013    PAA,JENNIFER 10/21/2014, 3:05 PM  [facility] [address] [place], [place], [address] Phone: [phone]   Fax:  [phone]

## 2014-10-22 ENCOUNTER — Encounter: Payer: Medicare PPO | Admitting: Rehabilitation

## 2014-10-24 ENCOUNTER — Ambulatory Visit: Payer: Medicare PPO

## 2014-10-24 DIAGNOSIS — Z96652 Presence of left artificial knee joint: Secondary | ICD-10-CM | POA: Diagnosis not present

## 2014-10-24 DIAGNOSIS — M25662 Stiffness of left knee, not elsewhere classified: Secondary | ICD-10-CM

## 2014-10-24 NOTE — Therapy (Signed)
[facility] [address] [place], [place], [address] Phone: [phone]   Fax:  [phone]  Physical Therapy Treatment  Patient Details  Name: Christopher Rasmussen MRN: [id number] Date of Birth: Jun 13, 1946 Referring Provider:  Aida Puffer, MD  Encounter Date: 10/24/2014      PT End of Session - 10/24/14 1208    Visit Number 14   Number of Visits 18   Date for PT Re-Evaluation 11/28/14   Authorization Time Period end 11/28/14   PT Start Time 1100   PT Stop Time 1150   PT Time Calculation (min) 50 min   Activity Tolerance Patient tolerated treatment well   Behavior During Therapy [facility] for tasks assessed/performed      Past Medical History  Diagnosis Date  . Hypertension   . High cholesterol   . Type II diabetes mellitus   . Arthritis     Past Surgical History  Procedure Laterality Date  . Knee arthroscopy Left 1990's  . Total knee arthroplasty Left 08/22/2014    Procedure: LEFT TOTAL KNEE ARTHROPLASTY;  Surgeon: Kathryne Hitch, MD;  Location: [facility];  Service: Orthopedics;  Laterality: Left;  . Knee closed reduction Left 10/17/2014    Procedure: CLOSED MANIPULATION LEFT KNEE UNDER ANESTHESIA;  Surgeon: Kathryne Hitch, MD;  [facility];  Service: Orthopedics;  Laterality: Left;    There were no vitals filed for this visit.  Visit Diagnosis:  Knee stiffness, left      Subjective Assessment - 10/24/14 1202    Symptoms It was 90 degrees last. mild pain at rest at medical LT knee   Currently in Pain? Yes   Pain Score 2    Pain Type Acute pain   Pain Onset In the past 7 days   Pain Frequency Constant   Multiple Pain Sites No                       [facility] Adult PT Treatment/Exercise - 10/24/14 1203    Knee/Hip Exercises: Aerobic   Stationary Bike NuStep L 4 LE and UE prep to stretching   Manual Therapy   Joint Mobilization patella mobs all planes and grade 4  extension but more  flexion mobs mobs supine   Passive ROM Emphasis on flexion range with contract relax and basic passive motion  passive flexion to 100 degrees     Contract relax for flexion range, STW to quads           PT Education - 10/24/14 1207    Education provided Yes   Education Details ice and mobilization at home   Person(s) Educated Patient   Methods Explanation   Comprehension Verbalized understanding          PT Short Term Goals - 10/21/14 1455    PT SHORT TERM GOAL #1   Title independent with HEP (10/14/14)   Status Achieved   PT SHORT TERM GOAL #2   Title improve L knee AROM 5-75 for improved function (10/14/14)   Status Achieved   PT SHORT TERM GOAL #3   Title improve gait velocity to > 1.0 ft/sec for improved mobility (10/14/14)   Status Achieved   PT SHORT TERM GOAL #4   Title ambulate > 250' with LRAD modified independent for improved mobility (10/14/14)   Status Achieved           PT Long Term Goals - 10/21/14 1455    PT LONG TERM GOAL #1  Title independent with advanced HEP (11/11/14)   Status On-going   PT LONG TERM GOAL #2   Title improve L knee AROM 0-95 for improved motion and mobility (11/11/14)   Status On-going   PT LONG TERM GOAL #3   Title improve gait velocity to > 1.5 ft/sec for improved mobility (11/11/14)   Status On-going   PT LONG TERM GOAL #4   Title demonstrate at least 4/5 L hip strength for improved gait and mobility (11/11/14)   Status On-going               Plan - 10/24/14 1209    Clinical Impression Statement Range improved with flexion post session. Knee swollen contributing to stiffness. tolerated aggressive range without complaint   PT Next Visit Plan Continue agressive range, possible tape   Consulted and Agree with Plan of Care Patient        Problem List Patient Active Problem List   Diagnosis Date Noted  . Arthrofibrosis of total knee arthroplasty, left 10/17/2014  . Ileus, postoperative   . Acute renal failure with  tubular necrosis   . Acute renal failure 08/23/2014  . Abdominal distension 08/23/2014  . Hyperkalemia 08/23/2014  . Arthritis of right knee 08/22/2014  . Status post total left knee replacement 08/22/2014  . Chest pain 02/22/2013  . HTN (hypertension) 02/22/2013  . HLD (hyperlipidemia) 02/22/2013  . Diabetes 02/22/2013    Caprice RedChasse, Emaleigh Guimond M  PT  10/24/2014, 12:11 PM  [facility] [address] [place], [place], [address] Phone: [phone]   Fax:  [phone]

## 2014-10-24 NOTE — Patient Instructions (Signed)
Asked him to ice and elevate a lot at home and to walk prior to stretching at home with spouse

## 2014-11-03 ENCOUNTER — Ambulatory Visit: Payer: Medicare PPO

## 2014-11-03 DIAGNOSIS — M25662 Stiffness of left knee, not elsewhere classified: Secondary | ICD-10-CM

## 2014-11-03 DIAGNOSIS — Z96652 Presence of left artificial knee joint: Secondary | ICD-10-CM | POA: Diagnosis not present

## 2014-11-03 NOTE — Therapy (Signed)
[facility] [address] [place], [place], [address] Phone: [phone]   Fax:  [phone]  Physical Therapy Treatment  Patient Details  Name: Christopher Rasmussen MRN: [id number] Date of Birth: 1946-01-19 Referring Provider:  Aida PufferLittle, James, MD  Encounter Date: 11/03/2014      PT End of Session - 11/03/14 1330    Visit Number 15   Number of Visits 18   Date for PT Re-Evaluation 11/28/14   PT Start Time 1230   PT Stop Time 1324   PT Time Calculation (min) 54 min   Activity Tolerance Patient tolerated treatment well   Behavior [facility] Therapy [facility] for tasks assessed/performed      Past Medical History  Diagnosis Date  . Hypertension   . High cholesterol   . Type II diabetes mellitus   . Arthritis     Past Surgical History  Procedure Laterality Date  . Knee arthroscopy Left 1990's  . Total knee arthroplasty Left 08/22/2014    Procedure: LEFT TOTAL KNEE ARTHROPLASTY;  Surgeon: Kathryne Hitchhristopher Y Blackman, MD;  Location: [facility];  Service: Orthopedics;  Laterality: Left;  . Knee closed reduction Left 10/17/2014    Procedure: CLOSED MANIPULATION LEFT KNEE UNDER ANESTHESIA;  Surgeon: Kathryne Hitchhristopher Y Blackman, MD;  Location: [facility];  Service: Orthopedics;  Laterality: Left;    There were no vitals filed for this visit.  Visit Diagnosis:  Knee stiffness, left      Subjective Assessment - 11/03/14 1231    Symptoms Mild pain today.   Currently in Pain? Yes   Pain Score 1    Multiple Pain Sites Yes   Pain Score 4            [facility] PT Assessment - 11/03/14 1243    AROM   Left Knee Extension -18   Left Knee Flexion 85   PROM   Left Knee Extension -15                   [facility] Adult PT Treatment/Exercise - 11/03/14 1238    Knee/Hip Exercises: Aerobic   Stationary Bike Nustep LE only x 6 min L4   Manual Therapy   Joint Mobilization patella mobs all planes and grade 4  extension but more flexion mobs mobs supine    Passive ROM Emphasis on flexion range with contract relax and basic passive motion                  PT Short Term Goals - 10/21/14 1455    PT SHORT TERM GOAL #1   Title independent with HEP (10/14/14)   Status Achieved   PT SHORT TERM GOAL #2   Title improve L knee AROM 5-75 for improved function (10/14/14)   Status Achieved   PT SHORT TERM GOAL #3   Title improve gait velocity to > 1.0 ft/sec for improved mobility (10/14/14)   Status Achieved   PT SHORT TERM GOAL #4   Title ambulate > 250' with LRAD modified independent for improved mobility (10/14/14)   Status Achieved           PT Long Term Goals - 10/21/14 1455    PT LONG TERM GOAL #1   Title independent with advanced HEP (11/11/14)   Status On-going   PT LONG TERM GOAL #2   Title improve L knee AROM 0-95 for improved motion and mobility (11/11/14)   Status On-going   PT LONG TERM GOAL #3   Title improve gait velocity to >  1.5 ft/sec for improved mobility (11/11/14)   Status On-going   PT LONG TERM GOAL #4   Title demonstrate at least 4/5 L hip strength for improved gait and mobility (11/11/14)   Status On-going               Plan - 11/03/14 1330    Clinical Impression Statement No change in range. Will continue to push range   PT Next Visit Plan Continue agressive range, possible tape   Consulted and Agree with Plan of Care Patient        Problem List Patient Active Problem List   Diagnosis Date Noted  . Arthrofibrosis of total knee arthroplasty, left 10/17/2014  . Ileus, postoperative   . Acute renal failure with tubular necrosis   . Acute renal failure 08/23/2014  . Abdominal distension 08/23/2014  . Hyperkalemia 08/23/2014  . Arthritis of right knee 08/22/2014  . Status post total left knee replacement 08/22/2014  . Chest pain 02/22/2013  . HTN (hypertension) 02/22/2013  . HLD (hyperlipidemia) 02/22/2013  . Diabetes 02/22/2013    Caprice Red PT 11/03/2014, 1:32 PM  [facility] [address] [place], [place], [address] Phone: [phone]   Fax:  [phone]

## 2014-11-05 ENCOUNTER — Ambulatory Visit: Payer: Medicare PPO | Admitting: Physical Therapy

## 2014-11-05 DIAGNOSIS — M25562 Pain in left knee: Secondary | ICD-10-CM

## 2014-11-05 DIAGNOSIS — Z96652 Presence of left artificial knee joint: Secondary | ICD-10-CM | POA: Diagnosis not present

## 2014-11-05 DIAGNOSIS — R609 Edema, unspecified: Secondary | ICD-10-CM

## 2014-11-05 DIAGNOSIS — R29898 Other symptoms and signs involving the musculoskeletal system: Secondary | ICD-10-CM

## 2014-11-05 DIAGNOSIS — M25662 Stiffness of left knee, not elsewhere classified: Secondary | ICD-10-CM

## 2014-11-05 NOTE — Therapy (Signed)
[facility] [address] [place], [place], [address] Phone: [phone]   Fax:  [phone]  Physical Therapy Treatment  Patient Details  Name: Christopher Rasmussen MRN: [id number] Date of Birth: Jun 04, 1946 Referring Provider:  Aida Puffer, MD  Encounter Date: 11/05/2014      PT End of Session - 11/05/14 1507    Visit Number 16   Number of Visits 18   PT Start Time 1409   PT Stop Time 1502   PT Time Calculation (min) 53 min   Activity Tolerance Patient tolerated treatment well      Past Medical History  Diagnosis Date  . Hypertension   . High cholesterol   . Type II diabetes mellitus   . Arthritis     Past Surgical History  Procedure Laterality Date  . Knee arthroscopy Left 1990's  . Total knee arthroplasty Left 08/22/2014    Procedure: LEFT TOTAL KNEE ARTHROPLASTY;  Surgeon: Kathryne Hitch, MD;  Location: [facility];  Service: Orthopedics;  Laterality: Left;  . Knee closed reduction Left 10/17/2014    Procedure: CLOSED MANIPULATION LEFT KNEE UNDER ANESTHESIA;  Surgeon: Kathryne Hitch, MD;  Location: [facility];  Service: Orthopedics;  Laterality: Left;    There were no vitals filed for this visit.  Visit Diagnosis:  Knee stiffness, left  Weakness of left lower extremity  Edema  Left knee pain      Subjective Assessment - 11/05/14 1407    Symptoms Was more active yesterday, did some outside activities. I always feel good when I leave.   Currently in Pain? No/denies  stiffness   Pain Location Knee   Pain Orientation Left   Pain Descriptors / Indicators Sore            [facility] PT Assessment - 11/05/14 1447    Strength   Right Hip Extension 4/5   Left Hip Extension 3+/5   Left Knee Flexion 4+/5  within ROM          [facility] Adult PT Treatment/Exercise - 11/05/14 1411    Knee/Hip Exercises: Seated   Other Seated Knee Exercises seated slow hamstring curl x 20 with blue band   Knee/Hip Exercises:  Sidelying   Hip ABduction Strengthening;Both;2 sets;10 reps   Clams x 20 each   Knee/Hip Exercises: Prone   Hip Extension Strengthening;Both;1 set;10 reps   Contract/Relax to Increase Flexion with PT assist and manual passove flexion   Other Prone Exercises prone quad set x10    Manual Therapy   Manual Therapy Joint mobilization  done in sitting supine and prone for knee flexion and extens   Joint Mobilization patella mob   Manual Lymphatic Drainage (MLD) retrograde massage and K Tape 3 fans for edema   Passive ROM Gr. II-III jt mob                PT Education - 11/05/14 1507    Education provided Yes   Education Details kinesiotape, swelling/lymph   Person(s) Educated Patient   Methods Explanation   Comprehension Verbalized understanding          PT Short Term Goals - 10/21/14 1455    PT SHORT TERM GOAL #1   Title independent with HEP (10/14/14)   Status Achieved   PT SHORT TERM GOAL #2   Title improve L knee AROM 5-75 for improved function (10/14/14)   Status Achieved   PT SHORT TERM GOAL #3   Title improve gait velocity to > 1.0 ft/sec  for improved mobility (10/14/14)   Status Achieved   PT SHORT TERM GOAL #4   Title ambulate > 250' with LRAD modified independent for improved mobility (10/14/14)   Status Achieved           PT Long Term Goals - 11/05/14 1511    PT LONG TERM GOAL #1   Title independent with advanced HEP (11/11/14)   Status On-going   PT LONG TERM GOAL #2   Title improve L knee AROM 0-95 for improved motion and mobility (11/11/14)   Status On-going   PT LONG TERM GOAL #3   Title improve gait velocity to > 1.5 ft/sec for improved mobility (11/11/14)   Status On-going   PT LONG TERM GOAL #4   Title demonstrate at least 4/5 L hip strength for improved gait and mobility (11/11/14)   Status On-going               Plan - 11/05/14 1508    Clinical Impression Statement Able to get to 94 deg PROM flexion L knee.  Weak hip extension.    PT Next  Visit Plan assess tape, push ROM   PT Home Exercise Plan Cont same HEP, consider adding hip ext if not already   Consulted and Agree with Plan of Care Patient        Problem List Patient Active Problem List   Diagnosis Date Noted  . Arthrofibrosis of total knee arthroplasty, left 10/17/2014  . Ileus, postoperative   . Acute renal failure with tubular necrosis   . Acute renal failure 08/23/2014  . Abdominal distension 08/23/2014  . Hyperkalemia 08/23/2014  . Arthritis of right knee 08/22/2014  . Status post total left knee replacement 08/22/2014  . Chest pain 02/22/2013  . HTN (hypertension) 02/22/2013  . HLD (hyperlipidemia) 02/22/2013  . Diabetes 02/22/2013    PAA,JENNIFER 11/05/2014, 3:22 PM  [facility] [address] [place], [place], [address] Phone: [phone]   Fax:  [phone]

## 2014-11-07 ENCOUNTER — Ambulatory Visit: Payer: Medicare PPO | Attending: Orthopaedic Surgery

## 2014-11-07 DIAGNOSIS — M25662 Stiffness of left knee, not elsewhere classified: Secondary | ICD-10-CM | POA: Diagnosis not present

## 2014-11-07 DIAGNOSIS — Z96652 Presence of left artificial knee joint: Secondary | ICD-10-CM | POA: Insufficient documentation

## 2014-11-07 DIAGNOSIS — M25562 Pain in left knee: Secondary | ICD-10-CM | POA: Diagnosis not present

## 2014-11-07 NOTE — Therapy (Signed)
[facility] [address] [place], [place], [address] Phone: [phone]   Fax:  [phone]  Physical Therapy Treatment  Patient Details  Name: Christopher Rasmussen MRN: [id number] Date of Birth: 1945/11/17 Referring Provider:  Aida PufferLittle, James, MD  Encounter Date: 11/07/2014      PT End of Session - 11/07/14 1201    Visit Number 17   Number of Visits 18   Date for PT Re-Evaluation 11/28/14   Authorization - Number of Visits 12      Past Medical History  Diagnosis Date  . Hypertension   . High cholesterol   . Type II diabetes mellitus   . Arthritis     Past Surgical History  Procedure Laterality Date  . Knee arthroscopy Left 1990's  . Total knee arthroplasty Left 08/22/2014    Procedure: LEFT TOTAL KNEE ARTHROPLASTY;  Surgeon: Kathryne Hitchhristopher Y Blackman, MD;  Location: [facility];  Service: Orthopedics;  Laterality: Left;  . Knee closed reduction Left 10/17/2014    Procedure: CLOSED MANIPULATION LEFT KNEE UNDER ANESTHESIA;  Surgeon: Kathryne Hitchhristopher Y Blackman, MD;  Location: [facility];  Service: Orthopedics;  Laterality: Left;    There were no vitals filed for this visit.  Visit Diagnosis:  No diagnosis found.      Subjective Assessment - 11/07/14 1112    Symptoms Knee feels pretty good   Currently in Pain? Yes   Pain Score 1    Multiple Pain Sites Yes   Pain Score 4            [facility] PT Assessment - 11/07/14 1118    AROM   Left Knee Extension -18   Left Knee Flexion 85                   [facility] Adult PT Treatment/Exercise - 11/07/14 1112    Knee/Hip Exercises: Aerobic   Stationary Bike Nustep LE only x 6 min L4   Manual Therapy   Joint Mobilization patella mob   Passive ROM Flexion and extension range with over pressure     PA and AP mobs for flexion and extension.              PT Short Term Goals - 10/21/14 1455    PT SHORT TERM GOAL #1   Title independent with HEP (10/14/14)   Status Achieved   PT  SHORT TERM GOAL #2   Title improve L knee AROM 5-75 for improved function (10/14/14)   Status Achieved   PT SHORT TERM GOAL #3   Title improve gait velocity to > 1.0 ft/sec for improved mobility (10/14/14)   Status Achieved   PT SHORT TERM GOAL #4   Title ambulate > 250' with LRAD modified independent for improved mobility (10/14/14)   Status Achieved           PT Long Term Goals - 11/05/14 1511    PT LONG TERM GOAL #1   Title independent with advanced HEP (11/11/14)   Status On-going   PT LONG TERM GOAL #2   Title improve L knee AROM 0-95 for improved motion and mobility (11/11/14)   Status On-going   PT LONG TERM GOAL #3   Title improve gait velocity to > 1.5 ft/sec for improved mobility (11/11/14)   Status On-going   PT LONG TERM GOAL #4   Title demonstrate at least 4/5 L hip strength for improved gait and mobility (11/11/14)   Status On-going  Problem List Patient Active Problem List   Diagnosis Date Noted  . Arthrofibrosis of total knee arthroplasty, left 10/17/2014  . Ileus, postoperative   . Acute renal failure with tubular necrosis   . Acute renal failure 08/23/2014  . Abdominal distension 08/23/2014  . Hyperkalemia 08/23/2014  . Arthritis of right knee 08/22/2014  . Status post total left knee replacement 08/22/2014  . Chest pain 02/22/2013  . HTN (hypertension) 02/22/2013  . HLD (hyperlipidemia) 02/22/2013  . Diabetes 02/22/2013    Caprice Red PT 11/07/2014, 12:03 PM  [facility] [address] [place], [place], [address] Phone: [phone]   Fax:  [phone]

## 2014-11-10 ENCOUNTER — Ambulatory Visit: Payer: Medicare PPO | Admitting: Physical Therapy

## 2014-11-10 DIAGNOSIS — M25562 Pain in left knee: Secondary | ICD-10-CM

## 2014-11-10 DIAGNOSIS — R609 Edema, unspecified: Secondary | ICD-10-CM

## 2014-11-10 DIAGNOSIS — R29898 Other symptoms and signs involving the musculoskeletal system: Secondary | ICD-10-CM

## 2014-11-10 DIAGNOSIS — M25662 Stiffness of left knee, not elsewhere classified: Secondary | ICD-10-CM

## 2014-11-10 NOTE — Therapy (Signed)
[place], [place], [address] Phone: [phone]   Fax:  [phone]  Patient Details  Name: Christopher Rasmussen MRN: [id number] Date of Birth: 06-11-1946 Referring Provider:  Tamsen Roers, MD  Encounter Date: 11/10/2014  PHYSICAL THERAPY DISCHARGE SUMMARY  Visits from Start of Care: 16  Current functional level related to goals / functional outcomes: Did not meet goals for hip strength, knee AROM is inconsistent, last visit 85 deg flexion and lacks 18 deg extension   Remaining deficits: Lacks AROM and gait is stiff, pain in bilateral knees, glute strength   Education / Equipment: HEP, gait, ROM Plan: Patient agrees to discharge.  Patient goals were partially met. Patient is being discharged due to financial reasons.  ?????   Patient arrived today, he had already used 6 visits during the approved time period.  He exercised independently.  Patient will return for Rt. TKR in the near future.   Kary Colaizzi 11/10/2014, 1:42 PM  [facility] [address] [place], [place], [address] Phone: [phone]   Fax:  [phone]

## 2014-12-02 NOTE — Progress Notes (Signed)
Called requested orders be in Epic surgery 12-19-14 pre op 12-15-14 Thanks

## 2014-12-12 ENCOUNTER — Other Ambulatory Visit (HOSPITAL_COMMUNITY): Payer: Self-pay | Admitting: *Deleted

## 2014-12-12 ENCOUNTER — Other Ambulatory Visit (HOSPITAL_COMMUNITY): Payer: Self-pay | Admitting: Orthopaedic Surgery

## 2014-12-12 NOTE — Patient Instructions (Addendum)
Christopher MightyJohn B Rasmussen  12/12/2014   Your procedure is scheduled on: Friday 12/19/2014  [facility]  [facility] and follow signs to               Short Stay Center at 0530 AM.  Call this number if you have problems the morning of surgery [phone]   Remember: ONLY 1 PERSON MAY GO WITH YOU TO SHORT STAY TO GET  READY MORNING OF YOUR SURGERY.  Do not eat food or drink liquids :After Midnight.     Take these medicines the morning of surgery with A SIP OF WATER: Amlodipine                               You may not have any metal on your body including hair pins and              piercings  Do not wear jewelry, make-up, lotions, powders or perfumes.             Do not wear nail polish.  Do not shave  48 hours prior to surgery.              Men may shave face and neck.  Do not bring valuables to the hospital. [facility] IS NOT             RESPONSIBLE   FOR VALUABLES.  Contacts, dentures or bridgework may not be worn into surgery.  Leave suitcase in the car. After surgery it may be brought to your room.               Please read over the following fact sheets you were given: MRSA information _____________________________________________________________________           [facility] Pre[facility] Before surgery, you can play an important role.  Because skin is not sterile, your skin needs to be as free of germs as possible.  You can reduce the number of germs on your skin by washing with CHG (chlorahexidine gluconate) soap before surgery.  CHG is an antiseptic cleaner which kills germs and bonds with the skin to continue killing germs even after washing. Please DO NOT use if you have an allergy to CHG or antibacterial soaps.  If your skin becomes reddened/irritated stop using the CHG and inform your nurse when you arrive at Short Stay. Do not shave (including legs and underarms) for at least 48 hours prior to the first CHG shower.  You may shave your  face/neck. Please follow these instructions carefully:  1.  Shower with CHG Soap the night before surgery and the  morning of Surgery.  2.  If you choose to wash your hair, wash your hair first as usual with your  normal  shampoo.  3.  After you shampoo, rinse your hair and body thoroughly to remove the  shampoo.                            4.  Use CHG as you would any other liquid soap.  You can apply chg directly  to the skin and wash                       Gently with a scrungie or clean washcloth.  5.  Apply  the CHG Soap to your body ONLY FROM THE NECK DOWN.   Do not use on face/ open                           Wound or open sores. Avoid contact with eyes, ears mouth and genitals (private parts).                       Wash face,  Genitals (private parts) with your normal soap.             6.  Wash thoroughly, paying special attention to the area where your surgery  will be performed.  7.  Thoroughly rinse your body with warm water from the neck down.  8.  DO NOT shower/wash with your normal soap after using and rinsing off  the CHG Soap.                9.  Pat yourself dry with a clean towel.            10.  Wear clean pajamas.            11.  Place clean sheets on your bed the night of your first shower and do not  sleep with pets. Day of Surgery : Do not apply any lotions/deodorants the morning of surgery.  Please wear clean clothes to the hospital/surgery center.  FAILURE TO FOLLOW THESE INSTRUCTIONS MAY RESULT IN THE CANCELLATION OF YOUR SURGERY PATIENT SIGNATURE_________________________________  NURSE SIGNATURE__________________________________  ________________________________________________________________________   Christopher Rasmussen  An incentive spirometer is a tool that can help keep your lungs clear and active. This tool measures how well you are filling your lungs with each breath. Taking long deep breaths may help reverse or decrease the chance of developing breathing  (pulmonary) problems (especially infection) following:  A long period of time when you are unable to move or be active. BEFORE THE PROCEDURE   If the spirometer includes an indicator to show your best effort, your nurse or respiratory therapist will set it to a desired goal.  If possible, sit up straight or lean slightly forward. Try not to slouch.  Hold the incentive spirometer in an upright position. INSTRUCTIONS FOR USE   Sit on the edge of your bed if possible, or sit up as far as you can in bed or on a chair.  Hold the incentive spirometer in an upright position.  Breathe out normally.  Place the mouthpiece in your mouth and seal your lips tightly around it.  Breathe in slowly and as deeply as possible, raising the piston or the ball toward the top of the column.  Hold your breath for 3-5 seconds or for as long as possible. Allow the piston or ball to fall to the bottom of the column.  Remove the mouthpiece from your mouth and breathe out normally.  Rest for a few seconds and repeat Steps 1 through 7 at least 10 times every 1-2 hours when you are awake. Take your time and take a few normal breaths between deep breaths.  The spirometer may include an indicator to show your best effort. Use the indicator as a goal to work toward during each repetition.  After each set of 10 deep breaths, practice coughing to be sure your lungs are clear. If you have an incision (the cut made at the time of surgery), support your incision when coughing by placing a pillow or rolled  up towels firmly against it. Once you are able to get out of bed, walk around indoors and cough well. You may stop using the incentive spirometer when instructed by your caregiver.  RISKS AND COMPLICATIONS  Take your time so you do not get dizzy or light-headed.  If you are in pain, you may need to take or ask for pain medication before doing incentive spirometry. It is harder to take a deep breath if you are having  pain. AFTER USE  Rest and breathe slowly and easily.  It can be helpful to keep track of a log of your progress. Your caregiver can provide you with a simple table to help with this. If you are using the spirometer at home, follow these instructions: [place] IF:   You are having difficultly using the spirometer.  You have trouble using the spirometer as often as instructed.  Your pain medication is not giving enough relief while using the spirometer.  You develop fever of 100.5 F (38.1 C) or higher. SEEK IMMEDIATE MEDICAL CARE IF:   You cough up bloody sputum that had not been present before.  You develop fever of 102 F (38.9 C) or greater.  You develop worsening pain at or near the incision site. MAKE SURE YOU:   Understand these instructions.  Will watch your condition.  Will get help right away if you are not doing well or get worse. Document Released: 12/05/2006 Document Revised: 10/17/2011 Document Reviewed: 02/05/2007 ExitCare Patient Information 2014 ExitCare, [place].   ________________________________________________________________________  WHAT IS A BLOOD TRANSFUSION? Blood Transfusion Information  A transfusion is the replacement of blood or some of its parts. Blood is made up of multiple cells which provide different functions.  Red blood cells carry oxygen and are used for blood loss replacement.  White blood cells fight against infection.  Platelets control bleeding.  Plasma helps clot blood.  Other blood products are available for specialized needs, such as hemophilia or other clotting disorders. BEFORE THE TRANSFUSION  Who gives blood for transfusions?   Healthy volunteers who are fully evaluated to make sure their blood is safe. This is blood bank blood. Transfusion therapy is the safest it has ever been in the practice of medicine. Before blood is taken from a donor, a complete history is taken to make sure that person has no history  of diseases nor engages in risky social behavior (examples are intravenous drug use or sexual activity with multiple partners). The donor's travel history is screened to minimize risk of transmitting infections, such as malaria. The donated blood is tested for signs of infectious diseases, such as HIV and hepatitis. The blood is then tested to be sure it is compatible with you in order to minimize the chance of a transfusion reaction. If you or a relative donates blood, this is often done in anticipation of surgery and is not appropriate for emergency situations. It takes many days to process the donated blood. RISKS AND COMPLICATIONS Although transfusion therapy is very safe and saves many lives, the main dangers of transfusion include:   Getting an infectious disease.  Developing a transfusion reaction. This is an allergic reaction to something in the blood you were given. Every precaution is taken to prevent this. The decision to have a blood transfusion has been considered carefully by your caregiver before blood is given. Blood is not given unless the benefits outweigh the risks. AFTER THE TRANSFUSION  Right after receiving a blood transfusion, you will usually feel  much better and more energetic. This is especially true if your red blood cells have gotten low (anemic). The transfusion raises the level of the red blood cells which carry oxygen, and this usually causes an energy increase.  The nurse administering the transfusion will monitor you carefully for complications. HOME CARE INSTRUCTIONS  No special instructions are needed after a transfusion. You may find your energy is better. Speak with your caregiver about any limitations on activity for underlying diseases you may have. SEEK MEDICAL CARE IF:   Your condition is not improving after your transfusion.  You develop redness or irritation at the intravenous (IV) site. SEEK IMMEDIATE MEDICAL CARE IF:  Any of the following symptoms  occur over the next 12 hours:  Shaking chills.  You have a temperature by mouth above 102 F (38.9 C), not controlled by medicine.  Chest, back, or muscle pain.  People around you feel you are not acting correctly or are confused.  Shortness of breath or difficulty breathing.  Dizziness and fainting.  You get a rash or develop hives.  You have a decrease in urine output.  Your urine turns a dark color or changes to pink, red, or brown. Any of the following symptoms occur over the next 10 days:  You have a temperature by mouth above 102 F (38.9 C), not controlled by medicine.  Shortness of breath.  Weakness after normal activity.  The white part of the eye turns yellow (jaundice).  You have a decrease in the amount of urine or are urinating less often.  Your urine turns a dark color or changes to pink, red, or brown. Document Released: 07/22/2000 Document Revised: 10/17/2011 Document Reviewed: 03/10/2008 [facility] Patient Information 2014 [place].  _______________________________________________________________________

## 2014-12-12 NOTE — Progress Notes (Addendum)
10/16/2014-Bilateral Carotid Study from Dr. Jacinto HalimGanji on chart. 10/22/2013-LOV and EKG from Dr. Jacinto HalimGanji on chart. 03/07/2013-Transthoracic Echocardiogram Study from Dr. Jacinto HalimGanji on chart. EKG 08/15/14 on EPIC

## 2014-12-15 ENCOUNTER — Encounter (HOSPITAL_COMMUNITY)
Admission: RE | Admit: 2014-12-15 | Discharge: 2014-12-15 | Disposition: A | Discharge status: Home / Self Care | Payer: Medicare PPO | Source: Ambulatory Visit | Attending: Orthopaedic Surgery | Admitting: Orthopaedic Surgery

## 2014-12-15 ENCOUNTER — Encounter (HOSPITAL_COMMUNITY): Payer: Self-pay

## 2014-12-15 DIAGNOSIS — Z01812 Encounter for preprocedural laboratory examination: Secondary | ICD-10-CM | POA: Diagnosis present

## 2014-12-15 DIAGNOSIS — M1711 Unilateral primary osteoarthritis, right knee: Secondary | ICD-10-CM | POA: Insufficient documentation

## 2014-12-15 LAB — CBC
HCT: 45.3 % (ref 39.0–52.0)
HEMOGLOBIN: 15.6 g/dL (ref 13.0–17.0)
MCH: 28.1 pg (ref 26.0–34.0)
MCHC: 34.4 g/dL (ref 30.0–36.0)
MCV: 81.6 fL (ref 78.0–100.0)
Platelets: 197 10*3/uL (ref 150–400)
RBC: 5.55 MIL/uL (ref 4.22–5.81)
RDW: 15.1 % (ref 11.5–15.5)
WBC: 7.9 10*3/uL (ref 4.0–10.5)

## 2014-12-15 LAB — BASIC METABOLIC PANEL
Anion gap: 10 (ref 5–15)
BUN: 14 mg/dL (ref 6–20)
CALCIUM: 9.6 mg/dL (ref 8.9–10.3)
CO2: 27 mmol/L (ref 22–32)
CREATININE: 0.6 mg/dL — AB (ref 0.61–1.24)
Chloride: 102 mmol/L (ref 101–111)
GFR calc Af Amer: >60 mL/min (ref >60)
GFR calc non Af Amer: >60 mL/min (ref >60)
Glucose, Bld: 93 mg/dL (ref 70–99)
POTASSIUM: 4 mmol/L (ref 3.5–5.1)
Sodium: 139 mmol/L (ref 135–145)

## 2014-12-15 LAB — APTT: aPTT: 31 seconds (ref 24–37)

## 2014-12-15 LAB — SURGICAL PCR SCREEN
MRSA, PCR: NEGATIVE
Staphylococcus aureus: NEGATIVE

## 2014-12-15 LAB — PROTIME-INR
INR: 1.04 (ref 0.00–1.49)
Prothrombin Time: 13.8 seconds (ref 11.6–15.2)

## 2014-12-15 NOTE — Progress Notes (Signed)
   12/15/14 1306  OBSTRUCTIVE SLEEP APNEA  Have you ever been diagnosed with sleep apnea through a sleep study? No  Do you snore loudly (loud enough to be heard through closed doors)?  0  Do you often feel tired, fatigued, or sleepy during the daytime? 0  Has anyone observed you stop breathing during your sleep? 0  Do you have, or are you being treated for high blood pressure? 1  BMI more than 35 kg/m2? 0  Age over 68 years old? 1  Neck circumference greater than 40 cm/16 inches? 1  Gender: 1  Obstructive Sleep Apnea Score 4

## 2014-12-19 ENCOUNTER — Encounter (HOSPITAL_COMMUNITY): Payer: Self-pay | Admitting: *Deleted

## 2014-12-19 ENCOUNTER — Inpatient Hospital Stay (HOSPITAL_COMMUNITY): Payer: Medicare PPO | Admitting: Anesthesiology

## 2014-12-19 ENCOUNTER — Inpatient Hospital Stay (HOSPITAL_COMMUNITY)
Admission: RE | Admit: 2014-12-19 | Discharge: 2014-12-22 | DRG: 470 | Disposition: A | Discharge status: Home Health | Payer: Medicare PPO | Source: Ambulatory Visit | Attending: Orthopaedic Surgery | Admitting: Orthopaedic Surgery

## 2014-12-19 ENCOUNTER — Encounter (HOSPITAL_COMMUNITY)
Admission: RE | Disposition: A | Discharge status: Home Health | Payer: Self-pay | Source: Ambulatory Visit | Attending: Orthopaedic Surgery

## 2014-12-19 ENCOUNTER — Inpatient Hospital Stay (HOSPITAL_COMMUNITY): Payer: Medicare PPO

## 2014-12-19 DIAGNOSIS — M21161 Varus deformity, not elsewhere classified, right knee: Secondary | ICD-10-CM | POA: Diagnosis present

## 2014-12-19 DIAGNOSIS — Z8249 Family history of ischemic heart disease and other diseases of the circulatory system: Secondary | ICD-10-CM | POA: Diagnosis not present

## 2014-12-19 DIAGNOSIS — E780 Pure hypercholesterolemia: Secondary | ICD-10-CM | POA: Diagnosis present

## 2014-12-19 DIAGNOSIS — M25561 Pain in right knee: Secondary | ICD-10-CM | POA: Diagnosis present

## 2014-12-19 DIAGNOSIS — Z823 Family history of stroke: Secondary | ICD-10-CM

## 2014-12-19 DIAGNOSIS — Z96651 Presence of right artificial knee joint: Secondary | ICD-10-CM

## 2014-12-19 DIAGNOSIS — M24662 Ankylosis, left knee: Secondary | ICD-10-CM | POA: Diagnosis present

## 2014-12-19 DIAGNOSIS — Z01812 Encounter for preprocedural laboratory examination: Secondary | ICD-10-CM | POA: Diagnosis not present

## 2014-12-19 DIAGNOSIS — I10 Essential (primary) hypertension: Secondary | ICD-10-CM | POA: Diagnosis present

## 2014-12-19 DIAGNOSIS — E119 Type 2 diabetes mellitus without complications: Secondary | ICD-10-CM | POA: Diagnosis present

## 2014-12-19 DIAGNOSIS — M659 Synovitis and tenosynovitis, unspecified: Secondary | ICD-10-CM | POA: Diagnosis present

## 2014-12-19 DIAGNOSIS — R944 Abnormal results of kidney function studies: Secondary | ICD-10-CM | POA: Diagnosis not present

## 2014-12-19 DIAGNOSIS — M1711 Unilateral primary osteoarthritis, right knee: Secondary | ICD-10-CM

## 2014-12-19 DIAGNOSIS — Z96652 Presence of left artificial knee joint: Secondary | ICD-10-CM | POA: Diagnosis present

## 2014-12-19 DIAGNOSIS — Z794 Long term (current) use of insulin: Secondary | ICD-10-CM

## 2014-12-19 HISTORY — PX: TOTAL KNEE ARTHROPLASTY: SHX125

## 2014-12-19 HISTORY — PX: KNEE CLOSED REDUCTION: SHX995

## 2014-12-19 LAB — GLUCOSE, CAPILLARY
GLUCOSE-CAPILLARY: 236 mg/dL — AB (ref 65–99)
Glucose-Capillary: 149 mg/dL — ABNORMAL HIGH (ref 65–99)
Glucose-Capillary: 117 mg/dL — ABNORMAL HIGH (ref 65–99)

## 2014-12-19 SURGERY — ARTHROPLASTY, KNEE, TOTAL
Anesthesia: Spinal | Site: Knee | Laterality: Right

## 2014-12-19 MED ORDER — ONDANSETRON HCL 4 MG/2ML IJ SOLN
INTRAMUSCULAR | Status: DC | PRN
  Administered 2014-12-19: 4 mg via INTRAVENOUS

## 2014-12-19 MED ORDER — ASPIRIN EC 325 MG PO TBEC
325.0000 mg | DELAYED_RELEASE_TABLET | Freq: Two times a day (BID) | ORAL | Status: DC
  Administered 2014-12-19 – 2014-12-22 (×6): 325 mg via ORAL
  Filled 2014-12-19 (×8): qty 1

## 2014-12-19 MED ORDER — ALUM & MAG HYDROXIDE-SIMETH 200-200-20 MG/5ML PO SUSP: 30.0000 mL | ORAL | Status: DC | PRN

## 2014-12-19 MED ORDER — SODIUM CHLORIDE 0.9 % IJ SOLN
INTRAMUSCULAR | Status: AC
  Filled 2014-12-19: qty 50

## 2014-12-19 MED ORDER — EPHEDRINE SULFATE 50 MG/ML IJ SOLN
INTRAMUSCULAR | Status: DC | PRN
Start: 2014-12-19 — End: 2014-12-19
  Administered 2014-12-19 (×3): 5 mg via INTRAVENOUS
  Administered 2014-12-19: 10 mg via INTRAVENOUS

## 2014-12-19 MED ORDER — CEFAZOLIN SODIUM-DEXTROSE 2-3 GM-% IV SOLR
INTRAVENOUS | Status: DC | PRN
  Administered 2014-12-19: 2 g via INTRAVENOUS

## 2014-12-19 MED ORDER — ONDANSETRON HCL 4 MG PO TABS: 4.0000 mg | ORAL_TABLET | Freq: Four times a day (QID) | ORAL | Status: DC | PRN

## 2014-12-19 MED ORDER — ACETAMINOPHEN 650 MG RE SUPP: 650.0000 mg | Freq: Four times a day (QID) | RECTAL | Status: DC | PRN

## 2014-12-19 MED ORDER — FENTANYL CITRATE (PF) 100 MCG/2ML IJ SOLN
INTRAMUSCULAR | Status: DC | PRN
  Administered 2014-12-19: 25 ug via INTRAVENOUS
  Administered 2014-12-19: 50 ug via INTRAVENOUS
  Administered 2014-12-19: 25 ug via INTRAVENOUS

## 2014-12-19 MED ORDER — FENTANYL CITRATE (PF) 100 MCG/2ML IJ SOLN: 25.0000 ug | INTRAMUSCULAR | Status: DC | PRN

## 2014-12-19 MED ORDER — PROPOFOL 10 MG/ML IV BOLUS
INTRAVENOUS | Status: AC
  Filled 2014-12-19: qty 20

## 2014-12-19 MED ORDER — BUPIVACAINE IN DEXTROSE 0.75-8.25 % IT SOLN
INTRATHECAL | Status: DC | PRN
  Administered 2014-12-19: 2 mL via INTRATHECAL

## 2014-12-19 MED ORDER — METHOCARBAMOL 500 MG PO TABS
500.0000 mg | ORAL_TABLET | Freq: Four times a day (QID) | ORAL | Status: DC | PRN
  Administered 2014-12-19 – 2014-12-22 (×5): 500 mg via ORAL
  Filled 2014-12-19 (×5): qty 1

## 2014-12-19 MED ORDER — PROPOFOL INFUSION 10 MG/ML OPTIME
INTRAVENOUS | Status: DC | PRN
  Administered 2014-12-19: 100 ug/kg/min via INTRAVENOUS

## 2014-12-19 MED ORDER — PHENOL 1.4 % MT LIQD
1.0000 | OROMUCOSAL | Status: DC | PRN
  Filled 2014-12-19: qty 177

## 2014-12-19 MED ORDER — PHENYLEPHRINE 40 MCG/ML (10ML) SYRINGE FOR IV PUSH (FOR BLOOD PRESSURE SUPPORT)
PREFILLED_SYRINGE | INTRAVENOUS | Status: AC
  Filled 2014-12-19: qty 10

## 2014-12-19 MED ORDER — OXYCODONE HCL 5 MG PO TABS
5.0000 mg | ORAL_TABLET | ORAL | Status: DC | PRN
  Administered 2014-12-19 (×2): 15 mg via ORAL
  Administered 2014-12-19: 5 mg via ORAL
  Administered 2014-12-19: 10 mg via ORAL
  Administered 2014-12-19 – 2014-12-21 (×5): 15 mg via ORAL
  Administered 2014-12-21 – 2014-12-22 (×3): 10 mg via ORAL
  Administered 2014-12-22: 5 mg via ORAL
  Administered 2014-12-22: 10 mg via ORAL
  Filled 2014-12-19: qty 3
  Filled 2014-12-19: qty 1
  Filled 2014-12-19 (×2): qty 3
  Filled 2014-12-19: qty 2
  Filled 2014-12-19 (×2): qty 3
  Filled 2014-12-19: qty 1
  Filled 2014-12-19: qty 2
  Filled 2014-12-19 (×2): qty 3
  Filled 2014-12-19 (×3): qty 2

## 2014-12-19 MED ORDER — METHOCARBAMOL 1000 MG/10ML IJ SOLN
500.0000 mg | Freq: Four times a day (QID) | INTRAVENOUS | Status: DC | PRN
  Administered 2014-12-19: 500 mg via INTRAVENOUS
  Filled 2014-12-19 (×2): qty 5

## 2014-12-19 MED ORDER — SODIUM CHLORIDE 0.9 % IJ SOLN
INTRAMUSCULAR | Status: DC | PRN
  Administered 2014-12-19: 40 mL

## 2014-12-19 MED ORDER — CEFAZOLIN SODIUM-DEXTROSE 2-3 GM-% IV SOLR: 2.0000 g | INTRAVENOUS | Status: DC

## 2014-12-19 MED ORDER — MIDAZOLAM HCL 2 MG/2ML IJ SOLN
INTRAMUSCULAR | Status: AC
  Filled 2014-12-19: qty 2

## 2014-12-19 MED ORDER — MIDAZOLAM HCL 5 MG/5ML IJ SOLN
INTRAMUSCULAR | Status: DC | PRN
  Administered 2014-12-19: 2 mg via INTRAVENOUS

## 2014-12-19 MED ORDER — CEFAZOLIN SODIUM-DEXTROSE 2-3 GM-% IV SOLR
INTRAVENOUS | Status: AC
  Filled 2014-12-19: qty 50

## 2014-12-19 MED ORDER — SODIUM CHLORIDE 0.9 % IR SOLN
Status: DC | PRN
Start: 2014-12-19 — End: 2014-12-19
  Administered 2014-12-19: 3000 mL

## 2014-12-19 MED ORDER — CEFAZOLIN SODIUM 1-5 GM-% IV SOLN
1.0000 g | Freq: Four times a day (QID) | INTRAVENOUS | Status: AC
  Administered 2014-12-19 (×2): 1 g via INTRAVENOUS
  Filled 2014-12-19 (×2): qty 50

## 2014-12-19 MED ORDER — INSULIN ASPART 100 UNIT/ML ~~LOC~~ SOLN: 0.0000 [IU] | Freq: Every day | SUBCUTANEOUS | Status: DC

## 2014-12-19 MED ORDER — ONDANSETRON HCL 4 MG/2ML IJ SOLN: 4.0000 mg | Freq: Four times a day (QID) | INTRAMUSCULAR | Status: DC | PRN

## 2014-12-19 MED ORDER — FENTANYL CITRATE (PF) 100 MCG/2ML IJ SOLN
INTRAMUSCULAR | Status: AC
  Filled 2014-12-19: qty 2

## 2014-12-19 MED ORDER — METOCLOPRAMIDE HCL 5 MG/ML IJ SOLN
5.0000 mg | Freq: Three times a day (TID) | INTRAMUSCULAR | Status: DC | PRN
Start: 2014-12-19 — End: 2014-12-22

## 2014-12-19 MED ORDER — PHENYLEPHRINE HCL 10 MG/ML IJ SOLN
INTRAMUSCULAR | Status: DC | PRN
  Administered 2014-12-19: 80 ug via INTRAVENOUS
  Administered 2014-12-19: 40 ug via INTRAVENOUS
  Administered 2014-12-19 (×4): 80 ug via INTRAVENOUS
  Administered 2014-12-19: 40 ug via INTRAVENOUS

## 2014-12-19 MED ORDER — INSULIN ASPART 100 UNIT/ML ~~LOC~~ SOLN
0.0000 [IU] | Freq: Three times a day (TID) | SUBCUTANEOUS | Status: DC
  Administered 2014-12-19: 5 [IU] via SUBCUTANEOUS
  Administered 2014-12-19 – 2014-12-20 (×3): 3 [IU] via SUBCUTANEOUS
  Administered 2014-12-20 – 2014-12-21 (×2): 2 [IU] via SUBCUTANEOUS
  Administered 2014-12-22: 3 [IU] via SUBCUTANEOUS

## 2014-12-19 MED ORDER — METOPROLOL SUCCINATE ER 50 MG PO TB24
50.0000 mg | ORAL_TABLET | Freq: Every day | ORAL | Status: DC
  Administered 2014-12-19 – 2014-12-21 (×3): 50 mg via ORAL
  Filled 2014-12-19 (×4): qty 1

## 2014-12-19 MED ORDER — SIMVASTATIN 10 MG PO TABS
10.0000 mg | ORAL_TABLET | Freq: Every day | ORAL | Status: DC
  Administered 2014-12-19 – 2014-12-21 (×3): 10 mg via ORAL
  Filled 2014-12-19 (×4): qty 1

## 2014-12-19 MED ORDER — INSULIN ASPART 100 UNIT/ML ~~LOC~~ SOLN
10.0000 [IU] | Freq: Every day | SUBCUTANEOUS | Status: DC
  Administered 2014-12-19 – 2014-12-21 (×3): 10 [IU] via SUBCUTANEOUS

## 2014-12-19 MED ORDER — MEPERIDINE HCL 50 MG/ML IJ SOLN: 6.2500 mg | INTRAMUSCULAR | Status: DC | PRN

## 2014-12-19 MED ORDER — METOCLOPRAMIDE HCL 10 MG PO TABS: 5.0000 mg | ORAL_TABLET | Freq: Three times a day (TID) | ORAL | Status: DC | PRN

## 2014-12-19 MED ORDER — DIPHENHYDRAMINE HCL 12.5 MG/5ML PO ELIX: 12.5000 mg | ORAL_SOLUTION | ORAL | Status: DC | PRN

## 2014-12-19 MED ORDER — INSULIN ASPART PROT & ASPART (70-30 MIX) 100 UNIT/ML ~~LOC~~ SUSP
30.0000 [IU] | Freq: Every morning | SUBCUTANEOUS | Status: DC
  Administered 2014-12-19 – 2014-12-22 (×4): 30 [IU] via SUBCUTANEOUS
  Filled 2014-12-19: qty 10

## 2014-12-19 MED ORDER — TRANEXAMIC ACID 1000 MG/10ML IV SOLN
1000.0000 mg | INTRAVENOUS | Status: AC
  Administered 2014-12-19: 1000 mg via INTRAVENOUS
  Filled 2014-12-19: qty 10

## 2014-12-19 MED ORDER — HYDROMORPHONE HCL 1 MG/ML IJ SOLN
1.0000 mg | INTRAMUSCULAR | Status: DC | PRN
  Administered 2014-12-19: 1 mg via INTRAVENOUS
  Filled 2014-12-19: qty 1

## 2014-12-19 MED ORDER — OXYCODONE HCL ER 20 MG PO T12A
20.0000 mg | EXTENDED_RELEASE_TABLET | Freq: Two times a day (BID) | ORAL | Status: DC
  Administered 2014-12-19 – 2014-12-20 (×4): 20 mg via ORAL
  Filled 2014-12-19 (×4): qty 1

## 2014-12-19 MED ORDER — PROMETHAZINE HCL 25 MG/ML IJ SOLN: 6.2500 mg | INTRAMUSCULAR | Status: DC | PRN

## 2014-12-19 MED ORDER — ONDANSETRON HCL 4 MG/2ML IJ SOLN
INTRAMUSCULAR | Status: AC
  Filled 2014-12-19: qty 2

## 2014-12-19 MED ORDER — DOCUSATE SODIUM 100 MG PO CAPS
100.0000 mg | ORAL_CAPSULE | Freq: Two times a day (BID) | ORAL | Status: DC
  Administered 2014-12-19 – 2014-12-22 (×6): 100 mg via ORAL
  Filled 2014-12-19 (×2): qty 1

## 2014-12-19 MED ORDER — LACTATED RINGERS IV SOLN
INTRAVENOUS | Status: DC | PRN
  Administered 2014-12-19 (×2): via INTRAVENOUS

## 2014-12-19 MED ORDER — FAMOTIDINE 20 MG PO TABS
20.0000 mg | ORAL_TABLET | Freq: Every day | ORAL | Status: DC
  Administered 2014-12-19 – 2014-12-22 (×4): 20 mg via ORAL
  Filled 2014-12-19 (×4): qty 1

## 2014-12-19 MED ORDER — ZOLPIDEM TARTRATE 5 MG PO TABS: 5.0000 mg | ORAL_TABLET | Freq: Every evening | ORAL | Status: DC | PRN

## 2014-12-19 MED ORDER — BUPIVACAINE LIPOSOME 1.3 % IJ SUSP
20.0000 mL | Freq: Once | INTRAMUSCULAR | Status: AC
  Administered 2014-12-19: 20 mL
  Filled 2014-12-19: qty 20

## 2014-12-19 MED ORDER — LIDOCAINE HCL (CARDIAC) 20 MG/ML IV SOLN
INTRAVENOUS | Status: AC
  Filled 2014-12-19: qty 5

## 2014-12-19 MED ORDER — ACETAMINOPHEN 325 MG PO TABS: 650.0000 mg | ORAL_TABLET | Freq: Four times a day (QID) | ORAL | Status: DC | PRN

## 2014-12-19 MED ORDER — MENTHOL 3 MG MT LOZG: 1.0000 | LOZENGE | OROMUCOSAL | Status: DC | PRN

## 2014-12-19 MED ORDER — PHENYLEPHRINE HCL 10 MG/ML IJ SOLN
INTRAMUSCULAR | Status: AC
  Filled 2014-12-19: qty 1

## 2014-12-19 MED ORDER — SODIUM CHLORIDE 0.9 % IV SOLN
INTRAVENOUS | Status: DC
  Administered 2014-12-19 – 2014-12-20 (×2): via INTRAVENOUS

## 2014-12-19 SURGICAL SUPPLY — 66 items
APL SKNCLS STERI-STRIP NONHPOA (GAUZE/BANDAGES/DRESSINGS) ×2
BAG SPEC THK2 15X12 ZIP CLS (MISCELLANEOUS)
BAG ZIPLOCK 12X15 (MISCELLANEOUS) IMPLANT
BANDAGE ADH SHEER 1  50/CT (GAUZE/BANDAGES/DRESSINGS) IMPLANT
BANDAGE ELASTIC 6 VELCRO ST LF (GAUZE/BANDAGES/DRESSINGS) ×4 IMPLANT
BANDAGE ESMARK 6X9 LF (GAUZE/BANDAGES/DRESSINGS) ×2 IMPLANT
BENZOIN TINCTURE PRP APPL 2/3 (GAUZE/BANDAGES/DRESSINGS) ×2 IMPLANT
BLADE SAG 13.0X1.37X90 (BLADE) IMPLANT
BLADE SAG 18X100X1.27 (BLADE) ×2 IMPLANT
BNDG CMPR 9X6 STRL LF SNTH (GAUZE/BANDAGES/DRESSINGS) ×2
BNDG ESMARK 6X9 LF (GAUZE/BANDAGES/DRESSINGS) ×4
BOWL SMART MIX CTS (DISPOSABLE) ×4 IMPLANT
CAPT KNEE TOTAL 3 ×2 IMPLANT
CEMENT BONE 1-PACK (Cement) ×8 IMPLANT
CLOSURE WOUND 1/2 X4 (GAUZE/BANDAGES/DRESSINGS) ×1
CUFF TOURN SGL QUICK 34 (TOURNIQUET CUFF) ×4
CUFF TRNQT CYL 34X4X40X1 (TOURNIQUET CUFF) ×2 IMPLANT
DRAPE EXTREMITY T 121X128X90 (DRAPE) ×4 IMPLANT
DRAPE POUCH INSTRU U-SHP 10X18 (DRAPES) ×4 IMPLANT
DRAPE SHEET LG 3/4 BI-LAMINATE (DRAPES) IMPLANT
DRAPE U-SHAPE 47X51 STRL (DRAPES) ×4 IMPLANT
DRSG AQUACEL AG ADV 3.5X10 (GAUZE/BANDAGES/DRESSINGS) ×4 IMPLANT
DRSG PAD ABDOMINAL 8X10 ST (GAUZE/BANDAGES/DRESSINGS) ×6 IMPLANT
DURAPREP 26ML APPLICATOR (WOUND CARE) ×4 IMPLANT
ELECT REM PT RETURN 9FT ADLT (ELECTROSURGICAL) ×4
ELECTRODE REM PT RTRN 9FT ADLT (ELECTROSURGICAL) ×2 IMPLANT
FACESHIELD WRAPAROUND (MASK) ×20 IMPLANT
FACESHIELD WRAPAROUND OR TEAM (MASK) ×10 IMPLANT
GAUZE SPONGE 4X4 12PLY STRL (GAUZE/BANDAGES/DRESSINGS) ×4 IMPLANT
GAUZE XEROFORM 1X8 LF (GAUZE/BANDAGES/DRESSINGS) IMPLANT
GLOVE BIO SURGEON STRL SZ7.5 (GLOVE) ×6 IMPLANT
GLOVE BIOGEL PI IND STRL 8 (GLOVE) ×4 IMPLANT
GLOVE BIOGEL PI INDICATOR 8 (GLOVE) ×4
GLOVE ECLIPSE 8.0 STRL XLNG CF (GLOVE) ×4 IMPLANT
GOWN STRL REUS W/TWL XL LVL3 (GOWN DISPOSABLE) ×8 IMPLANT
HANDPIECE INTERPULSE COAX TIP (DISPOSABLE) ×4
IMMOBILIZER KNEE 20 (SOFTGOODS) ×4
IMMOBILIZER KNEE 20 THIGH 36 (SOFTGOODS) ×2 IMPLANT
KIT BASIN OR (CUSTOM PROCEDURE TRAY) ×4 IMPLANT
NDL SAFETY ECLIPSE 18X1.5 (NEEDLE) IMPLANT
NEEDLE HYPO 18GX1.5 SHARP (NEEDLE) ×4
NS IRRIG 1000ML POUR BTL (IV SOLUTION) ×4 IMPLANT
PACK TOTAL JOINT (CUSTOM PROCEDURE TRAY) ×4 IMPLANT
PADDING CAST COTTON 6X4 STRL (CAST SUPPLIES) ×6 IMPLANT
PEN SKIN MARKING BROAD (MISCELLANEOUS) ×4 IMPLANT
POSITIONER SURGICAL ARM (MISCELLANEOUS) ×4 IMPLANT
SET HNDPC FAN SPRY TIP SCT (DISPOSABLE) ×2 IMPLANT
SET PAD KNEE POSITIONER (MISCELLANEOUS) ×4 IMPLANT
STAPLER VISISTAT 35W (STAPLE) IMPLANT
STRIP CLOSURE SKIN 1/2X4 (GAUZE/BANDAGES/DRESSINGS) ×1 IMPLANT
SUCTION FRAZIER 12FR DISP (SUCTIONS) ×4 IMPLANT
SUT MNCRL AB 4-0 PS2 18 (SUTURE) ×2 IMPLANT
SUT VIC AB 0 CT1 27 (SUTURE) ×4
SUT VIC AB 0 CT1 27XBRD ANTBC (SUTURE) ×2 IMPLANT
SUT VIC AB 0 CT1 36 (SUTURE) ×2 IMPLANT
SUT VIC AB 1 CT1 27 (SUTURE) ×8
SUT VIC AB 1 CT1 27XBRD ANTBC (SUTURE) ×4 IMPLANT
SUT VIC AB 2-0 CT1 27 (SUTURE) ×8
SUT VIC AB 2-0 CT1 TAPERPNT 27 (SUTURE) ×4 IMPLANT
SYR CONTROL 10ML LL (SYRINGE) IMPLANT
TOWEL OR 17X26 10 PK STRL BLUE (TOWEL DISPOSABLE) ×8 IMPLANT
TOWEL OR NON WOVEN STRL DISP B (DISPOSABLE) ×2 IMPLANT
TRAY FOLEY W/METER SILVER 14FR (SET/KITS/TRAYS/PACK) ×2 IMPLANT
WATER STERILE IRR 1500ML POUR (IV SOLUTION) ×4 IMPLANT
WRAP KNEE MAXI GEL POST OP (GAUZE/BANDAGES/DRESSINGS) ×4 IMPLANT
YANKAUER SUCT BULB TIP 10FT TU (MISCELLANEOUS) ×4 IMPLANT

## 2014-12-19 NOTE — Anesthesia Postprocedure Evaluation (Signed)
  Anesthesia Post-op Note  Patient: Christopher MightyJohn B Rasmussen  Procedure(s) Performed: Procedure(s) (LRB): RIGHT TOTAL KNEE ARTHROPLASTY (Right) CLOSED MANIPULATION  LEFT KNEE (Left)  Patient Location: PACU  Anesthesia Type: Spinal  Level of Consciousness: awake and alert   Airway and Oxygen Therapy: Patient Spontanous Breathing  Post-op Pain: mild  Post-op Assessment: Post-op Vital signs reviewed, Patient's Cardiovascular Status Stable, Respiratory Function Stable, Patent Airway and No signs of Nausea or vomiting  Last Vitals:  Filed Vitals:   12/19/14 1345  BP: 133/67  Pulse: 92  Temp:   Resp: 16    Post-op Vital Signs: stable   Complications: No apparent anesthesia complications

## 2014-12-19 NOTE — Plan of Care (Signed)
Problem: Consults Goal: Diagnosis- Total Joint Replacement Outcome: Completed/Met Date Met:  12/19/14 Primary Total Knee RIGHT

## 2014-12-19 NOTE — Anesthesia Preprocedure Evaluation (Addendum)
Anesthesia Evaluation  Patient identified by MRN, date of birth, ID band Patient awake    Reviewed: Allergy & Precautions, H&P , NPO status , Patient's Chart, lab work & pertinent test results  Airway Mallampati: II  TM Distance: >3 FB Neck ROM: full    Dental no notable dental hx.    Pulmonary neg pulmonary ROS,  breath sounds clear to auscultation  Pulmonary exam normal       Cardiovascular Exercise Tolerance: Good hypertension, On Medications Rhythm:regular Rate:Normal     Neuro/Psych negative neurological ROS  negative psych ROS   GI/Hepatic negative GI ROS, Neg liver ROS,   Endo/Other  diabetes, Type 2, Insulin Dependent  Renal/GU negative Renal ROS  negative genitourinary   Musculoskeletal negative musculoskeletal ROS (+)   Abdominal   Peds negative pediatric ROS (+)  Hematology negative hematology ROS (+)   Anesthesia Other Findings   Reproductive/Obstetrics negative OB ROS                            Anesthesia Physical  Anesthesia Plan  ASA: II  Anesthesia Plan: Spinal   Post-op Pain Management:    Induction:   Airway Management Planned: Simple Face Mask  Additional Equipment:   Intra-op Plan:   Post-operative Plan:   Informed Consent: I have reviewed the patients History and Physical, chart, labs and discussed the procedure including the risks, benefits and alternatives for the proposed anesthesia with the patient or authorized representative who has indicated his/her understanding and acceptance.   Dental Advisory Given  Plan Discussed with: CRNA  Anesthesia Plan Comments: ( )        Anesthesia Quick Evaluation

## 2014-12-19 NOTE — Anesthesia Procedure Notes (Signed)
Spinal Patient location during procedure: OR Start time: 12/19/2014 7:25 AM End time: 12/19/2014 7:30 AM Staffing Resident/CRNA: Kaitlyn Franko Performed by: resident/CRNA  Preanesthetic Checklist Completed: patient identified, site marked, surgical consent, pre-op evaluation, timeout performed, IV checked, risks and benefits discussed and monitors and equipment checked Spinal Block Patient position: sitting Prep: Betadine Patient monitoring: heart rate, cardiac monitor, continuous pulse ox and blood pressure Approach: midline Location: L3-4 Injection technique: single-shot Needle Needle type: Spinocan  Needle gauge: 24 G Needle length: 10 cm Needle insertion depth: 8 cm Assessment Sensory level: T6 Additional Notes Lot  [id number] Exp  2017-10

## 2014-12-19 NOTE — Transfer of Care (Signed)
Immediate Anesthesia Transfer of Care Note  Patient: Christopher MightyJohn B Rasmussen  Procedure(s) Performed: Procedure(s): RIGHT TOTAL KNEE ARTHROPLASTY (Right) CLOSED MANIPULATION  LEFT KNEE (Left)  Patient Location: PACU  Anesthesia Type:Spinal  Level of Consciousness: awake, alert  and oriented  Airway & Oxygen Therapy: Patient Spontanous Breathing and Patient connected to face mask oxygen  Post-op Assessment: Report given to RN  Post vital signs: Reviewed and stable  Last Vitals:  Filed Vitals:   12/19/14 0930  BP:   Pulse: 93  Temp:   Resp: 15    Complications: No apparent anesthesia complications

## 2014-12-19 NOTE — Plan of Care (Signed)
Problem: Consults Goal: Diagnosis- Total Joint Replacement Primary Total Knee     

## 2014-12-19 NOTE — Evaluation (Signed)
Physical Therapy Evaluation Patient Details Name: Christopher MightyJohn B Rasmussen MRN: [id number] DOB: 01/22/1946 Today's Date: 12/19/2014   History of Present Illness  R TKR; L TKR manipulation (L TKR 1/16)  Clinical Impression  Pt s/p R TKR presents with decreased Bil LE strength/ROM and post op pain limiting functional mobility.  Pt should progress to dc home with family assist and HHPT follow up.    Follow Up Recommendations Home health PT    Equipment Recommendations  None recommended by PT    Recommendations for Other Services OT consult     Precautions / Restrictions Precautions Precautions: Knee;Fall Required Braces or Orthoses: Knee Immobilizer - Right Knee Immobilizer - Right: Discontinue once straight leg raise with < 10 degree lag Restrictions Weight Bearing Restrictions: No Other Position/Activity Restrictions: wbat      Mobility  Bed Mobility Overal bed mobility: Needs Assistance;+2 for physical assistance Bed Mobility: Supine to Sit     Supine to sit: Mod assist;+2 for physical assistance;+2 for safety/equipment     General bed mobility comments: cues for sequence and use of L LE to self assist.  Physical assist to manage LEs and to bring trunk to upright  Transfers                 General transfer comment: NT - Pt reports lightheaded with move to sitting - "I think I just want to sit here today and not stand until tomorrow".  Pt sitting at EOB x 10+ minutes with min guard/Sup  Ambulation/Gait                Stairs            Wheelchair Mobility    Modified Rankin (Stroke Patients Only)       Balance                                             Pertinent Vitals/Pain Pain Assessment: 0-10 Pain Score: 4  Pain Location: R knee Pain Descriptors / Indicators: Sore Pain Intervention(s): Limited activity within patient's tolerance;Monitored during session;Premedicated before session;Ice applied    Home Living  Family/patient expects to be discharged to:: Private residence Living Arrangements: Spouse/significant other Available Help at Discharge: Family Type of Home: House Home Access: Stairs to enter Entrance Stairs-Rails: Doctor, general practiceight;Left Entrance Stairs-Number of Steps: 4 Home Layout: One level;Able to live on main level with bedroom/bathroom Home Equipment: Dan HumphreysWalker - 2 wheels      Prior Function Level of Independence: Independent               Hand Dominance   Dominant Hand: Right    Extremity/Trunk Assessment   Upper Extremity Assessment: Overall WFL for tasks assessed           Lower Extremity Assessment: RLE deficits/detail;LLE deficits/detail      Cervical / Trunk Assessment: Normal  Communication   Communication: No difficulties  Cognition Arousal/Alertness: Awake/alert Behavior During Therapy: WFL for tasks assessed/performed Overall Cognitive Status: Within Functional Limits for tasks assessed                      General Comments      Exercises Total Joint Exercises Ankle Circles/Pumps: AROM;Both;15 reps;Supine      Assessment/Plan    PT Assessment Patient needs continued PT services  PT Diagnosis Difficulty walking   PT Problem List Decreased strength;Decreased range  of motion;Decreased activity tolerance;Decreased mobility;Decreased knowledge of use of DME;Pain  PT Treatment Interventions DME instruction;Gait training;Stair training;Functional mobility training;Therapeutic exercise;Therapeutic activities;Patient/family education   PT Goals (Current goals can be found in the Care Plan section) Acute Rehab PT Goals Patient Stated Goal: Walk without pain PT Goal Formulation: With patient Time For Goal Achievement: 12/24/14 Potential to Achieve Goals: Good    Frequency 7X/week   Barriers to discharge        Co-evaluation               End of Session Equipment Utilized During Treatment: Right knee immobilizer Activity Tolerance:  Patient limited by fatigue;Other (comment) (lightheaded with sitting) Patient left: in bed;with call bell/phone within reach Nurse Communication: Mobility status         Time: [id number] PT Time Calculation (min) (ACUTE ONLY): 27 min   Charges:   PT Evaluation $Initial PT Evaluation Tier I: 1 Procedure PT Treatments $Therapeutic Activity: 8-22 mins   PT G Codes:        Christopher Rasmussen 12/19/2014, 4:27 PM

## 2014-12-19 NOTE — H&P (Signed)
TOTAL KNEE ADMISSION H&P  Patient is being admitted for right total knee arthroplasty. His also has post-op arthrofibrosis of his left total knee replacement.  Subjective:  Chief Complaint:right knee pain.  HPI: Christopher Rasmussen, 69 y.o. male, has a history of pain and functional disability in the right knee due to arthritis and has failed non-surgical conservative treatments for greater than 12 weeks to includeNSAID's and/or analgesics, corticosteriod injections, flexibility and strengthening excercises, supervised PT with diminished ADL's post treatment, use of assistive devices and activity modification.  Onset of symptoms was gradual, starting 2 years ago with gradually worsening course since that time. The patient noted no past surgery on the right knee(s).  Patient currently rates pain in the right knee(s) at 9 out of 10 with activity. Patient has night pain, worsening of pain with activity and weight bearing, pain that interferes with activities of daily living, pain with passive range of motion, crepitus and joint swelling.  Patient has evidence of subchondral cysts, subchondral sclerosis, periarticular osteophytes, joint space narrowing and significant varus by imaging studies. There is no active infection.  Patient Active Problem List   Diagnosis Date Noted  . Osteoarthritis of right knee 12/19/2014  . Arthrofibrosis of total knee arthroplasty, left 10/17/2014  . Ileus, postoperative   . Acute renal failure with tubular necrosis   . Acute renal failure 08/23/2014  . Abdominal distension 08/23/2014  . Hyperkalemia 08/23/2014  . Arthritis of right knee 08/22/2014  . Status post total left knee replacement 08/22/2014  . Chest pain 02/22/2013  . HTN (hypertension) 02/22/2013  . HLD (hyperlipidemia) 02/22/2013  . Diabetes 02/22/2013   Past Medical History  Diagnosis Date  . Hypertension   . High cholesterol     under control  . Type II diabetes mellitus   . Arthritis     Past  Surgical History  Procedure Laterality Date  . Knee arthroscopy Left 1990's  . Total knee arthroplasty Left 08/22/2014    Procedure: LEFT TOTAL KNEE ARTHROPLASTY;  Surgeon: Kathryne Hitchhristopher Y Blackman, MD;  Location: [facility];  Service: Orthopedics;  Laterality: Left;  . Knee closed reduction Left 10/17/2014    Procedure: CLOSED MANIPULATION LEFT KNEE UNDER ANESTHESIA;  Surgeon: Kathryne Hitchhristopher Y Blackman, MD;  Location: [facility];  Service: Orthopedics;  Laterality: Left;    Prescriptions prior to admission  Medication Sig Dispense Refill Last Dose  . amLODipine (NORVASC) 10 MG tablet Take 10 mg by mouth every morning.    12/19/2014 at 0350  . aspirin EC 325 MG tablet Take 1 tablet (325 mg total) by mouth 2 (two) times daily after a meal. (Patient taking differently: Take 325 mg by mouth daily. ) 30 tablet 0 12/14/2014 at Unknown time  . Dulaglutide 0.75 MG/0.5ML SOPN Inject 0.75 mg into the skin once a week.   12/12/2014  . famotidine (PEPCID) 20 MG tablet Take 1 tablet (20 mg total) by mouth daily. (Patient taking differently: Take 20 mg by mouth daily as needed for heartburn. ) 30 tablet 1 Past Month at Unknown time  . hydrochlorothiazide (MICROZIDE) 12.5 MG capsule Take 12.5 mg by mouth every morning.    10/13/2014  . insulin aspart (NOVOLOG) 100 UNIT/ML injection Inject 10 Units into the skin daily with lunch.   12/18/2014 at 1200  . insulin aspart protamine- aspart (NOVOLOG MIX 70/30) (70-30) 100 UNIT/ML injection Inject 30 Units into the skin every morning.   12/18/2014 at 0800  . metFORMIN (GLUCOPHAGE) 1000 MG tablet Take 1,000 mg by mouth 2 (  two) times daily with a meal.   12/18/2014 at 2000  . methocarbamol (ROBAXIN) 500 MG tablet Take 1 tablet (500 mg total) by mouth every 6 (six) hours as needed for muscle spasms. 60 tablet 0 Past Month at Unknown time  . metoprolol succinate (TOPROL-XL) 50 MG 24 hr tablet Take 50 mg by mouth at bedtime. Take with or immediately following a meal.   12/18/2014 at 2000  .  ramipril (ALTACE) 10 MG tablet Take 1 tablet (10 mg total) by mouth 2 (two) times daily. (Patient taking differently: Take 10 mg by mouth every morning. ) 90 tablet 0 10/13/2014  . simvastatin (ZOCOR) 10 MG tablet Take 10 mg by mouth at bedtime.   12/18/2014 at pm  . valsartan-hydrochlorothiazide (DIOVAN-HCT) 320-12.5 MG per tablet Take 1 tablet by mouth daily.   12/18/2014 at am  . oxyCODONE (OXY IR/ROXICODONE) 5 MG immediate release tablet Take 1 tablet (5 mg total) by mouth every 8 (eight) hours as needed for severe pain. 40 tablet 0 More than a month at Unknown time   No Known Allergies  History  Substance Use Topics  . Smoking status: Never Smoker   . Smokeless tobacco: Never Used  . Alcohol Use: No    Family History  Problem Relation Age of Onset  . CAD Father   . Stroke Mother      Review of Systems  Musculoskeletal: Positive for joint pain.  All other systems reviewed and are negative.   Objective:  Physical Exam  Constitutional: He is oriented to person, place, and time. He appears well-developed and well-nourished.  HENT:  Head: Normocephalic and atraumatic.  Eyes: EOM are normal. Pupils are equal, round, and reactive to light.  Neck: Normal range of motion. Neck supple.  Cardiovascular: Normal rate and regular rhythm.   Respiratory: Effort normal and breath sounds normal.  GI: Soft. Bowel sounds are normal.  Musculoskeletal:       Right knee: He exhibits decreased range of motion, swelling, effusion and abnormal alignment. Tenderness found. Medial joint line and lateral joint line tenderness noted.       Left knee: He exhibits decreased range of motion.  Neurological: He is alert and oriented to person, place, and time.  Skin: Skin is warm and dry.  Psychiatric: He has a normal mood and affect.    Vital signs in last 24 hours: Temp:  [98.5 F (36.9 C)] 98.5 F (36.9 C) (05/13 0535) Pulse Rate:  [79] 79 (05/13 0535) Resp:  [18] 18 (05/13 0535) BP: (147)/(66)  147/66 mmHg (05/13 0535) SpO2:  [100 %] 100 % (05/13 0535) Weight:  [102.967 kg (227 lb)] 102.967 kg (227 lb) (05/13 0554)  Labs:   Estimated body mass index is 33.51 kg/(m^2) as calculated from the following:   Height as of this encounter:  (1.753 m).   Weight as of this encounter: 102.967 kg (227 lb).   Imaging Review Plain radiographs demonstrate severe degenerative joint disease of the right knee(s). The overall alignment ismild varus. The bone quality appears to be good for age and reported activity level.  Assessment/Plan:  End stage arthritis, right knee and post-op arthrofibrosis of his left knee  The patient history, physical examination, clinical judgment of the provider and imaging studies are consistent with end stage degenerative joint disease of the right knee(s) and total knee arthroplasty is deemed medically necessary. We will also manipulate his left knee while under anesthesia.  The treatment options including medical management, injection therapy arthroscopy  and arthroplasty were discussed at length. The risks and benefits of total knee arthroplasty were presented and reviewed. The risks due to aseptic loosening, infection, stiffness, patella tracking problems, thromboembolic complications and other imponderables were discussed. The patient acknowledged the explanation, agreed to proceed with the plan and consent was signed. Patient is being admitted for inpatient treatment for surgery, pain control, PT, OT, prophylactic antibiotics, VTE prophylaxis, progressive ambulation and ADL's and discharge planning. The patient is planning to be discharged home with home health services

## 2014-12-19 NOTE — Brief Op Note (Signed)
12/19/2014  9:14 AM  PATIENT:  Christopher Rasmussen  69 y.o. male  PRE-OPERATIVE DIAGNOSIS:  Right knee primary arthritis, arthrofibrosis left knee  POST-OPERATIVE DIAGNOSIS:  Right knee primary arthritis, arthrofibrosis left knee  PROCEDURE:  Procedure(s): RIGHT TOTAL KNEE ARTHROPLASTY (Right) CLOSED MANIPULATION  LEFT KNEE (Left)  SURGEON:  Surgeon(s) and Role:    * Kathryne Hitchhristopher Y Astrid Vides, MD - Primary  PHYSICIAN ASSISTANT: Rexene EdisonGil Clark, PA-C  ANESTHESIA:   local and spinal  EBL:  Total I/O In: 1500 [I.V.:1500] Out: 250 [Urine:200; Blood:50]  BLOOD ADMINISTERED:none  DRAINS: none   LOCAL MEDICATIONS USED:  OTHER Experil  SPECIMEN:  No Specimen  DISPOSITION OF SPECIMEN:  N/A  COUNTS:  YES  TOURNIQUET:   Total Tourniquet Time Documented: Calf (Right) - 50 minutes Total: Calf (Right) - 50 minutes   DICTATION: .Other Dictation: Dictation Number [id number]  PLAN OF CARE: Admit to inpatient   PATIENT DISPOSITION:  PACU - hemodynamically stable.   Delay start of Pharmacological VTE agent (>24hrs) due to surgical blood loss or risk of bleeding: no

## 2014-12-20 LAB — GLUCOSE, CAPILLARY
GLUCOSE-CAPILLARY: 192 mg/dL — AB (ref 65–99)
GLUCOSE-CAPILLARY: 127 mg/dL — AB (ref 65–99)
GLUCOSE-CAPILLARY: 117 mg/dL — AB (ref 65–99)
Glucose-Capillary: 192 mg/dL — ABNORMAL HIGH (ref 65–99)

## 2014-12-20 LAB — CBC
HEMATOCRIT: 35.9 % — AB (ref 39.0–52.0)
Hemoglobin: 12.5 g/dL — ABNORMAL LOW (ref 13.0–17.0)
MCH: 28.7 pg (ref 26.0–34.0)
MCHC: 34.8 g/dL (ref 30.0–36.0)
MCV: 82.5 fL (ref 78.0–100.0)
Platelets: 203 10*3/uL (ref 150–400)
RBC: 4.35 MIL/uL (ref 4.22–5.81)
RDW: 15.3 % (ref 11.5–15.5)
WBC: 11.3 10*3/uL — AB (ref 4.0–10.5)

## 2014-12-20 LAB — BASIC METABOLIC PANEL
Anion gap: 5 (ref 5–15)
BUN: 22 mg/dL — ABNORMAL HIGH (ref 6–20)
CHLORIDE: 103 mmol/L (ref 101–111)
CO2: 27 mmol/L (ref 22–32)
Calcium: 8.4 mg/dL — ABNORMAL LOW (ref 8.9–10.3)
Creatinine, Ser: 0.75 mg/dL (ref 0.61–1.24)
GFR calc Af Amer: >60 mL/min (ref >60)
GFR calc non Af Amer: >60 mL/min (ref >60)
Glucose, Bld: 184 mg/dL — ABNORMAL HIGH (ref 65–99)
Potassium: 4.2 mmol/L (ref 3.5–5.1)
Sodium: 135 mmol/L (ref 135–145)

## 2014-12-20 MED ORDER — SODIUM CHLORIDE 0.9 % IV BOLUS (SEPSIS)
500.0000 mL | Freq: Once | INTRAVENOUS | Status: AC
  Administered 2014-12-20: 500 mL via INTRAVENOUS

## 2014-12-20 NOTE — Progress Notes (Signed)
Pt not yet voided, bladder scanned for less than 100cc. Dr Magnus IvanBlackman notified & orders received. Julion Gatt, [employer] Bath [employer] Beyond[employer]

## 2014-12-20 NOTE — Op Note (Signed)
NAMMarland Kitchen:  [name], Kervens                 ACCOUNT NO.:  [id number]  MEDICAL RECORD NO.:  [id number]  LOCATION:  1614                         [facility]  PHYSICIAN:  Vanita PandaChristopher Y. Magnus IvanBlackman, M.D.DATE OF BIRTH:  22-Jun-1946  DATE OF PROCEDURE:  12/19/2014 DATE OF DISCHARGE:                              OPERATIVE REPORT   PREOPERATIVE DIAGNOSES: 1. Primary osteoarthritis and degenerative joint disease, right knee. 2. Postoperative arthrofibrosis, left total knee arthroplasty.  POSTOPERATIVE DIAGNOSES: 1. Primary osteoarthritis and degenerative joint disease, right knee. 2. Postoperative arthrofibrosis, left total knee arthroplasty.  PROCEDURE: 1. Right total knee arthroplasty. 2. Manipulation of left knee under anesthesia.  IMPLANTS:  Stryker Triathlon knee with size 4 femur, size 4 tibial tray, 9 mm polyethylene insert, size 32 patellar button.  SURGEON:  Vanita PandaChristopher Y. Magnus IvanBlackman, MD  ASSISTANT:  Richardean CanalGilbert Clark, PA-C  ANESTHESIA:  Spinal.  ANTIBIOTICS:  2 g IV Ancef.  BLOOD LOSS:  50 mL.  TOURNIQUET TIME:  Less than 1 hour.  COMPLICATIONS:  None.  INDICATIONS:  [name] is a very pleasant 69 year old gentleman who has a history of a primary osteoarthritis involving his right knee with a significant varus deformity.  He also has undergone a left total knee arthroplasty back in January.  That knee has some postoperative arthrofibrosis, pleased with his motion, but I felt like we could get even some more motion with a manipulation of that knee.  Today, we got him scheduled for right total knee arthroplasty and since he is going to be under spinal anesthesia, we talked about manipulating his left knee. He does wish to proceed with that to see if we can get more improved motion on that.  The risks and benefits have been explained to him in detail and given his successful previous surgery in terms of improving his ability, quality of life and pain control, he does wish to  proceed with this on the right knee.  He understands the risk of acute blood loss anemia, nerve and vessel injury, fracture, infection, and DVT.  He understands the goals are decreased pain, improved mobility, and overall improved quality of life.  PROCEDURE DESCRIPTION:  After informed consent was obtained, appropriate right knee was marked for total knee arthroplasty and the left knee was marked for a manipulation.  He was brought to the operating room, and placed on the operating table.  He was setup first and spinal anesthesia was obtained.  He was then laid in a supine position.  A Foley catheter was placed.  We decided to do time-out's, first we did a time-out for the left knee manipulation.  Time-out was called to identify correct patient, correct left knee for manipulation.  I then slowly manipulated the knee and was able to get him to 120 degrees of flexion and full extension.  We then finished that portion of the case and went to his right knee.  We placed a nonsterile tourniquet, was wrapped around the right upper thigh.  His right leg was prepped and draped with DuraPrep and sterile drapes and a sterile stockinette.  A time-out was called, he was identified as correct patient and the correct knee for right knee, total  knee arthroplasty.  We then used Esmarch to wrap up the leg and tourniquet was inflated to 300 mm of pressure.  I then made a direct midline incision over the patella and carried this proximally and distally.  We dissected down the knee joint and performed a medial parapatellar arthrotomy, finding a large joint effusion, significant varus deformity, cartilage loss throughout the knee and synovitis as well.  We cleaned the knee of debris including remnants of the medial and lateral meniscus.  ACL, PCL and synovitis from the knee and as well as periarticular osteophytes with the knee in a flexed position using the extramedullary cutting guide for the tibia.  We  set for neutral slope correcting for varus and valgus and taking 9 mm off the high side. We made this cut without difficulty.  We then used the intramedullary guide through the notch for the distal femoral resection.  We set this for 10 mm resection given his flexion contracture setting for 5 degrees external rotation for a right knee.  We then made this cut without difficulty.  We brought the knee back down into extension and placed a 9 mm extension block and I felt like we got him out of his varus and I got him to full extension.  We went back to the femur and used the epicondylar axis with a femoral sizing guide.  We then chose a size 4 femur with the 4-in-1 cutting block on the femur and made our anterior posterior cuts followed by our chamfer cuts.  We then made our femoral box cut and went to the tibia and set the rotation off the femoral femur as well as the tibial tubercle and chose a size 4 tibia.  We then made our keel punch for this.  With the trial tibia in place and the trial femur in place, we trialed a 9 mm polyethylene insert.  I was pleased with the motion and stability.  We then made our patellar cut and drilled 3 holes for size 32 patellar button.  We then removed all instrumentation from the knee and irrigated the knee with normal saline solution using pulsatile lavage.  We then inserted a mixture of Exparel around the knee joint itself.  We then mixed our cement and with the knee in a flexed position, cemented our Stryker Triathlon tibial tray size 4.  We cemented the femur, placed the real 9 mm fix bearing polyethylene insert and cemented the patellar button.  Once the cement had hardened and dried, we removed cement debris from the knee.  We then irrigated the knee again with normal saline solution using pulsatile lavage.  The tourniquet was let down.  Hemostasis was obtained with electrocautery.  I then closed the arthrotomy with interrupted #1 Vicryl suture  followed by 0 Vicryl in the deep tissue, 2-0 Vicryl in subcutaneous tissue, 4-0 Monocryl subcuticular stitch and well-padded sterile dressing on the skin.  He was then taken to recovery room in stable condition.  All final counts were correct.  There were no complications noted.  Of note, Richardean CanalGilbert Clark, assisted in the entire case and his assistance was crucial for facilitating all aspects of this case.     Vanita Pandahristopher Y. Magnus IvanBlackman, M.D.     CYB/MEDQ  D:  12/19/2014  T:  12/20/2014  Job:  [id number]

## 2014-12-20 NOTE — Progress Notes (Signed)
Physical Therapy Treatment Patient Details Name: Christopher MightyJohn B Rasmussen MRN: [id number] DOB: November 08, 1945 Today's Date: 12/20/2014    History of Present Illness R TKR; L TKR manipulation (L TKR 1/16)    PT Comments    Progressed to ambulating short distance but ltd by c/o dizziness - BP 133/60, HR 99, SaO2 90%  Follow Up Recommendations  Home health PT     Equipment Recommendations  None recommended by PT    Recommendations for Other Services OT consult     Precautions / Restrictions Precautions Precautions: Knee;Fall Required Braces or Orthoses: Knee Immobilizer - Right Knee Immobilizer - Right: Discontinue once straight leg raise with < 10 degree lag Restrictions Weight Bearing Restrictions: No Other Position/Activity Restrictions: wbat    Mobility  Bed Mobility Overal bed mobility: Needs Assistance Bed Mobility: Supine to Sit     Supine to sit: Mod assist     General bed mobility comments: cues for sequence and use of L LE to self assist.  Physical assist to manage LEs and to bring trunk to upright  Transfers Overall transfer level: Needs assistance Equipment used: Rolling walker (2 wheeled) Transfers: Sit to/from Stand Sit to Stand: From elevated surface;+2 physical assistance;Mod assist         General transfer comment: cues for LE management and use of UEs to self assist  Ambulation/Gait Ambulation/Gait assistance: Min assist;Mod assist;+2 physical assistance Ambulation Distance (Feet): 10 Feet Assistive device: Rolling walker (2 wheeled) Gait Pattern/deviations: Step-to pattern;Decreased step length - right;Decreased step length - left;Shuffle;Trunk flexed Gait velocity: decr   General Gait Details: cues for sequence, posture and position from [employer]   Stairs            Wheelchair Mobility    Modified Rankin (Stroke Patients Only)       Balance                                    Cognition Arousal/Alertness: Awake/alert Behavior  During Therapy: WFL for tasks assessed/performed Overall Cognitive Status: Within Functional Limits for tasks assessed                      Exercises Total Joint Exercises Ankle Circles/Pumps: AROM;Both;15 reps;Supine Quad Sets: AROM;Both;10 reps;Supine Heel Slides: AAROM;Both;10 reps;Supine Straight Leg Raises: AAROM;AROM;Both;10 reps;Supine Goniometric ROM: AAROM R knee -10-  35; AAROM L knee -10- 90    General Comments        Pertinent Vitals/Pain Pain Assessment: 0-10 Pain Score: 5  Pain Location: R knee Pain Descriptors / Indicators: Aching;Sore Pain Intervention(s): Limited activity within patient's tolerance;Monitored during session;Premedicated before session;Ice applied    Home Living                      Prior Function            PT Goals (current goals can now be found in the care plan section) Acute Rehab PT Goals Patient Stated Goal: Walk without pain PT Goal Formulation: With patient Time For Goal Achievement: 12/24/14 Potential to Achieve Goals: Good Progress towards PT goals: Progressing toward goals    Frequency  7X/week    PT Plan Current plan remains appropriate    Co-evaluation             End of Session Equipment Utilized During Treatment: Right knee immobilizer Activity Tolerance: Patient tolerated treatment well Patient left: in chair;with call bell/phone within  reach;with family/visitor present     Time: 0821-0859 PT Time Calculation (min) (ACUTE ONLY): 38 min  Charges:  $Gait Training: 8-22 mins $Therapeutic Exercise: 23-37 mins                    G Codes:      Christopher Rasmussen 12/20/2014, 9:15 AM

## 2014-12-20 NOTE — Care Management Note (Signed)
Case Management Note  Patient Details  Name: Youlanda MightyJohn B Strub MRN: [id number] Date of Birth: 09/27/1945  Subjective/Objective:                    Action/Plan:   Expected Discharge Date:  12/21/14               Expected Discharge Plan:  Home w Home Health Services  In-House Referral:     Discharge planning Services  CM Consult  Post Acute Care Choice:  Home Health Choice offered to:  Patient  DME Arranged:    DME Agency:     HH Arranged:  PT [facility]  Status of Service:  Completed, signed off  Medicare Important Message Given:  N/A - LOS <3 / Initial given by admissions Date Medicare IM Given:    Medicare IM give by:    Date Additional Medicare IM Given:    Additional Medicare Important Message give by:     If discussed at Long Length of Stay Meetings, dates discussed:    Additional Comments: NCM spoke to pt and has DME at home from previous surgeries. [facility]  Elliot CousinShavis, Emmerson Shuffield Ellen, RN 12/20/2014, 3:35 PM

## 2014-12-20 NOTE — Progress Notes (Signed)
Physical Therapy Treatment Patient Details Name: Christopher Rasmussen MRN: [id number] DOB: 1945/10/29 Today's Date: 12/20/2014    History of Present Illness R TKR; L TKR manipulation (L TKR 1/16)    PT Comments    Pt progressing slowly - ltd by mild dizziness with mobility and ltd flex on non-op knee for transfers.  Follow Up Recommendations  Home health PT     Equipment Recommendations  None recommended by PT    Recommendations for Other Services OT consult     Precautions / Restrictions Precautions Precautions: Knee;Fall Required Braces or Orthoses: Knee Immobilizer - Right Knee Immobilizer - Right: Discontinue once straight leg raise with < 10 degree lag Restrictions Weight Bearing Restrictions: No Other Position/Activity Restrictions: wbat    Mobility  Bed Mobility Overal bed mobility: Needs Assistance Bed Mobility: Supine to Sit;Sit to Supine     Supine to sit: Mod assist Sit to supine: Mod assist   General bed mobility comments: cues for sequence and use of L LE to self assist.  Physical assist to manage LEs and to bring trunk to upright  Transfers Overall transfer level: Needs assistance Equipment used: Rolling walker (2 wheeled) Transfers: Sit to/from Stand Sit to Stand: From elevated surface;+2 physical assistance;Mod assist         General transfer comment: cues for LE management and use of UEs to self assist  Ambulation/Gait Ambulation/Gait assistance: Min assist;Mod assist;+2 safety/equipment Ambulation Distance (Feet): 38 Feet Assistive device: Rolling walker (2 wheeled) Gait Pattern/deviations: Step-to pattern;Decreased step length - right;Decreased step length - left;Shuffle;Trunk flexed Gait velocity: decr   General Gait Details: cues for sequence, posture and position from [employer]   Stairs            Wheelchair Mobility    Modified Rankin (Stroke Patients Only)       Balance                                     Cognition Arousal/Alertness: Awake/alert Behavior During Therapy: WFL for tasks assessed/performed Overall Cognitive Status: Within Functional Limits for tasks assessed                      Exercises      General Comments        Pertinent Vitals/Pain Pain Assessment: 0-10 Pain Score: 4  Pain Location: R knee Pain Descriptors / Indicators: Aching;Sore Pain Intervention(s): Limited activity within patient's tolerance;Monitored during session;Premedicated before session;Ice applied    Home Living                      Prior Function            PT Goals (current goals can now be found in the care plan section) Acute Rehab PT Goals Patient Stated Goal: Walk without pain PT Goal Formulation: With patient Time For Goal Achievement: 12/24/14 Potential to Achieve Goals: Good Progress towards PT goals: Progressing toward goals    Frequency  7X/week    PT Plan Current plan remains appropriate    Co-evaluation             End of Session Equipment Utilized During Treatment: Right knee immobilizer;Gait belt Activity Tolerance: Patient tolerated treatment well Patient left: with call bell/phone within reach;in bed;with family/visitor present     Time: [id number] PT Time Calculation (min) (ACUTE ONLY): 33 min  Charges:  $Gait Training: 23-37 mins  G Codes:      Christopher Rasmussen 12/20/2014, 2:42 PM

## 2014-12-20 NOTE — Progress Notes (Signed)
OT NOTE Spoke with PT regarding pts performance this day . Will initiate OT eval next day and address ADL activity as pts mobility improved. EarlysvilleLori Cyril Wood[place], [place] [phone]

## 2014-12-20 NOTE — Plan of Care (Signed)
Problem: Phase III Progression Outcomes Goal: Anticoagulant follow-up in place Outcome: Not Applicable Date Met:  17/35/67 asa

## 2014-12-21 LAB — CBC
HCT: 32.3 % — ABNORMAL LOW (ref 39.0–52.0)
Hemoglobin: 10.6 g/dL — ABNORMAL LOW (ref 13.0–17.0)
MCH: 27.5 pg (ref 26.0–34.0)
MCHC: 32.8 g/dL (ref 30.0–36.0)
MCV: 83.9 fL (ref 78.0–100.0)
PLATELETS: 170 10*3/uL (ref 150–400)
RBC: 3.85 MIL/uL — ABNORMAL LOW (ref 4.22–5.81)
RDW: 15.5 % (ref 11.5–15.5)
WBC: 11.4 10*3/uL — ABNORMAL HIGH (ref 4.0–10.5)

## 2014-12-21 LAB — BASIC METABOLIC PANEL
Anion gap: 8 (ref 5–15)
BUN: 37 mg/dL — ABNORMAL HIGH (ref 6–20)
CALCIUM: 8.8 mg/dL — AB (ref 8.9–10.3)
CO2: 26 mmol/L (ref 22–32)
Chloride: 99 mmol/L — ABNORMAL LOW (ref 101–111)
Creatinine, Ser: 1.27 mg/dL — ABNORMAL HIGH (ref 0.61–1.24)
GFR calc non Af Amer: 57 mL/min — ABNORMAL LOW (ref >60)
GLUCOSE: 177 mg/dL — AB (ref 65–99)
POTASSIUM: 4.5 mmol/L (ref 3.5–5.1)
SODIUM: 133 mmol/L — AB (ref 135–145)

## 2014-12-21 LAB — GLUCOSE, CAPILLARY
GLUCOSE-CAPILLARY: 149 mg/dL — AB (ref 65–99)
Glucose-Capillary: 90 mg/dL (ref 65–99)
Glucose-Capillary: 186 mg/dL — ABNORMAL HIGH (ref 65–99)
Glucose-Capillary: 80 mg/dL (ref 65–99)
Glucose-Capillary: 159 mg/dL — ABNORMAL HIGH (ref 65–99)

## 2014-12-21 LAB — PREPARE RBC (CROSSMATCH)

## 2014-12-21 MED ORDER — TAMSULOSIN HCL 0.4 MG PO CAPS
0.4000 mg | ORAL_CAPSULE | Freq: Every day | ORAL | Status: DC
  Administered 2014-12-21 – 2014-12-22 (×2): 0.4 mg via ORAL
  Filled 2014-12-21 (×2): qty 1

## 2014-12-21 MED ORDER — SODIUM CHLORIDE 0.9 % IV SOLN
Freq: Once | INTRAVENOUS | Status: DC
Start: 2014-12-21 — End: 2014-12-22

## 2014-12-21 NOTE — Progress Notes (Signed)
Physical Therapy Treatment Patient Details Name: Christopher Rasmussen MRN: [id number] DOB: 1946-07-25 Today's Date: 12/21/2014    History of Present Illness R TKR; L TKR manipulation (L TKR 1/16)    PT Comments    Patient is moving very slowly and requires 2 persons for sit to stand. Plans DC tomorrow.  Follow Up Recommendations  Home health PT;Supervision/Assistance - 24 hour     Equipment Recommendations  None recommended by PT    Recommendations for Other Services       Precautions / Restrictions Precautions Precautions: Knee;Fall Required Braces or Orthoses: Knee Immobilizer - Right Knee Immobilizer - Right: Discontinue once straight leg raise with < 10 degree lag    Mobility  Bed Mobility           Sit to supine: Mod assist   General bed mobility comments: increased time needed  Transfers   Equipment used: Rolling walker (2 wheeled) Transfers: Sit to/from Stand Sit to Stand: From elevated surface;Mod assist;+2 safety/equipment         General transfer comment: cues for LE management and use of UEs to self assist,  lifting assist from low recliner  Ambulation/Gait Ambulation/Gait assistance: Mod assist;+2 safety/equipment Ambulation Distance (Feet): 25 Feet Assistive device: Rolling walker (2 wheeled) Gait Pattern/deviations: Step-to pattern Gait velocity: decr   General Gait Details: cues for sequence, posture and position from RW, very slow   Stairs            Wheelchair Mobility    Modified Rankin (Stroke Patients Only)       Balance                                    Cognition Arousal/Alertness: Awake/alert                          Exercises Total Joint Exercises Ankle Circles/Pumps: AROM;Both;15 reps;Supine Quad Sets: AROM;Both;10 reps;Supine Heel Slides: AAROM;Both;10 reps;Supine Hip ABduction/ADduction: AAROM;Both;10 reps;Supine Straight Leg Raises: AAROM;AROM;Both;10 reps;Supine Goniometric ROM: R=  10-40. L 10-90    General Comments        Pertinent Vitals/Pain Pain Score: 5  Pain Location: R knee Pain Descriptors / Indicators: Sore Pain Intervention(s): Monitored during session;Premedicated before session;Ice applied    Home Living                      Prior Function            PT Goals (current goals can now be found in the care plan section) Progress towards PT goals: Progressing toward goals    Frequency  7X/week    PT Plan Current plan remains appropriate    Co-evaluation             End of Session Equipment Utilized During Treatment: Right knee immobilizer;Gait belt Activity Tolerance: Patient tolerated treatment well Patient left: with call bell/phone within reach;in bed;with family/visitor present     Time: [id number] PT Time Calculation (min) (ACUTE ONLY): 41 min  Charges:  $Gait Training: 8-22 mins $Therapeutic Exercise: 23-37 mins                    G Codes:      [name], [name] 12/21/2014, 5:37 PM

## 2014-12-21 NOTE — Progress Notes (Signed)
Patient ID: Christopher Rasmussen, male   DOB: 1946-01-12, 69 y.o.   MRN: [id number] Has had low urine output and his creatinine did go up today.  We held his diuretic and ACE inhibitor and did not start them at all post-op given his increased creatinine following his last knee replacement.  He has not had any NSAID's either.  His H/H did drop, and although it is above 10, I feel that he does need a unit of blood (PRBCs) to help with his kidney function.  I have talked with he and his wife about this as well as nursing.  His incision looks good and his calf is soft.  We will transfuse one unit.

## 2014-12-21 NOTE — Evaluation (Signed)
Occupational Therapy Evaluation Patient Details Name: Christopher Rasmussen MRN: [id number] DOB: Oct 18, 1945 Today's Date: 12/21/2014    History of Present Illness R TKR; L TKR manipulation (L TKR 1/16)   Clinical Impression   Pt is s/p TKA resulting in the deficits listed below (see OT Problem List). Pt will benefit from skilled OT to increase their safety and independence with ADL and functional mobility for ADL to facilitate discharge to venue listed below.        Follow Up Recommendations  No OT follow up    Equipment Recommendations  None recommended by OT    Recommendations for Other Services       Precautions / Restrictions Precautions Precautions: Knee;Fall Required Braces or Orthoses: Knee Immobilizer - Right Knee Immobilizer - Right: Discontinue once straight leg raise with < 10 degree lag Restrictions Weight Bearing Restrictions: No Other Position/Activity Restrictions: wbat      Mobility Bed Mobility Overal bed mobility: Needs Assistance Bed Mobility: Supine to Sit;Sit to Supine     Supine to sit: Mod assist Sit to supine: Mod assist   General bed mobility comments: increased time needed  Transfers Overall transfer level: Needs assistance Equipment used: Rolling walker (2 wheeled) Transfers: Sit to/from BJ'sStand;Stand Pivot Transfers Sit to Stand: From elevated surface;Mod assist         General transfer comment: cues for LE management and use of UEs to self assist    Balance                                            ADL Overall ADL's : Needs assistance/impaired Eating/Feeding: Set up;Sitting   Grooming: Set up;Sitting   Upper Body Bathing: Set up   Lower Body Bathing: Maximal assistance   Upper Body Dressing : Sitting;Set up   Lower Body Dressing: Maximal assistance   Toilet Transfer: Moderate assistance;BSC;RW Toilet Transfer Details (indicate cue type and reason): increased time Toileting- ArchitectClothing Manipulation and  Hygiene: Moderate assistance;Sit to/from stand         General ADL Comments: wife will  A as needed               Pertinent Vitals/Pain Pain Score: 5  Pain Location: r knee Pain Descriptors / Indicators: Sore;Aching Pain Intervention(s): Monitored during session;Limited activity within patient's tolerance;Patient requesting pain meds-RN notified     Hand Dominance Right   Extremity/Trunk Assessment         Cervical / Trunk Assessment Cervical / Trunk Assessment: Normal   Communication Communication Communication: No difficulties   Cognition Arousal/Alertness: Awake/alert Behavior During Therapy: WFL for tasks assessed/performed Overall Cognitive Status: Within Functional Limits for tasks assessed                     General Comments   pt needs increased time and encouragement            Home Living Family/patient expects to be discharged to:: Private residence Living Arrangements: Spouse/significant other Available Help at Discharge: Family Type of Home: House Home Access: Stairs to enter Entergy CorporationEntrance Stairs-Number of Steps: 4 Entrance Stairs-Rails: Right;Left Home Layout: One level;Able to live on main level with bedroom/bathroom               Home Equipment: Walker - 2 wheels          Prior Functioning/Environment Level of Independence: Independent  OT Diagnosis: Generalized weakness;Acute pain   OT Problem List: Decreased strength;Decreased activity tolerance   OT Treatment/Interventions: Self-care/ADL training;Patient/family education;DME and/or AE instruction    OT Goals(Current goals can be found in the care plan section) Acute Rehab OT Goals Patient Stated Goal: Walk without pain  OT Frequency: Min 2X/week              End of Session    Activity Tolerance: Patient tolerated treatment well Patient left:  in chair with wife present   Time: 1040-1103 OT Time Calculation (min): 23 min Charges:  OT General  Charges $OT Visit: 1 Procedure OT Evaluation $Initial OT Evaluation Tier I: 1 Procedure OT Treatments $Self Care/Home Management : 8-22 mins G-Codes:    Christopher CrowEDDING, Christopher Rasmussen 12/21/2014, 11:07 AM

## 2014-12-21 NOTE — Progress Notes (Signed)
Pt unable to void in urinal after d/c of Foley Cath 1215 12/20/14. I/O cath x 2 with approximately  300 cc UOP each event @ 2225 and 0530. Pt and wife expressed concern d/t  last hospitalization  08/2014 when he had ARF and developed Ileus etc..Marland Kitchen.Marland Kitchen.Will share this with oncoming staff.

## 2014-12-21 NOTE — Progress Notes (Signed)
Pt transferred to 1540; no changes on assessment. Christopher Rasmussen, Christopher Rasmussen

## 2014-12-22 ENCOUNTER — Encounter (HOSPITAL_COMMUNITY): Payer: Self-pay | Admitting: Orthopaedic Surgery

## 2014-12-22 LAB — TYPE AND SCREEN
ABO/RH(D): B POS
Antibody Screen: NEGATIVE
Unit division: 0

## 2014-12-22 LAB — CBC
HCT: 32.9 % — ABNORMAL LOW (ref 39.0–52.0)
Hemoglobin: 11.5 g/dL — ABNORMAL LOW (ref 13.0–17.0)
MCH: 28.4 pg (ref 26.0–34.0)
MCHC: 35 g/dL (ref 30.0–36.0)
MCV: 81.2 fL (ref 78.0–100.0)
Platelets: 156 10*3/uL (ref 150–400)
RBC: 4.05 MIL/uL — ABNORMAL LOW (ref 4.22–5.81)
RDW: 14.7 % (ref 11.5–15.5)
WBC: 7.5 10*3/uL (ref 4.0–10.5)

## 2014-12-22 LAB — BASIC METABOLIC PANEL
ANION GAP: 12 (ref 5–15)
BUN: 13 mg/dL (ref 6–20)
CALCIUM: 8.5 mg/dL — AB (ref 8.9–10.3)
CO2: 23 mmol/L (ref 22–32)
CREATININE: 0.6 mg/dL — AB (ref 0.61–1.24)
Chloride: 103 mmol/L (ref 101–111)
GLUCOSE: 133 mg/dL — AB (ref 65–99)
Potassium: 3.8 mmol/L (ref 3.5–5.1)
Sodium: 138 mmol/L (ref 135–145)

## 2014-12-22 LAB — GLUCOSE, CAPILLARY
Glucose-Capillary: 163 mg/dL — ABNORMAL HIGH (ref 65–99)
Glucose-Capillary: 156 mg/dL — ABNORMAL HIGH (ref 65–99)

## 2014-12-22 MED ORDER — ASPIRIN 325 MG PO TBEC: 325.0000 mg | DELAYED_RELEASE_TABLET | Freq: Two times a day (BID) | ORAL | Status: DC

## 2014-12-22 MED ORDER — OXYCODONE-ACETAMINOPHEN 5-325 MG PO TABS: 1.0000 | ORAL_TABLET | ORAL | Status: DC | PRN

## 2014-12-22 MED ORDER — DOCUSATE SODIUM 100 MG PO CAPS: 100.0000 mg | ORAL_CAPSULE | Freq: Two times a day (BID) | ORAL | Status: DC

## 2014-12-22 MED ORDER — SENNA 8.6 MG PO TABS: 2.0000 | ORAL_TABLET | Freq: Every day | ORAL | Status: DC | PRN

## 2014-12-22 NOTE — Discharge Summary (Signed)
Patient ID: Christopher Rasmussen MRN: [id number] DOB/AGE: [birth year] 68 y.o.  Admit date: 12/19/2014 Discharge date: 12/22/2014  Admission Diagnoses:  Principal Problem:   Osteoarthritis of right knee Active Problems:   Status post total right knee replacement   Discharge Diagnoses:  Same  Past Medical History  Diagnosis Date  . Hypertension   . High cholesterol     under control  . Type II diabetes mellitus   . Arthritis     Surgeries: Procedure(s): RIGHT TOTAL KNEE ARTHROPLASTY CLOSED MANIPULATION  LEFT KNEE on 12/19/2014   Consultants:  PT/OT  Discharged Condition: Improved  Hospital Course: Christopher Rasmussen is an 69 y.o. male who was admitted 12/19/2014 for operative treatment ofOsteoarthritis of right knee. Patient has severe unremitting pain that affects sleep, daily activities, and work/hobbies. After pre-op clearance the patient was taken to the operating room on 12/19/2014 and underwent  Procedure(s):RIGHT TOTAL KNEE ARTHROPLASTY CLOSED MANIPULATION  LEFT KNEE.    Patient was given perioperative antibiotics: Anti-infectives    Start     Dose/Rate Route Frequency Ordered Stop   12/19/14 1400  ceFAZolin (ANCEF) IVPB 1 g/50 mL premix     1 g 100 mL/hr over 30 Minutes Intravenous Every 6 hours 12/19/14 1101 12/19/14 2130   12/19/14 1101  ceFAZolin (ANCEF) IVPB 2 g/50 mL premix  Status:  Discontinued     2 g 100 mL/hr over 30 Minutes Intravenous On call to O.R. 12/19/14 1101 12/19/14 1106      Required one unit of PRBC' s due to his kidney function. Patient was given sequential compression devices, early ambulation, and chemoprophylaxis to prevent DVT.  Patient benefited maximally from hospital stay and there were no complications.    Recent vital signs: Patient Vitals for the past 24 hrs:  BP Temp Temp src Pulse Resp SpO2  12/22/14 0538 (!) 158/71 mmHg 98.7 F (37.1 C) Oral 96 18 96 %  12/21/14 2132 - - - 92 - -  12/21/14 2113 (!) 189/74 mmHg 99.1 F (37.3 C) - (!)  110 18 96 %  12/21/14 1633 - - - (!) 110 - 95 %  12/21/14 1407 125/62 mmHg 99.3 F (37.4 C) Oral (!) 105 16 98 %  12/21/14 1145 (!) 131/54 mmHg 99.6 F (37.6 C) Oral (!) 103 16 92 %  12/21/14 1115 (!) 140/57 mmHg 98.5 F (36.9 C) Oral (!) 104 20 95 %     Recent laboratory studies:  Recent Labs  12/21/14 0456 12/22/14 0528  WBC 11.4* 7.5  HGB 10.6* 11.5*  HCT 32.3* 32.9*  PLT 170 156  NA 133* 138  K 4.5 3.8  CL 99* 103  CO2 26 23  BUN 37* 13  CREATININE 1.27* 0.60*  GLUCOSE 177* 133*  CALCIUM 8.8* 8.5*     Discharge Medications:     Medication List    STOP taking these medications        oxyCODONE 5 MG immediate release tablet  Commonly known as:  Oxy IR/ROXICODONE      TAKE these medications        amLODipine 10 MG tablet  Commonly known as:  NORVASC  Take 10 mg by mouth every morning.     aspirin 325 MG EC tablet  Take 1 tablet (325 mg total) by mouth 2 (two) times daily after a meal.     docusate sodium 100 MG capsule  Commonly known as:  COLACE  Take 1 capsule (100 mg total) by mouth 2 (two) times daily.  Dulaglutide 0.75 MG/0.5ML Sopn  Inject 0.75 mg into the skin once a week.     famotidine 20 MG tablet  Commonly known as:  PEPCID  Take 1 tablet (20 mg total) by mouth daily.     hydrochlorothiazide 12.5 MG capsule  Commonly known as:  MICROZIDE  Take 12.5 mg by mouth every morning.     insulin aspart 100 UNIT/ML injection  Commonly known as:  novoLOG  Inject 10 Units into the skin daily with lunch.     insulin aspart protamine- aspart (70-30) 100 UNIT/ML injection  Commonly known as:  NOVOLOG MIX 70/30  Inject 30 Units into the skin every morning.     metFORMIN 1000 MG tablet  Commonly known as:  GLUCOPHAGE  Take 1,000 mg by mouth 2 (two) times daily with a meal.     methocarbamol 500 MG tablet  Commonly known as:  ROBAXIN  Take 1 tablet (500 mg total) by mouth every 6 (six) hours as needed for muscle spasms.     metoprolol  succinate 50 MG 24 hr tablet  Commonly known as:  TOPROL-XL  Take 50 mg by mouth at bedtime. Take with or immediately following a meal.     oxyCODONE-acetaminophen 5-325 MG per tablet  Commonly known as:  ROXICET  Take 1-2 tablets by mouth every 4 (four) hours as needed for severe pain.     ramipril 10 MG tablet  Commonly known as:  ALTACE  Take 1 tablet (10 mg total) by mouth 2 (two) times daily.     simvastatin 10 MG tablet  Commonly known as:  ZOCOR  Take 10 mg by mouth at bedtime.     valsartan-hydrochlorothiazide 320-12.5 MG per tablet  Commonly known as:  DIOVAN-HCT  Take 1 tablet by mouth daily.        Diagnostic Studies: Dg Knee Right Port  12/19/2014   CLINICAL DATA:  Status post total knee replacement  EXAM: PORTABLE RIGHT KNEE - 1-2 VIEW  COMPARISON:  None.  FINDINGS: Frontal and lateral views were obtained. The patient has undergone total knee replacement with femoral and tibial prosthetic components appearing well-seated. No fracture or dislocation. Air within the joint is an expected postoperative finding.  IMPRESSION: Prosthetic components appear well seated. No acute fracture or dislocation.   Electronically Signed   By: Bretta BangWilliam  Woodruff III M.D.   On: 12/19/2014 10:06    Disposition: 01-Home or Self Care      Discharge Instructions    Discharge wound care:    Complete by:  As directed   Keep dressing clean and intact. May shower with dressing intact.     Weight bearing as tolerated    Complete by:  As directed   Laterality:  right  Extremity:  Lower           Follow-up Information    [facility]   Why:  Home Health Physical Therapy   Contact information:   [address] [place] [address] [phone]       Follow up with Kathryne HitchBLACKMAN,Christopher Y, MD. Schedule an appointment as soon as possible for a visit in 2 weeks.   Specialty:  Orthopedic Surgery   Contact information:   [address] [place] [address] [phone]        Signed: Richardean CanalCLARK, GILBERT 12/22/2014, 8:08 AM

## 2014-12-22 NOTE — Care Management Note (Signed)
Case Management Note  Patient Details  Name: Christopher Rasmussen B [place] MRN: [id number] Date of Birth: [birth year]  Subjective/Objective:              Status post total right knee replacement   Action/Plan: Discharge planning  Expected Discharge Date:  12/21/14               Expected Discharge Plan:  Home w Home Health Services  In-House Referral:     Discharge planning Services  CM Consult  Post Acute Care Choice:  Home Health Choice offered to:  Patient  DME Arranged:    DME Agency:     HH Arranged:  PT [facility]  Status of Service:  Completed, signed off  Medicare Important Message Given:  Yes Date Medicare IM Given:  12/22/14 Medicare IM give by:  Lorenda IshiharaSuzanne Edrie Ehrich RN CM  Date Additional Medicare IM Given:    Additional Medicare Important Message give by:     If discussed at Long Length of Stay Meetings, dates discussed:    Additional Comments:  Alexis Goodelleele, Nikeisha Klutz K, RN 12/22/2014, 10:29 AM

## 2014-12-22 NOTE — Progress Notes (Signed)
Occupational Therapy Treatment Patient Details Name: Christopher MightyJohn B Kreiser MRN: [id number] DOB: Dec 10, 1945 Today's Date: 12/22/2014    History of present illness R TKR; L TKR manipulation (L TKR 1/16)   OT comments  Ready for DC  Follow Up Recommendations  No OT follow up          Precautions / Restrictions Precautions Precautions: Knee;Fall Required Braces or Orthoses: Knee Immobilizer - Right Knee Immobilizer - Right: Discontinue once straight leg raise with < 10 degree lag       Mobility Bed Mobility               General bed mobility comments: pt in chair  Transfers Overall transfer level: Needs assistance Equipment used: Rolling walker (2 wheeled) Transfers: Sit to/from Stand Sit to Stand: From elevated surface;+2 safety/equipment;Min assist Stand pivot transfers: Min assist       General transfer comment: increased time and VC    Balance                                   ADL Overall ADL's : Needs assistance/impaired                     Lower Body Dressing: Cueing for safety;Cueing for sequencing;With adaptive equipment;Moderate assistance Lower Body Dressing Details (indicate cue type and reason): AE education. Pt needed increased time and VC.  Wife will purchase AE as this will decrease burden on her at home.              Functional mobility during ADLs: Minimal assistance General ADL Comments: pt needs increased time and VC.  Wife will a as needed      Vision                             Extremity/Trunk Assessment                          Pertinent Vitals/ Pain       Pain Score: 4  Pain Location: r knee Pain Descriptors / Indicators: Sore Pain Intervention(s): Monitored during session;Repositioned  Home Living                                              Frequency       Progress Toward Goals  OT Goals(current goals can now be found in the care plan section)  Progress  towards OT goals: Progressing toward goals     Plan Discharge plan remains appropriate    Co-evaluation                 End of Session Equipment Utilized During Treatment: Rolling walker   Activity Tolerance Patient tolerated treatment well   Patient Left Other (comment) (in WC for DC)   Nurse Communication          Time: 2440-1027: 1100-1124 OT Time Calculation (min): 24 min  Charges: OT General Charges $OT Visit: 1 Procedure OT Treatments $Self Care/Home Management : 23-37 mins  Adaley Kiene, Metro KungLorraine D 12/22/2014, 11:29 AM

## 2014-12-22 NOTE — Discharge Instructions (Signed)
Right knee weight bearing as tolerated . Keep dressing clean and intact. May shower with dressing intact.

## 2014-12-22 NOTE — Progress Notes (Signed)
Subjective: 3 Days Post-Op Procedure(s) (LRB): RIGHT TOTAL KNEE ARTHROPLASTY (Right) CLOSED MANIPULATION  LEFT KNEE (Left) Patient reports pain as mild.   States he feels great wanting to go home. Objective: Vital signs in last 24 hours: Temp:  [98.5 F (36.9 C)-99.6 F (37.6 C)] 98.7 F (37.1 C) (05/16 0538) Pulse Rate:  [92-110] 96 (05/16 0538) Resp:  [16-20] 18 (05/16 0538) BP: (125-189)/(54-74) 158/71 mmHg (05/16 0538) SpO2:  [92 %-98 %] 96 % (05/16 0538)  Intake/Output from previous day: 05/15 0701 - 05/16 0700 In: 2086.3 [P.O.:1320; I.V.:456.3; Blood:310] Out: 2525 [Urine:2525] Intake/Output this shift:     Recent Labs  12/20/14 0415 12/21/14 0456 12/22/14 0528  HGB 12.5* 10.6* 11.5*    Recent Labs  12/21/14 0456 12/22/14 0528  WBC 11.4* 7.5  RBC 3.85* 4.05*  HCT 32.3* 32.9*  PLT 170 156    Recent Labs  12/21/14 0456 12/22/14 0528  NA 133* 138  K 4.5 3.8  CL 99* 103  CO2 26 23  BUN 37* 13  CREATININE 1.27* 0.60*  GLUCOSE 177* 133*  CALCIUM 8.8* 8.5*   No results for input(s): LABPT, INR in the last 72 hours. Right Leg  Sensation intact distally Intact pulses distally Dorsiflexion/Plantar flexion intact Incision: scant drainage Compartment soft  Assessment/Plan: 3 Days Post-Op Procedure(s) (LRB): RIGHT TOTAL KNEE ARTHROPLASTY (Right) CLOSED MANIPULATION  LEFT KNEE (Left) Discharge home with home health  Follow up in  2 weeks with Dr.Blackman Renal function improved Mckinley Adelstein 12/22/2014, 8:03 AM

## 2014-12-22 NOTE — Progress Notes (Signed)
Physical Therapy Treatment Patient Details Name: Christopher MightyJohn B Ingber MRN: [id number] DOB: [birth year] Today's Date: 12/22/2014    History of Present Illness R TKR; L TKR manipulation (L TKR 1/16)    PT Comments    POD #3 spouse present.  Pt eager to D/C to home today and dressed.  Applied KI and instructed spouse on use for amb and stairs.  Had spouse assist pt out of recliner and amb in hallway.  Practiced 2 steps with B rails.  Great difficulty going "up with the good" as her recenly had TKR opposite side.  So B LE are weak.  During gait, pt puts much weight through walker. Returned to room and performed TKR TE's.  Handout in room.  Instructions on proper tech and freq.  Applied ICE.  Pt required increased, increased time to perform tasks plus required extra VC's for safety and sequencing.    Follow Up Recommendations  Home health PT;Supervision/Assistance - 24 hour     Equipment Recommendations  None recommended by PT    Recommendations for Other Services       Precautions / Restrictions Precautions Precautions: Knee;Fall Precaution Comments: instructed pt and spouse on KI use for amb and stairs Required Braces or Orthoses: Knee Immobilizer - Right Knee Immobilizer - Right: Discontinue once straight leg raise with < 10 degree lag Restrictions Weight Bearing Restrictions: No    Mobility  Bed Mobility               General bed mobility comments: Pt OOB in recliner  Transfers Overall transfer level: Needs assistance Equipment used: Rolling walker (2 wheeled) Transfers: Sit to/from Stand Sit to Stand: Supervision;Min guard Stand pivot transfers: Min assist       General transfer comment: increased time and VC  Ambulation/Gait Ambulation/Gait assistance: Min guard;Min assist Ambulation Distance (Feet): 2 Feet Assistive device: Rolling walker (2 wheeled) Gait Pattern/deviations: Step-to pattern Gait velocity: decr   General Gait Details: .  50% VC's on proper  sequencing.  Safety with turns, upright posture and safety with turns.    Stairs Stairs: Yes Stairs assistance: Min assist Stair Management: Two rails;Forwards Number of Stairs: 2 General stair comments: with great difficulty and 50% VC's on proper sequencing.  Spouse present.  After performing stairs, they decided to get pt in the house via wheelchair.   Wheelchair Mobility    Modified Rankin (Stroke Patients Only)       Balance                                    Cognition Arousal/Alertness: Awake/alert Behavior During Therapy: WFL for tasks assessed/performed Overall Cognitive Status: Within Functional Limits for tasks assessed                      Exercises   Total Knee Replacement TE's 10 reps B LE ankle pumps 10 reps towel squeezes 10 reps knee presses 10 reps heel slides  10 reps SAQ's 10 reps SLR's 10 reps ABD Followed by ICE     General Comments        Pertinent Vitals/Pain Pain Assessment: 0-10 Pain Score: 3  Pain Location: R knee Pain Descriptors / Indicators: Sore Pain Intervention(s): Monitored during session;Premedicated before session;Repositioned;Ice applied    Home Living                      Prior Function  PT Goals (current goals can now be found in the care plan section) Progress towards PT goals: Progressing toward goals    Frequency  7X/week    PT Plan      Co-evaluation             End of Session Equipment Utilized During Treatment: Right knee immobilizer;Gait belt Activity Tolerance: Patient tolerated treatment well Patient left: with call bell/phone within reach;in bed;with family/visitor present     Time: 1002-1057 PT Time Calculation (min) (ACUTE ONLY): 55 min  Charges:  $Gait Training: 8-22 mins $Therapeutic Exercise: 8-22 mins $Therapeutic Activity: 8-22 mins $Self Care/Home Management: 8-22                    G Codes:      Felecia ShellingLori Andrea Colglazier  PTA [facility]  Acute   Rehab Pager      [phone]

## 2015-01-13 ENCOUNTER — Ambulatory Visit: Payer: Medicare PPO | Attending: Orthopaedic Surgery | Admitting: Physical Therapy

## 2015-01-13 DIAGNOSIS — Z9889 Other specified postprocedural states: Secondary | ICD-10-CM | POA: Diagnosis present

## 2015-01-13 DIAGNOSIS — M25561 Pain in right knee: Secondary | ICD-10-CM | POA: Diagnosis present

## 2015-01-13 DIAGNOSIS — Z96651 Presence of right artificial knee joint: Secondary | ICD-10-CM

## 2015-01-13 DIAGNOSIS — M25662 Stiffness of left knee, not elsewhere classified: Secondary | ICD-10-CM | POA: Insufficient documentation

## 2015-01-13 DIAGNOSIS — M25661 Stiffness of right knee, not elsewhere classified: Secondary | ICD-10-CM

## 2015-01-13 DIAGNOSIS — R29898 Other symptoms and signs involving the musculoskeletal system: Secondary | ICD-10-CM | POA: Insufficient documentation

## 2015-01-13 DIAGNOSIS — R609 Edema, unspecified: Secondary | ICD-10-CM | POA: Insufficient documentation

## 2015-01-13 NOTE — Therapy (Signed)
[facility] [address] [place], [place], [address] Phone: [phone]   Fax:  [phone]  Physical Therapy Evaluation  Patient Details  Name: Christopher MightyJohn B Lintner MRN: [id number] Date of Birth: September 06, 1945 Referring Provider:  Kathryne HitchBlackman, Christopher Y*  Encounter Date: 01/13/2015      PT End of Session - 01/13/15 1523    Visit Number 1   Number of Visits 18   Date for PT Re-Evaluation 03/10/15   Authorization Type [employer] G codes   PT Start Time 0933   PT Stop Time 1015   PT Time Calculation (min) 42 min   Activity Tolerance Patient tolerated treatment well      Past Medical History  Diagnosis Date  . Hypertension   . High cholesterol     under control  . Type II diabetes mellitus   . Arthritis     Past Surgical History  Procedure Laterality Date  . Knee arthroscopy Left 1990's  . Total knee arthroplasty Left 08/22/2014    Procedure: LEFT TOTAL KNEE ARTHROPLASTY;  Surgeon: Kathryne Hitchhristopher Y Blackman, MD;  [facility]: [facility];  Service: Orthopedics;  Laterality: Left;  . Knee closed reduction Left 10/17/2014    Procedure: CLOSED MANIPULATION LEFT KNEE UNDER ANESTHESIA;  Surgeon: Kathryne Hitchhristopher Y Blackman, MD;  [facility];  Service: Orthopedics;  Laterality: Left;  . Total knee arthroplasty Right 12/19/2014    Procedure: RIGHT TOTAL KNEE ARTHROPLASTY;  Surgeon: Kathryne Hitchhristopher Y Blackman, MD;  [facility];  Service: Orthopedics;  Laterality: Right;  . Knee closed reduction Left 12/19/2014    Procedure: CLOSED MANIPULATION  LEFT KNEE;  Surgeon: Kathryne Hitchhristopher Y Blackman, MD;  [facility];  Service: Orthopedics;  Laterality: Left;    There were no vitals filed for this visit.  Visit Diagnosis:  S/P TKR (total knee replacement) using cement, right - Plan: PT plan of care cert/re-cert  S/P surgical manipulation of knee joint - Plan: PT plan of care cert/re-cert  Joint stiffness of knee, left - Plan: PT plan of care  cert/re-cert  Knee stiffness, right - Plan: PT plan of care cert/re-cert  Weakness of both lower extremities - Plan: PT plan of care cert/re-cert      Subjective Assessment - 01/13/15 0937    Subjective Right TKR 12/19/14 and left knee joint manipulation as well.  Left TKR 08/22/14.  Discharge home from hospital with HHPT which finished on Friday.  Had close to 90 degrees. RW full time but can take a few steps without.  Unable to drive yet.     Limitations Walking;Standing   How long can you sit comfortably? as long as wanted   How long can you walk comfortably? around the house   Patient Stated Goals be able to walk without pain   Currently in Pain? Yes   Pain Score 2    Pain Location Knee   Pain Orientation Right   Pain Descriptors / Indicators Sore   Pain Type Surgical pain   Pain Onset 1 to 4 weeks ago   Pain Frequency Constant   Aggravating Factors  bending knee, walking prolonged   Pain Relieving Factors meds,no ice recently            [facility] - 01/13/15 0943    Assessment   Medical Diagnosis right TKA   Onset Date/Surgical Date 12/19/14   Next MD Visit 02/03/15   Prior Therapy HHPT   Precautions   Precautions None   Restrictions   Weight Bearing  Restrictions No   Balance Screen   Has the patient fallen in the past 6 months No   Has the patient had a decrease in activity level because of a fear of falling?  No   Is the patient reluctant to leave their home because of a fear of falling?  No   Home Environment   Living Environment Private residence   Living Arrangements Spouse/significant other;Children   Available Help at Discharge Family;Available 24 hours/day   Type of Home House   Home Access Stairs to enter   Entrance Stairs-Number of Steps 4   Home Layout One level   Prior Function   Level of Independence Independent with basic ADLs;Independent with gait;Independent with transfers   Vocation Retired   [employer] retired Runner, broadcasting/film/video    Leisure play with grandchildren; golf   Observation/Other Assessments   Observations redness where dressing was   Skin Integrity healing incision; mildly increased warmth and redness L knee; will monitor; increased LLE edema   Focus on Therapeutic Outcomes (FOTO)  68%   Observation/Other Assessments-Edema    Edema Circumferential   Circumferential Edema   Circumferential - Right mid patella 45 1/2 cm; 6cm above patella 47 cm   Circumferential - Left  mid patella 42 1/2; 6 cm above sup pole 46 cm   ROM / Strength   AROM / PROM / Strength AROM;PROM;Strength   AROM   Right Knee Extension 18   Right Knee Flexion 72  supine; sitting 82 degrees   Left Knee Extension 15   Left Knee Flexion 95   PROM   Right/Left Knee Right;Left   Left Knee Extension 10   Left Knee Flexion 104   Strength   Right Knee Flexion 3/5   Right Knee Extension 3/5   Left Knee Flexion 4/5   Left Knee Extension 4/5                   [facility] Adult PT Treatment/Exercise - 01/13/15 0001    Knee/Hip Exercises: Aerobic   Stationary Bike Nu-Step L2 5 min   Manual Therapy   Manual Therapy Passive ROM;Soft tissue mobilization   Joint Mobilization patella mob   Soft tissue mobilization HS and quads contract/relax   Passive ROM Flexion and extension range with over pressure                PT Education - 01/13/15 1522    Education provided Yes   Education Details knee ROM handouts for both flex and ext 3-5 min every 2 hours   Person(s) Educated Patient   Methods Explanation;Demonstration;Handout   Comprehension Verbalized understanding          PT Short Term Goals - 01/13/15 1534    PT SHORT TERM GOAL #1   Title Patient will demonstrate compliance with initial HEP including frequent ROM for flexion and ext.   Time 4   Period Weeks   Status New   PT SHORT TERM GOAL #2   Title Increase right knee flexion to 90 degrees needed for greater ease getting in/out of the car.   Time 4   Period  Weeks   Status New   PT SHORT TERM GOAL #3   Title Right knee extension ROM improved to 12 degrees needed for normalization of gait.     Time 4   Period Weeks   Status New   PT SHORT TERM GOAL #4   Title ambulate > 250' with LRAD modified independent for improved mobility (10/14/14)  Time 4   Period Weeks   Status New   PT SHORT TERM GOAL #5   Title Left knee AROM 10-100 degrees needed for greater ease with sit to stand,.   Time 4   Period Weeks   Status New           PT Long Term Goals - 2015/01/24 1537    PT LONG TERM GOAL #1   Title independent with advanced HEP needed for futher gains in ROM and strength in bilateral LEs.   Time 8   Period Weeks   Status New   PT LONG TERM GOAL #2   Title improve R knee AROM 8-95 for improved motion and mobility for playing with grandkids, going up and down steps to enter/exit home   Time 8   Period Weeks   Status On-going   PT LONG TERM GOAL #3   Title Patient will be able to ambulate >.60m/sec needed for community ambulation using cane.   Time 8   Period Weeks   Status On-going   PT LONG TERM GOAL #4   Title Right quad and HS strength 4-/5 needed for standing and ambulating longer periods of time and prepare for eventual return to golf.   Time 8   Period Weeks   Status On-going   PT LONG TERM GOAL #5   Title FOTO functional outcome score improved from 68% to 41% indicating improved function with less pain   Time 8   Period Weeks   Status New   Additional Long Term Goals   Additional Long Term Goals Yes   PT LONG TERM GOAL #6   Title Left knee AROM 7-105 degrees needed for improved mobility on curbs and stairs in the community and at home.   Time 8   Period Weeks   Status New               Plan - 01-24-2015 1524    Clinical Impression Statement Patient is a pleasant 69 year old who had a previous Left TKR in January and a right TKR and left knee manipulation on 12/20/14 (3 1/2 weeks ago).  He presents using a RW but is  able to take a few steps without an assistive device.  He has moderate swelling of right knee (1-3 cm difference).  His incision is red in a rectangular pattern surrounding which patient reports is from a shower that was too hot after his dressing was removed.  He reports completing HHPT last week with "close to 90 degrees" .  Right supine AROM:  15-72 degrees, seated 15-82.  Left knee AROM 15-98.  Right quad lag.  Right knee extension and flex strength 3/5.  Left grossly 4/5.  Decreased HS length bilaterally.  Decreased right patellar mobility.  He would benefit from P T to address these deficits.     Pt will benefit from skilled therapeutic intervention in order to improve on the following deficits Abnormal gait;Decreased activity tolerance;Decreased range of motion;Decreased strength;Decreased scar mobility;Difficulty walking;Hypomobility;Pain   Rehab Potential Good   PT Frequency 3x / week  may decrease to 2x/week after 2-3 weeks   PT Duration 8 weeks   PT Treatment/Interventions ADLs/Self Care Home Management;Cryotherapy;Electrical Stimulation;Therapeutic activities;Therapeutic exercise;Neuromuscular re-education;Patient/family education;Scar mobilization;Manual techniques;Vasopneumatic Device   PT Next Visit Plan Right and left knee ROM flex and ext; Nu-Step; manual techniques; quad exercises; cryotherapy          G-Codes - 01/24/15 1544    Functional Assessment Tool Used clinical  judgement; FOTO   Functional Limitation Mobility: Walking and moving around   Mobility: Walking and Moving Around Current Status [phone]) At least 60 percent but less than 80 percent impaired, limited or restricted   Mobility: Walking and Moving Around Goal Status [phone]) At least 40 percent but less than 60 percent impaired, limited or restricted       Problem List Patient Active Problem List   Diagnosis Date Noted  . Osteoarthritis of right knee 12/19/2014  . Status post total right knee replacement  12/19/2014  . Arthrofibrosis of total knee arthroplasty, left 10/17/2014  . Ileus, postoperative   . Acute renal failure with tubular necrosis   . Acute renal failure 08/23/2014  . Abdominal distension 08/23/2014  . Hyperkalemia 08/23/2014  . Arthritis of right knee 08/22/2014  . Status post total left knee replacement 08/22/2014  . Chest pain 02/22/2013  . HTN (hypertension) 02/22/2013  . HLD (hyperlipidemia) 02/22/2013  . Diabetes 02/22/2013    Vivien Presto 01/13/2015, 3:49 PM  [facility] [address] [place], [place], [address] Phone: [phone]   Fax:  [phone]   Lavinia Sharps, PT 01/13/2015 3:49 PM Phone: [phone] Fax: [phone]

## 2015-01-13 NOTE — Patient Instructions (Signed)
Knee flexion and extension handouts 3-5 min every 2 hours.  Use of ice to decrease swelling;  Patellar mobs and scar mobilization

## 2015-01-14 ENCOUNTER — Encounter: Payer: Medicare PPO | Admitting: Physical Therapy

## 2015-01-19 ENCOUNTER — Ambulatory Visit: Payer: Medicare PPO | Admitting: Physical Therapy

## 2015-01-19 DIAGNOSIS — R29898 Other symptoms and signs involving the musculoskeletal system: Secondary | ICD-10-CM

## 2015-01-19 DIAGNOSIS — Z96651 Presence of right artificial knee joint: Secondary | ICD-10-CM | POA: Diagnosis not present

## 2015-01-19 DIAGNOSIS — M25661 Stiffness of right knee, not elsewhere classified: Secondary | ICD-10-CM

## 2015-01-19 DIAGNOSIS — M25561 Pain in right knee: Secondary | ICD-10-CM

## 2015-01-19 NOTE — Therapy (Signed)
[facility] [address] [place], [place], [address] Phone: [phone]   Fax:  [phone]  Physical Therapy Treatment  Patient Details  Name: Christopher Rasmussen MRN: [id number] Date of Birth: 09-30-1945 Referring Provider:  Aida Puffer, MD  Encounter Date: 01/19/2015      PT End of Session - 01/19/15 1508    Visit Number 2   Number of Visits 18   Date for PT Re-Evaluation 03/10/15   PT Start Time 1415   PT Stop Time 1508   PT Time Calculation (min) 53 min   Activity Tolerance Patient tolerated treatment well      Past Medical History  Diagnosis Date  . Hypertension   . High cholesterol     under control  . Type II diabetes mellitus   . Arthritis     Past Surgical History  Procedure Laterality Date  . Knee arthroscopy Left 1990's  . Total knee arthroplasty Left 08/22/2014    Procedure: LEFT TOTAL KNEE ARTHROPLASTY;  Surgeon: Kathryne Hitch, MD;  Location: [facility];  Service: Orthopedics;  Laterality: Left;  . Knee closed reduction Left 10/17/2014    Procedure: CLOSED MANIPULATION LEFT KNEE UNDER ANESTHESIA;  Surgeon: Kathryne Hitch, MD;  [facility]: [facility];  Service: Orthopedics;  Laterality: Left;  . Total knee arthroplasty Right 12/19/2014    Procedure: RIGHT TOTAL KNEE ARTHROPLASTY;  Surgeon: Kathryne Hitch, MD;  [facility]: [facility];  Service: Orthopedics;  Laterality: Right;  . Knee closed reduction Left 12/19/2014    Procedure: CLOSED MANIPULATION  LEFT KNEE;  Surgeon: Kathryne Hitch, MD;  [facility]: [facility];  Service: Orthopedics;  Laterality: Left;    There were no vitals filed for this visit.  Visit Diagnosis:  Right knee pain  Knee stiffness, right  Weakness of both lower extremities      Subjective Assessment - 01/19/15 1422    Subjective No device today.     Currently in Pain? Yes   Pain Score 5    Pain Location Knee   Pain Orientation Right;Anterior;Lateral   Pain  Descriptors / Indicators Sore   Pain Type Surgical pain   Pain Onset More than a month ago   Pain Frequency Constant                         [facility] Adult PT Treatment/Exercise - 01/19/15 1442    Knee/Hip Exercises: Supine   Quad Sets Right;Both;1 set;10 reps   Bridges Strengthening;Both;1 set;10 reps   Bridges Limitations with ball    Straight Leg Raises Strengthening;Right;1 set;10 reps   Straight Leg Raise with External Rotation Strengthening;Right;1 set;10 reps   Knee Flexion AAROM;Right;1 set;10 reps   Knee Flexion Limitations up to 85 deg   Other Supine Knee Exercises hip add squeeze   Cryotherapy   Number Minutes Cryotherapy 10 Minutes   Cryotherapy Location Knee   Type of Cryotherapy Ice pack   Manual Therapy   Manual Therapy Edema management;Passive ROM;Taping   Joint Mobilization patellar mob   Soft tissue mobilization Rt. lateral thigh soreness   Passive ROM flexion, ext   Kinesiotex Edema   Kinesiotix   Edema Rt. knee 2 fans anterior      NuStep LEs 5 min level 5           PT Education - 01/19/15 1508    Education provided Yes   Education Details JAS/Dynsplint for incr ROM   Person(s) Educated Patient  Methods Explanation;Demonstration;Handout   Comprehension Verbalized understanding;Need further instruction          PT Short Term Goals - 01/19/15 1513    PT SHORT TERM GOAL #1   Title Patient will demonstrate compliance with initial HEP including frequent ROM for flexion and ext.   Status On-going   PT SHORT TERM GOAL #2   Title Increase right knee flexion to 90 degrees needed for greater ease getting in/out of the car.   Status On-going   PT SHORT TERM GOAL #3   Title Right knee extension ROM improved to 12 degrees needed for normalization of gait.     Status On-going   PT SHORT TERM GOAL #4   Title ambulate > 250' with LRAD modified independent for improved mobility (10/14/14)   Status On-going   PT SHORT TERM GOAL #5   Title  Left knee AROM 10-100 degrees needed for greater ease with sit to stand,.   Status On-going           PT Long Term Goals - 01/19/15 1513    PT LONG TERM GOAL #1   Title independent with advanced HEP needed for futher gains in ROM and strength in bilateral LEs.   Status On-going   PT LONG TERM GOAL #2   Title improve R knee AROM 8-95 for improved motion and mobility for playing with grandkids, going up and down steps to enter/exit home   Status On-going   PT LONG TERM GOAL #3   Title Patient will be able to ambulate >.70m/sec needed for community ambulation using cane.   Status On-going   PT LONG TERM GOAL #4   Title Right quad and HS strength 4-/5 needed for standing and ambulating longer periods of time and prepare for eventual return to golf.   Status On-going   PT LONG TERM GOAL #5   Title FOTO functional outcome score improved from 68% to 41% indicating improved function with less pain   Status On-going   PT LONG TERM GOAL #6   Title Left knee AROM 7-105 degrees needed for improved mobility on curbs and stairs in the community and at home.   Status On-going               Plan - 01/19/15 1509    Clinical Impression Statement No change from eval, except has stopped using walker altogther.  I  advised him to use it occ if needed for community especially.  I also thought that given his history with previous knee, he might consider using a progressive splint to increase AROM early on.  He will consider.    PT Next Visit Plan Right and left knee ROM flex and ext; Nu-Step; manual techniques; quad exercises; cryotherapy, manage edema assess tape   PT Home Exercise Plan asked to bring in HEP   Consulted and Agree with Plan of Care Patient        Problem List Patient Active Problem List   Diagnosis Date Noted  . Osteoarthritis of right knee 12/19/2014  . 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/19/2014  . Arthrofibrosis of total knee arthroplasty, left 10/17/2014  .  Ileus, postoperative   . Acute renal failure with tubular necrosis   . Acute renal failure 08/23/2014  . Abdominal distension 08/23/2014  . Hyperkalemia 08/23/2014  . Arthritis of right knee 08/22/2014  . Status post total left knee replacement 08/22/2014  . Chest pain 02/22/2013  . HTN (hypertension) 02/22/2013  . HLD (hyperlipidemia) 02/22/2013  .  Diabetes 02/22/2013    PAA,JENNIFER 01/19/2015, 3:16 PM  [facility] [address] [place], [place], [address] Phone: [phone]   Fax:  [phone]    Karie Mainland, PT 01/19/2015 3:17 PM Phone: [phone] Fax: [phone]

## 2015-01-21 ENCOUNTER — Ambulatory Visit: Payer: Medicare PPO

## 2015-01-21 DIAGNOSIS — M25661 Stiffness of right knee, not elsewhere classified: Secondary | ICD-10-CM

## 2015-01-21 DIAGNOSIS — M25561 Pain in right knee: Secondary | ICD-10-CM

## 2015-01-21 DIAGNOSIS — R29898 Other symptoms and signs involving the musculoskeletal system: Secondary | ICD-10-CM

## 2015-01-21 DIAGNOSIS — R609 Edema, unspecified: Secondary | ICD-10-CM

## 2015-01-21 DIAGNOSIS — Z96651 Presence of right artificial knee joint: Secondary | ICD-10-CM | POA: Diagnosis not present

## 2015-01-21 NOTE — Therapy (Signed)
[facility] [address] [place], [place], [address] Phone: [phone]   Fax:  [phone]  Physical Therapy Treatment  Patient Details  Name: Christopher Rasmussen MRN: [id number] Date of Birth: 11/25/1945 Referring Provider:  Aida Puffer, MD  Encounter Date: 01/21/2015      PT End of Session - 01/21/15 0843    Visit Number 3   PT Start Time 0805   PT Stop Time 0900   PT Time Calculation (min) 55 min   Activity Tolerance Patient tolerated treatment well   Behavior During Therapy [facility] for tasks assessed/performed      Past Medical History  Diagnosis Date  . Hypertension   . High cholesterol     under control  . Type II diabetes mellitus   . Arthritis     Past Surgical History  Procedure Laterality Date  . Knee arthroscopy Left 1990's  . Total knee arthroplasty Left 08/22/2014    Procedure: LEFT TOTAL KNEE ARTHROPLASTY;  Surgeon: Kathryne Hitch, MD;  [facility]: [facility];  Service: Orthopedics;  Laterality: Left;  . Knee closed reduction Left 10/17/2014    Procedure: CLOSED MANIPULATION LEFT KNEE UNDER ANESTHESIA;  Surgeon: Kathryne Hitch, MD;  [facility];  Service: Orthopedics;  Laterality: Left;  . Total knee arthroplasty Right 12/19/2014    Procedure: RIGHT TOTAL KNEE ARTHROPLASTY;  Surgeon: Kathryne Hitch, MD;  [facility];  Service: Orthopedics;  Laterality: Right;  . Knee closed reduction Left 12/19/2014    Procedure: CLOSED MANIPULATION  LEFT KNEE;  Surgeon: Kathryne Hitch, MD;  [facility];  Service: Orthopedics;  Laterality: Left;    There were no vitals filed for this visit.  Visit Diagnosis:  Knee stiffness, right  Weakness of both lower extremities  Right knee pain  Edema      Subjective Assessment - 01/21/15 0805    Subjective Trying to go without device to improve walking. the rT knee is improving quicker than LT   Currently in Pain? Yes   Pain Score 5                           [facility] Adult PT Treatment/Exercise - 01/21/15 0806    Knee/Hip Exercises: Aerobic   Stationary Bike Nu-Step L2 5 min   Knee/Hip Exercises: Supine   Quad Sets Both;1 set;15 reps   Quad Sets Limitations legs on ball and 5 sec hold   Bridges Strengthening;Both;1 set;15 reps   Bridges Limitations with ball    Straight Leg Raises Strengthening;Right;1 set;10 reps   Straight Leg Raises Limitations lifting leg on and off ball   Knee Flexion AAROM;Right;1 set;15 reps   Other Supine Knee Exercises hip add squeeze 3-5 sec x 15   Cryotherapy   Number Minutes Cryotherapy 10 Minutes   Cryotherapy Location Knee   Type of Cryotherapy Ice pack   Manual Therapy   Joint Mobilization patellar mob and AP tibia glides Gr 3 with IR   Soft tissue mobilization quad and lateral thigh, patella tendon and scar with rock tool   Passive ROM flexion stretch                   PT Short Term Goals - 01/19/15 1513    PT SHORT TERM GOAL #1   Title Patient will demonstrate compliance with initial HEP including frequent ROM for flexion and ext.   Status On-going   PT SHORT TERM GOAL #2  Title Increase right knee flexion to 90 degrees needed for greater ease getting in/out of the car.   Status On-going   PT SHORT TERM GOAL #3   Title Right knee extension ROM improved to 12 degrees needed for normalization of gait.     Status On-going   PT SHORT TERM GOAL #4   Title ambulate > 250' with LRAD modified independent for improved mobility (10/14/14)   Status On-going   PT SHORT TERM GOAL #5   Title Left knee AROM 10-100 degrees needed for greater ease with sit to stand,.   Status On-going           PT Long Term Goals - 01/19/15 1513    PT LONG TERM GOAL #1   Title independent with advanced HEP needed for futher gains in ROM and strength in bilateral LEs.   Status On-going   PT LONG TERM GOAL #2   Title improve R knee AROM 8-95 for improved motion and mobility  for playing with grandkids, going up and down steps to enter/exit home   Status On-going   PT LONG TERM GOAL #3   Title Patient will be able to ambulate >.4m/sec needed for community ambulation using cane.   Status On-going   PT LONG TERM GOAL #4   Title Right quad and HS strength 4-/5 needed for standing and ambulating longer periods of time and prepare for eventual return to golf.   Status On-going   PT LONG TERM GOAL #5   Title FOTO functional outcome score improved from 68% to 41% indicating improved function with less pain   Status On-going   PT LONG TERM GOAL #6   Title Left knee AROM 7-105 degrees needed for improved mobility on curbs and stairs in the community and at home.   Status On-going               Plan - 01/21/15 0844    Clinical Impression Statement Active RT knee flexion at end of session 90 degrees , good quad set .    PT Next Visit Plan Continue as before ,possibly add weight and continue manual and range        Problem List Patient Active Problem List   Diagnosis Date Noted  . Osteoarthritis of right knee 12/19/2014  . 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/19/2014  . Arthrofibrosis of total knee arthroplasty, left 10/17/2014  . Ileus, postoperative   . Acute renal failure with tubular necrosis   . Acute renal failure 08/23/2014  . Abdominal distension 08/23/2014  . Hyperkalemia 08/23/2014  . Arthritis of right knee 08/22/2014  . Status post total left knee replacement 08/22/2014  . Chest pain 02/22/2013  . HTN (hypertension) 02/22/2013  . HLD (hyperlipidemia) 02/22/2013  . Diabetes 02/22/2013    Caprice Red PT 01/21/2015, 8:47 AM  [facility] [address] [place], [place], [address] Phone: [phone]   Fax:  [phone]

## 2015-01-23 ENCOUNTER — Ambulatory Visit: Payer: Medicare PPO

## 2015-01-23 DIAGNOSIS — M25661 Stiffness of right knee, not elsewhere classified: Secondary | ICD-10-CM

## 2015-01-23 DIAGNOSIS — R29898 Other symptoms and signs involving the musculoskeletal system: Secondary | ICD-10-CM

## 2015-01-23 DIAGNOSIS — R609 Edema, unspecified: Secondary | ICD-10-CM

## 2015-01-23 DIAGNOSIS — Z96651 Presence of right artificial knee joint: Secondary | ICD-10-CM | POA: Diagnosis not present

## 2015-01-23 NOTE — Therapy (Signed)
[facility] [address] [place], [place], [address] Phone: [phone]   Fax:  [phone]  Physical Therapy Treatment  Patient Details  Name: Christopher Rasmussen MRN: [id number] Date of Birth: [birth year] Referring Provider:  Aida Puffer, MD  Encounter Date: 01/23/2015      PT End of Session - 01/23/15 0832    Visit Number 4   Number of Visits 18   Date for PT Re-Evaluation 03/10/15   PT Start Time 0800   PT Stop Time 0855   PT Time Calculation (min) 55 min   Activity Tolerance Patient tolerated treatment well   Behavior During Therapy [facility] for tasks assessed/performed      Past Medical History  Diagnosis Date  . Hypertension   . High cholesterol     under control  . Type II diabetes mellitus   . Arthritis     Past Surgical History  Procedure Laterality Date  . Knee arthroscopy Left 1990's  . Total knee arthroplasty Left 08/22/2014    Procedure: LEFT TOTAL KNEE ARTHROPLASTY;  Surgeon: Kathryne Hitch, MD;  Location: [facility];  Service: Orthopedics;  Laterality: Left;  . Knee closed reduction Left 10/17/2014    Procedure: CLOSED MANIPULATION LEFT KNEE UNDER ANESTHESIA;  Surgeon: Kathryne Hitch, MD;  Location: [facility];  Service: Orthopedics;  Laterality: Left;  . Total knee arthroplasty Right 12/19/2014    Procedure: RIGHT TOTAL KNEE ARTHROPLASTY;  Surgeon: Kathryne Hitch, MD;  Location: [facility];  Service: Orthopedics;  Laterality: Right;  . Knee closed reduction Left 12/19/2014    Procedure: CLOSED MANIPULATION  LEFT KNEE;  Surgeon: Kathryne Hitch, MD;  Location: [facility];  Service: Orthopedics;  Laterality: Left;    There were no vitals filed for this visit.  Visit Diagnosis:  Knee stiffness, right  Weakness of both lower extremities  Edema      Subjective Assessment - 01/23/15 0759    Subjective Doing well Pain 3/10   Currently in Pain? Yes   Pain Score 3    Pain Location Knee   Multiple Pain Sites Yes            [facility] PT Assessment - 01/23/15 0001    Circumferential Edema   Circumferential - Right mid patella 44 cm., above patella  46 cm.                     [facility] Adult PT Treatment/Exercise - 01/23/15 0800    Knee/Hip Exercises: Aerobic   Stationary Bike Nu-Step L6 5 min LE only   Knee/Hip Exercises: Seated   Long Arc Quad Strengthening;Right;1 set;20 reps;Weights   Long Arc Quad Weight 3 lbs.   Knee/Hip Exercises: Supine   Short Arc Quad Sets Strengthening;Right;1 set;15 reps  3 pounds post STW with tool   Bridges Strengthening;Both;1 set;15 reps   Bridges Limitations legs on large bolster   Cryotherapy   Number Minutes Cryotherapy 10 Minutes   Cryotherapy Location Knee   Type of Cryotherapy Ice pack   Manual Therapy   Soft tissue mobilization quad and lateral thigh, patella tendon and scar with rock tool   Manual Lymphatic Drainage (MLD) retrograde massage with rock tool   Passive ROM flexion stretch     Also hamstring stretching and knee extension stretching  With quad sets              PT Short Term Goals - 01/23/15 0835    PT SHORT TERM GOAL #1  Title Patient will demonstrate compliance with initial HEP including frequent ROM for flexion and ext.   Status Achieved   PT SHORT TERM GOAL #2   Title Increase right knee flexion to 90 degrees needed for greater ease getting in/out of the car.   Status Achieved   PT SHORT TERM GOAL #3   Title Right knee extension ROM improved to 12 degrees needed for normalization of gait.     Status On-going   PT SHORT TERM GOAL #4   Title ambulate > 250' with LRAD modified independent for improved mobility (10/14/14)   Status Achieved   PT SHORT TERM GOAL #5   Title Left knee AROM 10-100 degrees needed for greater ease with sit to stand,.   Status On-going           PT Long Term Goals - 01/19/15 1513    PT LONG TERM GOAL #1   Title independent with advanced HEP needed for  futher gains in ROM and strength in bilateral LEs.   Status On-going   PT LONG TERM GOAL #2   Title improve R knee AROM 8-95 for improved motion and mobility for playing with grandkids, going up and down steps to enter/exit home   Status On-going   PT LONG TERM GOAL #3   Title Patient will be able to ambulate >.32m/sec needed for community ambulation using cane.   Status On-going   PT LONG TERM GOAL #4   Title Right quad and HS strength 4-/5 needed for standing and ambulating longer periods of time and prepare for eventual return to golf.   Status On-going   PT LONG TERM GOAL #5   Title FOTO functional outcome score improved from 68% to 41% indicating improved function with less pain   Status On-going   PT LONG TERM GOAL #6   Title Left knee AROM 7-105 degrees needed for improved mobility on curbs and stairs in the community and at home.   Status On-going               Plan - 01/23/15 0834    Clinical Impression Statement Edema reduced . passive flexion 96 degrees RT  Lt active flexion 100 degrees   PT Next Visit Plan Continue strength and manual cold stretching. Start closed chain exercises        Problem List Patient Active Problem List   Diagnosis Date Noted  . Osteoarthritis of right knee 12/19/2014  . 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/19/2014  . Arthrofibrosis of total knee arthroplasty, left 10/17/2014  . Ileus, postoperative   . Acute renal failure with tubular necrosis   . Acute renal failure 08/23/2014  . Abdominal distension 08/23/2014  . Hyperkalemia 08/23/2014  . Arthritis of right knee 08/22/2014  . Status post total left knee replacement 08/22/2014  . Chest pain 02/22/2013  . HTN (hypertension) 02/22/2013  . HLD (hyperlipidemia) 02/22/2013  . Diabetes 02/22/2013    Caprice Red PT 01/23/2015, 8:39 AM  [facility] [address] [place], [place], [address] Phone: [phone]   Fax:   [phone]

## 2015-01-27 ENCOUNTER — Ambulatory Visit: Payer: Medicare PPO | Admitting: Physical Therapy

## 2015-01-27 DIAGNOSIS — Z96651 Presence of right artificial knee joint: Secondary | ICD-10-CM | POA: Diagnosis not present

## 2015-01-27 DIAGNOSIS — M25561 Pain in right knee: Secondary | ICD-10-CM

## 2015-01-27 DIAGNOSIS — R609 Edema, unspecified: Secondary | ICD-10-CM

## 2015-01-27 DIAGNOSIS — M25661 Stiffness of right knee, not elsewhere classified: Secondary | ICD-10-CM

## 2015-01-27 DIAGNOSIS — R29898 Other symptoms and signs involving the musculoskeletal system: Secondary | ICD-10-CM

## 2015-01-27 NOTE — Therapy (Signed)
[facility] [address] [place], [place], [address] Phone: [phone]   Fax:  [phone]  Physical Therapy Treatment  Patient Details  Name: Christopher Rasmussen MRN: [id number] Date of Birth: Jan 21, 1946 Referring Provider:  Aida Puffer, MD  Encounter Date: 01/27/2015      PT End of Session - 01/27/15 1643    Visit Number 5   Number of Visits 18   Date for PT Re-Evaluation 03/10/15   PT Start Time 1335   PT Stop Time 1430   PT Time Calculation (min) 55 min   Activity Tolerance Patient tolerated treatment well   Behavior During Therapy [facility] for tasks assessed/performed      Past Medical History  Diagnosis Date  . Hypertension   . High cholesterol     under control  . Type II diabetes mellitus   . Arthritis     Past Surgical History  Procedure Laterality Date  . Knee arthroscopy Left 1990's  . Total knee arthroplasty Left 08/22/2014    Procedure: LEFT TOTAL KNEE ARTHROPLASTY;  Surgeon: Kathryne Hitch, MD;  Location: [facility];  Service: Orthopedics;  Laterality: Left;  . Knee closed reduction Left 10/17/2014    Procedure: CLOSED MANIPULATION LEFT KNEE UNDER ANESTHESIA;  Surgeon: Kathryne Hitch, MD;  Location: [facility];  Service: Orthopedics;  Laterality: Left;  . Total knee arthroplasty Right 12/19/2014    Procedure: RIGHT TOTAL KNEE ARTHROPLASTY;  Surgeon: Kathryne Hitch, MD;  Location: [facility];  Service: Orthopedics;  Laterality: Right;  . Knee closed reduction Left 12/19/2014    Procedure: CLOSED MANIPULATION  LEFT KNEE;  Surgeon: Kathryne Hitch, MD;  Location: [facility];  Service: Orthopedics;  Laterality: Left;    There were no vitals filed for this visit.  Visit Diagnosis:  Knee stiffness, right  Weakness of both lower extremities  Edema  Right knee pain  S/P TKR (total knee replacement) using cement, right      Subjective Assessment - 01/27/15 1344    Subjective No c/o today   Currently in Pain? Yes   Pain Score 3    Pain Location Knee   Pain Orientation Right   Pain Type Surgical pain   Pain Onset More than a month ago   Pain Frequency Intermittent            [facility] PT Assessment - 01/27/15 1640    AROM   Right Knee Flexion 85  94 with manual AAROM           [facility] Adult PT Treatment/Exercise - 01/27/15 1345    Knee/Hip Exercises: Standing   Heel Raises 1 set;10 reps   Terminal Knee Extension Strengthening;Left;1 set;10 reps   Lateral Step Up Right;Left;1 set;20 reps;Hand Hold: 1   Forward Step Up Right;Left;2 sets;20 reps;Hand Hold: 1   Wall Squat 2 sets;10 reps   SLS with Vectors hip ext and abd in standing 2 HHA cues for upper body    Cryotherapy   Number Minutes Cryotherapy 10 Minutes   Cryotherapy Location Knee   Type of Cryotherapy Ice pack   Manual Therapy   Joint Mobilization Gr 2-3  flexion mobs Rt. knee , also PROM to LLE    Soft tissue mobilization ROC blade   Passive ROM flexion seated            PT Education - 01/27/15 1643    Education provided No          PT Short Term Goals -  01/27/15 1347    PT SHORT TERM GOAL #1   Title Patient will demonstrate compliance with initial HEP including frequent ROM for flexion and ext.   Status Achieved   PT SHORT TERM GOAL #2   Title Increase right knee flexion to 90 degrees needed for greater ease getting in/out of the car.   Status Achieved   PT SHORT TERM GOAL #3   Title Right knee extension ROM improved to 12 degrees needed for normalization of gait.     Status On-going   PT SHORT TERM GOAL #4   Title ambulate > 250' with LRAD modified independent for improved mobility (10/14/14)   Status Achieved   PT SHORT TERM GOAL #5   Title Left knee AROM 10-100 degrees needed for greater ease with sit to stand,.   Status On-going           PT Long Term Goals - 01/27/15 1348    PT LONG TERM GOAL #1   Title independent with advanced HEP needed for futher gains in ROM and  strength in bilateral LEs.   Status On-going   PT LONG TERM GOAL #2   Title improve R knee AROM 8-95 for improved motion and mobility for playing with grandkids, going up and down steps to enter/exit home   Status On-going   PT LONG TERM GOAL #3   Title Patient will be able to ambulate >.11m/sec needed for community ambulation using cane.   Status On-going   PT LONG TERM GOAL #4   Title Right quad and HS strength 4-/5 needed for standing and ambulating longer periods of time and prepare for eventual return to golf.   Status On-going   PT LONG TERM GOAL #5   Title FOTO functional outcome score improved from 68% to 41% indicating improved function with less pain   Status Unable to assess   PT LONG TERM GOAL #6   Title Left knee AROM 7-105 degrees needed for improved mobility on curbs and stairs in the community and at home.   Status On-going               Plan - 01/27/15 1643    Clinical Impression Statement Patient tolerates manual well, needs ROM and strengthening!    PT Next Visit Plan Continue strength and manual cold stretching. Cont.  closed chain exercises, may need to cancel appt Friday   PT Home Exercise Plan asked to bring in HEP   Consulted and Agree with Plan of Care Patient        Problem List Patient Active Problem List   Diagnosis Date Noted  . Osteoarthritis of right knee 12/19/2014  . 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/19/2014  . Arthrofibrosis of total knee arthroplasty, left 10/17/2014  . Ileus, postoperative   . Acute renal failure with tubular necrosis   . Acute renal failure 08/23/2014  . Abdominal distension 08/23/2014  . Hyperkalemia 08/23/2014  . Arthritis of right knee 08/22/2014  . Status post total left knee replacement 08/22/2014  . Chest pain 02/22/2013  . HTN (hypertension) 02/22/2013  . HLD (hyperlipidemia) 02/22/2013  . Diabetes 02/22/2013    Shloimy Michalski 01/27/2015, 4:47 PM  [facility]  [facility] [address] [place], [place], [address] Phone: [phone]   Fax:  [phone]  Karie Mainland, PT 01/27/2015 4:47 PM Phone: [phone] Fax: [phone]

## 2015-01-28 ENCOUNTER — Ambulatory Visit: Payer: Medicare PPO | Admitting: Physical Therapy

## 2015-01-28 DIAGNOSIS — R29898 Other symptoms and signs involving the musculoskeletal system: Secondary | ICD-10-CM

## 2015-01-28 DIAGNOSIS — Z96651 Presence of right artificial knee joint: Secondary | ICD-10-CM

## 2015-01-28 DIAGNOSIS — M25561 Pain in right knee: Secondary | ICD-10-CM

## 2015-01-28 DIAGNOSIS — M25661 Stiffness of right knee, not elsewhere classified: Secondary | ICD-10-CM

## 2015-01-28 DIAGNOSIS — R609 Edema, unspecified: Secondary | ICD-10-CM

## 2015-01-28 NOTE — Therapy (Signed)
[facility] [address] [place], [place], [address] Phone: [phone]   Fax:  [phone]  Physical Therapy Treatment  Patient Details  Name: Christopher Rasmussen MRN: [id number] Date of Birth: June 16, 1946 Referring Provider:  Aida Puffer, MD  Encounter Date: 01/28/2015      PT End of Session - 01/27/15 1643    Visit Number 5   Number of Visits 18   Date for PT Re-Evaluation 03/10/15   PT Start Time 1335   PT Stop Time 1430   PT Time Calculation (min) 55 min   Activity Tolerance Patient tolerated treatment well   Behavior During Therapy [facility] for tasks assessed/performed      Past Medical History  Diagnosis Date  . Hypertension   . High cholesterol     under control  . Type II diabetes mellitus   . Arthritis     Past Surgical History  Procedure Laterality Date  . Knee arthroscopy Left 1990's  . Total knee arthroplasty Left 08/22/2014    Procedure: LEFT TOTAL KNEE ARTHROPLASTY;  Surgeon: Kathryne Hitch, MD;  Location: [facility];  Service: Orthopedics;  Laterality: Left;  . Knee closed reduction Left 10/17/2014    Procedure: CLOSED MANIPULATION LEFT KNEE UNDER ANESTHESIA;  Surgeon: Kathryne Hitch, MD;  Location: [facility];  Service: Orthopedics;  Laterality: Left;  . Total knee arthroplasty Right 12/19/2014    Procedure: RIGHT TOTAL KNEE ARTHROPLASTY;  Surgeon: Kathryne Hitch, MD;  Location: [facility];  Service: Orthopedics;  Laterality: Right;  . Knee closed reduction Left 12/19/2014    Procedure: CLOSED MANIPULATION  LEFT KNEE;  Surgeon: Kathryne Hitch, MD;  Location: [facility];  Service: Orthopedics;  Laterality: Left;    There were no vitals filed for this visit.  Visit Diagnosis:  Knee stiffness, right  Weakness of both lower extremities  Edema  Right knee pain  S/P TKR (total knee replacement) using cement, right      Subjective Assessment - 01/28/15 1159    Subjective Was hurting last  night after PT.     How long can you stand comfortably? 20-30 min without walker   How long can you walk comfortably? can walk around grocery store just fine, does not drive   Currently in Pain? Yes   Pain Score 4    Pain Location Knee   Pain Orientation Right            [facility] PT Assessment - 01/28/15 1214    AROM   Right Knee Extension 14   Left Knee Extension 14          [facility] Adult PT Treatment/Exercise - 01/28/15 1206    Knee/Hip Exercises: Stretches   Active Hamstring Stretch Right;3 reps   Knee/Hip Exercises: Aerobic   Stationary Bike NuStep LEs only 6 min    Knee/Hip Exercises: Machines for Strengthening   Cybex Leg Press 1 plate 2x 20 and 1 plates single leg press, ROM and  pain limits flexion   Knee/Hip Exercises: Seated   Long Arc Quad Strengthening;Right;2 sets;Weights   Long Arc Quad Weight 5 lbs.   Knee/Hip Exercises: Supine   Quad Sets Strengthening;Right;1 set;10 reps   Quad Sets Limitations hold 10 sec   Short Arc Quad Sets Strengthening;Right;1 set;20 reps   Bridges Strengthening;Both;1 set;15 reps   Bridges Limitations legs on large bolster  added knee flexion x10   Straight Leg Raise with External Rotation Strengthening;Right;1 set;15 reps   Cryotherapy   Number Minutes  Cryotherapy 10 Minutes   Cryotherapy Location Knee   Type of Cryotherapy Ice pack   Manual Therapy   Joint Mobilization patellar and Gr 2 into extension tol well                PT Education - 01/27/15 1643    Education provided No          PT Short Term Goals - 01/27/15 1347    PT SHORT TERM GOAL #1   Title Patient will demonstrate compliance with initial HEP including frequent ROM for flexion and ext.   Status Achieved   PT SHORT TERM GOAL #2   Title Increase right knee flexion to 90 degrees needed for greater ease getting in/out of the car.   Status Achieved   PT SHORT TERM GOAL #3   Title Right knee extension ROM improved to 12 degrees needed for  normalization of gait.     Status On-going   PT SHORT TERM GOAL #4   Title ambulate > 250' with LRAD modified independent for improved mobility (10/14/14)   Status Achieved   PT SHORT TERM GOAL #5   Title Left knee AROM 10-100 degrees needed for greater ease with sit to stand,.   Status On-going           PT Long Term Goals - 01/27/15 1348    PT LONG TERM GOAL #1   Title independent with advanced HEP needed for futher gains in ROM and strength in bilateral LEs.   Status On-going   PT LONG TERM GOAL #2   Title improve R knee AROM 8-95 for improved motion and mobility for playing with grandkids, going up and down steps to enter/exit home   Status On-going   PT LONG TERM GOAL #3   Title Patient will be able to ambulate >.70m/sec needed for community ambulation using cane.   Status On-going   PT LONG TERM GOAL #4   Title Right quad and HS strength 4-/5 needed for standing and ambulating longer periods of time and prepare for eventual return to golf.   Status On-going   PT LONG TERM GOAL #5   Title FOTO functional outcome score improved from 68% to 41% indicating improved function with less pain   Status Unable to assess   PT LONG TERM GOAL #6   Title Left knee AROM 7-105 degrees needed for improved mobility on curbs and stairs in the community and at home.   Status On-going               Plan - 01/27/15 1643    Clinical Impression Statement Patient tolerates manual well, needs ROM and strengthening!    PT Next Visit Plan Continue strength and manual cold stretching. Cont.  closed chain exercises, may need to cancel appt Friday   PT Home Exercise Plan asked to bring in HEP   Consulted and Agree with Plan of Care Patient        Problem List Patient Active Problem List   Diagnosis Date Noted  . Osteoarthritis of right knee 12/19/2014  . 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/19/2014  . Arthrofibrosis of total knee arthroplasty, left 10/17/2014  . Ileus,  postoperative   . Acute renal failure with tubular necrosis   . Acute renal failure 08/23/2014  . Abdominal distension 08/23/2014  . Hyperkalemia 08/23/2014  . Arthritis of right knee 08/22/2014  . Status post total left knee replacement 08/22/2014  . Chest pain 02/22/2013  . HTN (hypertension) 02/22/2013  .  HLD (hyperlipidemia) 02/22/2013  . Diabetes 02/22/2013    PAA,JENNIFER 01/28/2015, 12:43 PM  [facility] [address] [place], [place], [address] Phone: [phone]   Fax:  [phone]  Karie Mainland, PT 01/28/2015 12:43 PM Phone: [phone] Fax: [phone]

## 2015-01-30 ENCOUNTER — Ambulatory Visit: Payer: Medicare PPO | Admitting: Physical Therapy

## 2015-01-30 DIAGNOSIS — M25661 Stiffness of right knee, not elsewhere classified: Secondary | ICD-10-CM

## 2015-01-30 DIAGNOSIS — Z96651 Presence of right artificial knee joint: Secondary | ICD-10-CM | POA: Diagnosis not present

## 2015-01-30 DIAGNOSIS — M25662 Stiffness of left knee, not elsewhere classified: Secondary | ICD-10-CM

## 2015-01-30 DIAGNOSIS — R609 Edema, unspecified: Secondary | ICD-10-CM

## 2015-01-30 DIAGNOSIS — M25561 Pain in right knee: Secondary | ICD-10-CM

## 2015-01-30 NOTE — Therapy (Signed)
[facility] [address] [place], [place], [address] Phone: [phone]   Fax:  [phone]  Physical Therapy Treatment  Patient Details  Name: Christopher Rasmussen MRN: [id number] Date of Birth: May 13, 1946 Referring Provider:  Aida Puffer, MD  Encounter Date: 01/30/2015      PT End of Session - 01/30/15 1109    Visit Number 7   Number of Visits 18   Date for PT Re-Evaluation 03/10/15   PT Start Time 0915   PT Stop Time 1008   PT Time Calculation (min) 53 min      Past Medical History  Diagnosis Date  . Hypertension   . High cholesterol     under control  . Type II diabetes mellitus   . Arthritis     Past Surgical History  Procedure Laterality Date  . Knee arthroscopy Left 1990's  . Total knee arthroplasty Left 08/22/2014    Procedure: LEFT TOTAL KNEE ARTHROPLASTY;  Surgeon: Kathryne Hitch, MD;  Location: [facility];  Service: Orthopedics;  Laterality: Left;  . Knee closed reduction Left 10/17/2014    Procedure: CLOSED MANIPULATION LEFT KNEE UNDER ANESTHESIA;  Surgeon: Kathryne Hitch, MD;  Location: [facility];  Service: Orthopedics;  Laterality: Left;  . Total knee arthroplasty Right 12/19/2014    Procedure: RIGHT TOTAL KNEE ARTHROPLASTY;  Surgeon: Kathryne Hitch, MD;  [facility]: [facility];  Service: Orthopedics;  Laterality: Right;  . Knee closed reduction Left 12/19/2014    Procedure: CLOSED MANIPULATION  LEFT KNEE;  Surgeon: Kathryne Hitch, MD;  Location: [facility];  Service: Orthopedics;  Laterality: Left;    There were no vitals filed for this visit.  Visit Diagnosis:  Right knee pain  Edema  Knee stiffness, right  Joint stiffness of knee, left      Subjective Assessment - 01/30/15 0915    Subjective "things have been going pretty good since the last visit"   Currently in Pain? Yes   Pain Score 3    Pain Location Knee   Pain Orientation Right            [facility] PT Assessment -  01/30/15 0927    AROM   Right Knee Extension -13   Right Knee Flexion 91  96 following tx                     [facility] Adult PT Treatment/Exercise - 01/30/15 0001    Knee/Hip Exercises: Stretches   Active Hamstring Stretch 3 reps;30 seconds   Knee/Hip Exercises: Aerobic   Stationary Bike Bike L1 x 5 min  Rocking   Knee/Hip Exercises: Standing   Heel Raises 1 set;15 reps   Terminal Knee Extension Strengthening;Left;1 set;10 reps  on 2 in step with lateral lean durinjg TKE   Lateral Step Up Right;Left;1 set;20 reps;Hand Hold: 1;Step Height: 4"   Forward Step Up Right;Left;2 sets;20 reps;Hand Hold: 1;Step Height: 4"   Wall Squat 2 sets;10 reps   Knee/Hip Exercises: Seated   Long Arc Quad Strengthening;Right;2 sets;Weights;10 reps   Long Arc Quad Weight 5 lbs.   Knee/Hip Exercises: Supine   Quad Sets Strengthening;Right;1 set;10 reps  with 10 sec hold   Bridges Strengthening;Both;1 set;15 reps   Straight Leg Raise with External Rotation Strengthening;Right;1 set;15 reps   Other Supine Knee/Hip Exercises sit to stand x 10   Cryotherapy   Number Minutes Cryotherapy 10 Minutes   Cryotherapy Location Knee   Type of Cryotherapy Ice pack  Manual Therapy   Joint Mobilization patellar and Gr 2 into extension tol well   Soft tissue mobilization ROC blade over quads                PT Education - 01/30/15 1103    Education provided Yes   Education Details cross fx massage   Person(s) Educated Patient   Methods Explanation   Comprehension Verbalized understanding          PT Short Term Goals - 01/27/15 1347    PT SHORT TERM GOAL #1   Title Patient will demonstrate compliance with initial HEP including frequent ROM for flexion and ext.   Status Achieved   PT SHORT TERM GOAL #2   Title Increase right knee flexion to 90 degrees needed for greater ease getting in/out of the car.   Status Achieved   PT SHORT TERM GOAL #3   Title Right knee extension ROM  improved to 12 degrees needed for normalization of gait.     Status On-going   PT SHORT TERM GOAL #4   Title ambulate > 250' with LRAD modified independent for improved mobility (10/14/14)   Status Achieved   PT SHORT TERM GOAL #5   Title Left knee AROM 10-100 degrees needed for greater ease with sit to stand,.   Status On-going           PT Long Term Goals - 01/27/15 1348    PT LONG TERM GOAL #1   Title independent with advanced HEP needed for futher gains in ROM and strength in bilateral LEs.   Status On-going   PT LONG TERM GOAL #2   Title improve R knee AROM 8-95 for improved motion and mobility for playing with grandkids, going up and down steps to enter/exit home   Status On-going   PT LONG TERM GOAL #3   Title Patient will be able to ambulate >.22m/sec needed for community ambulation using cane.   Status On-going   PT LONG TERM GOAL #4   Title Right quad and HS strength 4-/5 needed for standing and ambulating longer periods of time and prepare for eventual return to golf.   Status On-going   PT LONG TERM GOAL #5   Title FOTO functional outcome score improved from 68% to 41% indicating improved function with less pain   Status Unable to assess   PT LONG TERM GOAL #6   Title Left knee AROM 7-105 degrees needed for improved mobility on curbs and stairs in the community and at home.   Status On-going               Plan - 01/30/15 1105    Clinical Impression Statement Christopher Rasmussen presents to therapy with report of no increased pain or soreness. He continues to dmeonstrate limited knee mobility at -13 to 91 which improved to 96 following mobilizations and instrument assisted STM. He tolerated all exercises well with no report of increased soreness or pain. Educated on cross friction massage to help with scar mobility.    PT Next Visit Plan Continue strength and manual cold stretching. Cont.  closed chain exercise   PT Home Exercise Plan cross friction massage or scar   Consulted  and Agree with Plan of Care Patient        Problem List Patient Active Problem List   Diagnosis Date Noted  . Osteoarthritis of right knee 12/19/2014  . 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/19/2014  . Arthrofibrosis of total knee arthroplasty, left 10/17/2014  .  Ileus, postoperative   . Acute renal failure with tubular necrosis   . Acute renal failure 08/23/2014  . Abdominal distension 08/23/2014  . Hyperkalemia 08/23/2014  . Arthritis of right knee 08/22/2014  . Status post total left knee replacement 08/22/2014  . Chest pain 02/22/2013  . HTN (hypertension) 02/22/2013  . HLD (hyperlipidemia) 02/22/2013  . Diabetes 02/22/2013   Lulu Riding PT, DPT, LAT, ATC  01/30/2015  11:10 AM   [facility] [address] [place], [place], [address] Phone: [phone]   Fax:  [phone]

## 2015-02-03 ENCOUNTER — Ambulatory Visit: Payer: Medicare PPO

## 2015-02-03 DIAGNOSIS — M25661 Stiffness of right knee, not elsewhere classified: Secondary | ICD-10-CM

## 2015-02-03 DIAGNOSIS — M25561 Pain in right knee: Secondary | ICD-10-CM

## 2015-02-03 DIAGNOSIS — R609 Edema, unspecified: Secondary | ICD-10-CM

## 2015-02-03 DIAGNOSIS — R29898 Other symptoms and signs involving the musculoskeletal system: Secondary | ICD-10-CM

## 2015-02-03 DIAGNOSIS — Z96651 Presence of right artificial knee joint: Secondary | ICD-10-CM | POA: Diagnosis not present

## 2015-02-03 NOTE — Patient Instructions (Signed)
We talked about progress and functional activity in future like golfing. I asked him to talk to surgeon to be cleared for activity  like golf.

## 2015-02-03 NOTE — Therapy (Signed)
[facility] [address] [place], [place], [address] Phone: [phone]   Fax:  [phone]  Physical Therapy Treatment  Patient Details  Name: Christopher Rasmussen MRN: [id number] Date of Birth: July 19, 1946 Referring Provider:  Aida PufferLittle, James, MD  Encounter Date: 02/03/2015      PT End of Session - 02/03/15 1323    Visit Number 8   Number of Visits 18   Date for PT Re-Evaluation 03/10/15   PT Start Time 1220   PT Stop Time 1315   PT Time Calculation (min) 55 min   Activity Tolerance Patient tolerated treatment well   Behavior During Therapy [facility] for tasks assessed/performed      Past Medical History  Diagnosis Date  . Hypertension   . High cholesterol     under control  . Type II diabetes mellitus   . Arthritis     Past Surgical History  Procedure Laterality Date  . Knee arthroscopy Left 1990's  . Total knee arthroplasty Left 08/22/2014    Procedure: LEFT TOTAL KNEE ARTHROPLASTY;  Surgeon: Kathryne Hitchhristopher Y Blackman, MD;  [facility];  Service: Orthopedics;  Laterality: Left;  . Knee closed reduction Left 10/17/2014    Procedure: CLOSED MANIPULATION LEFT KNEE UNDER ANESTHESIA;  Surgeon: Kathryne Hitchhristopher Y Blackman, MD;  [facility];  Service: Orthopedics;  Laterality: Left;  . Total knee arthroplasty Right 12/19/2014    Procedure: RIGHT TOTAL KNEE ARTHROPLASTY;  Surgeon: Kathryne Hitchhristopher Y Blackman, MD;  [facility];  Service: Orthopedics;  Laterality: Right;  . Knee closed reduction Left 12/19/2014    Procedure: CLOSED MANIPULATION  LEFT KNEE;  Surgeon: Kathryne Hitchhristopher Y Blackman, MD;  [facility];  Service: Orthopedics;  Laterality: Left;    There were no vitals filed for this visit.  Visit Diagnosis:  Right knee pain  Edema  Knee stiffness, right  Weakness of both lower extremities      Subjective Assessment - 02/03/15 1226    Subjective Pleased with progress compared to LT knee. No device for 3 weeks walking   Currently in Pain? Yes   Pain Score 2    Multiple Pain Sites No                         [facility] Adult PT Treatment/Exercise - 02/03/15 1229    Knee/Hip Exercises: Stretches   Passive Hamstring Stretch 3 reps;30 seconds;Right   Knee/Hip Exercises: Aerobic   Stationary Bike Nustep L5 8 min   Knee/Hip Exercises: Standing   Heel Raises Both;1 set;20 reps   Lateral Step Up Right;1 set;15 reps   Functional Squat 3 sets;10 reps;Other (comment)   Functional Squat Limitations Sit to stand with RT foot back varying the chair height to max effort Also worked on gluteal awareness with sit to stand to improve speed and efficiancy getting out of chair.    Knee/Hip Exercises: Seated   Long Arc Quad Strengthening;Right;1 set  25 reps 8 pounds   Knee/Hip Exercises: Supine   Quad Sets Strengthening;Right;1 set;10 reps   Quad Sets Limitations with towel roll and verbal and tactile cues   Bridges Strengthening;Both;1 set;15 reps   Straight Leg Raise with External Rotation Strengthening;Right;1 set;15 reps   Straight Leg Raise with External Rotation Limitations 2#   Patellar Mobs all directions Gr 3                  PT Short Term Goals - 02/03/15 1326    PT SHORT  TERM GOAL #3   Title Right knee extension ROM improved to 12 degrees needed for normalization of gait.     Status On-going           PT Long Term Goals - 02/03/15 1326    PT LONG TERM GOAL #1   Title independent with advanced HEP needed for futher gains in ROM and strength in bilateral LEs.   Status On-going   PT LONG TERM GOAL #2   Title improve R knee AROM 8-95 for improved motion and mobility for playing with grandkids, going up and down steps to enter/exit home   Status On-going   PT LONG TERM GOAL #4   Title Right quad and HS strength 4-/5 needed for standing and ambulating longer periods of time and prepare for eventual return to golf.   Status On-going   PT LONG TERM GOAL #5   Title FOTO functional  outcome score improved from 68% to 41% indicating improved function with less pain   Status On-going   PT LONG TERM GOAL #6   Title Left knee AROM 7-105 degrees needed for improved mobility on curbs and stairs in the community and at home.   Status On-going               Plan - 02/03/15 1324    Clinical Impression Statement Christopher Rasmussen continues to have stiffness of RT and LT knee but functionally he is doing very well walking without device and community mobility.  He cont with swelling  and some pain but this is managable. as he declined cold today   PT Next Visit Plan Continue strength and manual cold stretching. Cont.  closed chain exercise   Consulted and Agree with Plan of Care Patient        Problem List Patient Active Problem List   Diagnosis Date Noted  . Osteoarthritis of right knee 12/19/2014  . 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/19/2014  . Arthrofibrosis of total knee arthroplasty, left 10/17/2014  . Ileus, postoperative   . Acute renal failure with tubular necrosis   . Acute renal failure 08/23/2014  . Abdominal distension 08/23/2014  . Hyperkalemia 08/23/2014  . Arthritis of right knee 08/22/2014  . Status post total left knee replacement 08/22/2014  . Chest pain 02/22/2013  . HTN (hypertension) 02/22/2013  . HLD (hyperlipidemia) 02/22/2013  . Diabetes 02/22/2013    Caprice Red PT 02/03/2015, 1:29 PM  [facility] [address] [place], [place], [address] Phone: [phone]   Fax:  [phone]

## 2015-02-06 ENCOUNTER — Ambulatory Visit: Payer: Medicare PPO | Attending: Orthopaedic Surgery

## 2015-02-06 DIAGNOSIS — M25661 Stiffness of right knee, not elsewhere classified: Secondary | ICD-10-CM | POA: Insufficient documentation

## 2015-02-06 DIAGNOSIS — R29898 Other symptoms and signs involving the musculoskeletal system: Secondary | ICD-10-CM | POA: Insufficient documentation

## 2015-02-06 DIAGNOSIS — M25561 Pain in right knee: Secondary | ICD-10-CM | POA: Insufficient documentation

## 2015-02-06 DIAGNOSIS — R609 Edema, unspecified: Secondary | ICD-10-CM | POA: Diagnosis present

## 2015-02-06 NOTE — Therapy (Signed)
[facility] [address] [place], [place], [address] Phone: [phone]   Fax:  [phone]  Physical Therapy Treatment  Patient Details  Name: Christopher Rasmussen MRN: [id number] Date of Birth: Mar 22, 1946 Referring Provider:  Aida Puffer, MD  Encounter Date: 02/06/2015      PT End of Session - 02/06/15 1106    Visit Number 9   Number of Visits 12   Date for PT Re-Evaluation 02/27/15   PT Start Time 1015   PT Stop Time 1110   PT Time Calculation (min) 55 min   Activity Tolerance Patient tolerated treatment well   Behavior During Therapy [facility] for tasks assessed/performed      Past Medical History  Diagnosis Date  . Hypertension   . High cholesterol     under control  . Type II diabetes mellitus   . Arthritis     Past Surgical History  Procedure Laterality Date  . Knee arthroscopy Left 1990's  . Total knee arthroplasty Left 08/22/2014    Procedure: LEFT TOTAL KNEE ARTHROPLASTY;  Surgeon: Kathryne Hitch, MD;  [facility];  Service: Orthopedics;  Laterality: Left;  . Knee closed reduction Left 10/17/2014    Procedure: CLOSED MANIPULATION LEFT KNEE UNDER ANESTHESIA;  Surgeon: Kathryne Hitch, MD;  [facility];  Service: Orthopedics;  Laterality: Left;  . Total knee arthroplasty Right 12/19/2014    Procedure: RIGHT TOTAL KNEE ARTHROPLASTY;  Surgeon: Kathryne Hitch, MD;  [facility];  Service: Orthopedics;  Laterality: Right;  . Knee closed reduction Left 12/19/2014    Procedure: CLOSED MANIPULATION  LEFT KNEE;  Surgeon: Kathryne Hitch, MD;  [facility];  Service: Orthopedics;  Laterality: Left;    There were no vitals filed for this visit.  Visit Diagnosis:  Right knee pain  Edema  Knee stiffness, right  Weakness of both lower extremities          [facility] PT Assessment - 02/06/15 1020    AROM   Right Knee Extension -15   Right Knee Flexion 95  100 degrees passive                     [facility] Adult PT Treatment/Exercise - 02/06/15 1020    Ambulation/Gait   Stairs Yes   Stairs Assistance 6: Modified independent (Device/Increase time)   Stair Management Technique One rail Right;Alternating pattern;Step to pattern;One rail Left;Two rails;Other (comment)   Number of Stairs 16   Height of Stairs 6   Gait Comments We did variety for hand support adn he needed tmore UE with descent than ascent. ,  We also walked uneventerrian on grass and slopes , up /down curbs without problem, Also walked forward/back , sideway LT and RT and march all x15-20 reps with supervision   Knee/Hip Exercises: Aerobic   Stationary Bike Nustep L5 8 min Legs only   Knee/Hip Exercises: Seated   Long Arc Quad Strengthening;Right;1 set   [employer] Weight 8 lbs.  25 reps   Knee/Hip Exercises: Supine   Terminal Knee Extension Strengthening;Right;1 set   Terminal Knee Extension Limitations with knee on soft ball x25 with SAQ   Cryotherapy   Number Minutes Cryotherapy 10 Minutes   Cryotherapy Location Knee   Type of Cryotherapy Ice pack   Manual Therapy   Joint Mobilization AP glides tibia Gr 4 5 bouts of 25 reps   Passive ROM Emphasis on flexion today end range oscillations and overpressure  PT Short Term Goals - 02/03/15 1326    PT SHORT TERM GOAL #3   Title Right knee extension ROM improved to 12 degrees needed for normalization of gait.     Status On-going           PT Long Term Goals - 02/03/15 1326    PT LONG TERM GOAL #1   Title independent with advanced HEP needed for futher gains in ROM and strength in bilateral LEs.   Status On-going   PT LONG TERM GOAL #2   Title improve R knee AROM 8-95 for improved motion and mobility for playing with grandkids, going up and down steps to enter/exit home   Status On-going   PT LONG TERM GOAL #4   Title Right quad and HS strength 4-/5 needed for standing and ambulating longer periods of  time and prepare for eventual return to golf.   Status On-going   PT LONG TERM GOAL #5   Title FOTO functional outcome score improved from 68% to 41% indicating improved function with less pain   Status On-going   PT LONG TERM GOAL #6   Title Left knee AROM 7-105 degrees needed for improved mobility on curbs and stairs in the community and at home.   Status On-going               Plan - 02/06/15 1106    Clinical Impression Statement Christopher Rasmussen is pleased with his progress. He is independent in moderate uneven terrain and with rails is safe on steps though descent is painful RT knee and due to decreased range he must be careful . Marland Kitchen. He should be ready for discharge in next 2-3 visits  and he agrees   PT Next Visit Plan Continue strength and manual cold stretching. Cont.  closed chain exercise    FOTO next        Problem List Patient Active Problem List   Diagnosis Date Noted  . Osteoarthritis of right knee 12/19/2014  . 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/19/2014  . Arthrofibrosis of total knee arthroplasty, left 10/17/2014  . Ileus, postoperative   . Acute renal failure with tubular necrosis   . Acute renal failure 08/23/2014  . Abdominal distension 08/23/2014  . Hyperkalemia 08/23/2014  . Arthritis of right knee 08/22/2014  . Status post total left knee replacement 08/22/2014  . Chest pain 02/22/2013  . HTN (hypertension) 02/22/2013  . HLD (hyperlipidemia) 02/22/2013  . Diabetes 02/22/2013    Caprice Redhasse, Diedra Sinor M PT 02/06/2015, 12:22 PM  [facility] [address] [place], [place], [address] Phone: [phone]   Fax:  [phone]

## 2015-02-10 ENCOUNTER — Ambulatory Visit: Payer: Medicare PPO

## 2015-02-10 DIAGNOSIS — M25661 Stiffness of right knee, not elsewhere classified: Secondary | ICD-10-CM

## 2015-02-10 DIAGNOSIS — M25561 Pain in right knee: Secondary | ICD-10-CM

## 2015-02-10 DIAGNOSIS — R29898 Other symptoms and signs involving the musculoskeletal system: Secondary | ICD-10-CM

## 2015-02-10 NOTE — Therapy (Signed)
[facility] [address] [place], [place], [address] Phone: [phone]   Fax:  [phone]  Physical Therapy Treatment  Patient Details  Name: Christopher MightyJohn B Jurewicz MRN: [id number] Date of Birth: 12-11-1945 Referring Provider:  Aida PufferLittle, James, MD  Encounter Date: 02/10/2015      PT End of Session - 02/10/15 1155    Visit Number 10   Number of Visits 12   Date for PT Re-Evaluation 02/27/15   PT Start Time 1100   PT Stop Time 1145   PT Time Calculation (min) 45 min   Activity Tolerance Patient tolerated treatment well   Behavior During Therapy [facility] for tasks assessed/performed      Past Medical History  Diagnosis Date  . Hypertension   . High cholesterol     under control  . Type II diabetes mellitus   . Arthritis     Past Surgical History  Procedure Laterality Date  . Knee arthroscopy Left 1990's  . Total knee arthroplasty Left 08/22/2014    Procedure: LEFT TOTAL KNEE ARTHROPLASTY;  Surgeon: Kathryne Hitchhristopher Y Blackman, MD;  [facility];  Service: Orthopedics;  Laterality: Left;  . Knee closed reduction Left 10/17/2014    Procedure: CLOSED MANIPULATION LEFT KNEE UNDER ANESTHESIA;  Surgeon: Kathryne Hitchhristopher Y Blackman, MD;  [facility];  Service: Orthopedics;  Laterality: Left;  . Total knee arthroplasty Right 12/19/2014    Procedure: RIGHT TOTAL KNEE ARTHROPLASTY;  Surgeon: Kathryne Hitchhristopher Y Blackman, MD;  [facility];  Service: Orthopedics;  Laterality: Right;  . Knee closed reduction Left 12/19/2014    Procedure: CLOSED MANIPULATION  LEFT KNEE;  Surgeon: Kathryne Hitchhristopher Y Blackman, MD;  [facility];  Service: Orthopedics;  Laterality: Left;    There were no vitals filed for this visit.  Visit Diagnosis:  Right knee pain  Knee stiffness, right  Weakness of both lower extremities      Subjective Assessment - 02/10/15 1124    Subjective Doing well.    Currently in Pain? Yes   Pain Score 2    Multiple Pain Sites No                         [facility] Adult PT Treatment/Exercise - 02/10/15 1128    Knee/Hip Exercises: Standing   Heel Raises Both;1 set;20 reps   Forward Step Up Right;Left;2 sets;20 reps;Hand Hold: 1;Step Height: 4"   Functional Squat 3 sets;10 reps;Other (comment)   Functional Squat Limitations Sit to stand with RT foot back varying the chair height to max effort Also worked on gluteal awareness with sit to stand to improve speed and efficiancy getting out of chair.    Knee/Hip Exercises: Seated   Long Arc Quad Strengthening;Right;1 set   Con-wayLong Arc Quad Weight 8 lbs.   Knee/Hip Exercises: Supine   Bridges Strengthening;Both;1 set;15 reps     Nustep L5 8 min  Legs only             PT Short Term Goals - 02/03/15 1326    PT SHORT TERM GOAL #3   Title Right knee extension ROM improved to 12 degrees needed for normalization of gait.     Status On-going           PT Long Term Goals - 02/03/15 1326    PT LONG TERM GOAL #1   Title independent with advanced HEP needed for futher gains in ROM and strength in bilateral LEs.   Status On-going  PT LONG TERM GOAL #2   Title improve R knee AROM 8-95 for improved motion and mobility for playing with grandkids, going up and down steps to enter/exit home   Status On-going   PT LONG TERM GOAL #4   Title Right quad and HS strength 4-/5 needed for standing and ambulating longer periods of time and prepare for eventual return to golf.   Status On-going   PT LONG TERM GOAL #5   Title FOTO functional outcome score improved from 68% to 41% indicating improved function with less pain   Status On-going   PT LONG TERM GOAL #6   Title Left knee AROM 7-105 degrees needed for improved mobility on curbs and stairs in the community and at home.   Status On-going               Plan - 02/10/15 1156    Clinical Impression Statement He will be ready for discharge next visit.    PT Next Visit Plan Continue strength and manual  cold stretching. Cont.  closed chain exercise    FOTO next        Problem List Patient Active Problem List   Diagnosis Date Noted  . Osteoarthritis of right knee 12/19/2014  . 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/19/2014  . Arthrofibrosis of total knee arthroplasty, left 10/17/2014  . Ileus, postoperative   . Acute renal failure with tubular necrosis   . Acute renal failure 08/23/2014  . Abdominal distension 08/23/2014  . Hyperkalemia 08/23/2014  . Arthritis of right knee 08/22/2014  . Status post total left knee replacement 08/22/2014  . Chest pain 02/22/2013  . HTN (hypertension) 02/22/2013  . HLD (hyperlipidemia) 02/22/2013  . Diabetes 02/22/2013    Caprice Red PT 02/10/2015, 11:57 AM  [facility] [address] [place], [place], [address] Phone: [phone]   Fax:  [phone]

## 2015-02-12 ENCOUNTER — Ambulatory Visit: Payer: Medicare PPO

## 2015-02-12 DIAGNOSIS — R29898 Other symptoms and signs involving the musculoskeletal system: Secondary | ICD-10-CM

## 2015-02-12 DIAGNOSIS — M25561 Pain in right knee: Secondary | ICD-10-CM | POA: Diagnosis not present

## 2015-02-12 DIAGNOSIS — M25661 Stiffness of right knee, not elsewhere classified: Secondary | ICD-10-CM

## 2015-02-12 NOTE — Therapy (Addendum)
[place], [place], [address] Phone: [phone]   Fax:  [phone]  Physical Therapy Evaluation  Patient Details  Name: Christopher Rasmussen MRN: [id number] Date of Birth: 1946/01/23 Referring Provider:  Tamsen Roers, MD  Encounter Date: 02/12/2015      PT End of Session - 02/12/15 1146    Visit Number 11   PT Start Time 1055   PT Stop Time 1140   PT Time Calculation (min) 45 min   Behavior During Therapy [facility] for tasks assessed/performed      Past Medical History  Diagnosis Date  . Hypertension   . High cholesterol     under control  . Type II diabetes mellitus   . Arthritis     Past Surgical History  Procedure Laterality Date  . Knee arthroscopy Left 1990's  . Total knee arthroplasty Left 08/22/2014    Procedure: LEFT TOTAL KNEE ARTHROPLASTY;  Surgeon: Mcarthur Rossetti, MD;  Location: [facility];  Service: Orthopedics;  Laterality: Left;  . Knee closed reduction Left 10/17/2014    Procedure: CLOSED MANIPULATION LEFT KNEE UNDER ANESTHESIA;  Surgeon: Mcarthur Rossetti, MD;  Location: [facility];  Service: Orthopedics;  Laterality: Left;  . Total knee arthroplasty Right 12/19/2014    Procedure: RIGHT TOTAL KNEE ARTHROPLASTY;  Surgeon: Mcarthur Rossetti, MD;  Location: [facility];  Service: Orthopedics;  Laterality: Right;  . Knee closed reduction Left 12/19/2014    Procedure: CLOSED MANIPULATION  LEFT KNEE;  Surgeon: Mcarthur Rossetti, MD;  Location: [facility];  Service: Orthopedics;  Laterality: Left;    There were no vitals filed for this visit.  Visit Diagnosis:  Right knee pain  Knee stiffness, right  Weakness of both lower extremities          [facility] PT Assessment - 02/12/15 1130    AROM   Right Knee Extension -15   Right Knee Flexion 102   Strength   Right Knee Flexion 5/5   Right Knee Extension 5/5   Left Knee Flexion 5/5   Left Knee Extension 5/5   Ambulation/Gait   Gait  Comments Independent in home and communnity without device.decreased flexion of knee with swing phase.                    [facility] Adult PT Treatment/Exercise - 02/12/15 1130    Knee/Hip Exercises: Standing   Functional Squat 3 sets;10 reps;Other (comment)   Functional Squat Limitations Sit to stand with RT foot back varying the chair height to max effort Also worked on gluteal awareness with sit to stand to improve speed and efficiancy getting out of chair.    Other Standing Knee Exercises side steps with green band on ankles x 25 RT and LT.    Knee/Hip Exercises: Seated   Long Arc Quad Strengthening;Right;1 set  30 reps 5 sec hold   Long Arc Quad Weight 8 lbs.                  PT Short Term Goals - 02/12/15 1148    PT SHORT TERM GOAL #3   Title Right knee extension ROM improved to 12 degrees needed for normalization of gait.     Baseline 15   Status Partially Met   PT SHORT TERM GOAL #4   Title ambulate > 250' with LRAD modified independent for improved mobility (10/14/14)   Status Achieved   PT SHORT TERM GOAL #5   Title Left knee  AROM 10-100 degrees needed for greater ease with sit to stand,.   Baseline 100 degrees flexion   Status Partially Met           PT Long Term Goals - 15-Feb-2015 1148    PT LONG TERM GOAL #1   Title independent with advanced HEP needed for futher gains in ROM and strength in bilateral LEs.   Status Achieved   PT LONG TERM GOAL #2   Title improve R knee AROM 8-95 for improved motion and mobility for playing with grandkids, going up and down steps to enter/exit home   Baseline 15-100 degrees   Status Partially Met   PT LONG TERM GOAL #3   Title Patient will be able to ambulate >.45msec needed for community ambulation using cane.   Baseline 1.168m   Status Achieved   PT LONG TERM GOAL #4   Title Right quad and HS strength 4-/5 needed for standing and ambulating longer periods of time and prepare for eventual return to golf.    Baseline 5/5   Status Achieved   PT LONG TERM GOAL #5   Title FOTO functional outcome score improved from 68% to 41% indicating improved function with less pain   Baseline 28% on discharge   Status Achieved   PT LONG TERM GOAL #6   Title Left knee AROM 7-105 degrees needed for improved mobility on curbs and stairs in the community and at home.   Baseline 15-100    Status Not Met               Plan - [id number]    Clinical Impression Statement [name] he is doing well and opted for discharge today. He is well please with function and genrally has little to no pain. He reports he will do his HEP   PT Next Visit Plan Discharge today          G-Codes - [id number]    Functional Assessment Tool Used clinical judgement; FOTO   Functional Limitation Mobility: Walking and moving around   Mobility: Walking and Moving Around Goal Status ([id number] least 40 percent but less than 60 percent impaired, limited or restricted   Mobility: Walking and Moving Around Discharge Status ([phone] least 20 percent but less than 40 percent impaired, limited or restricted       Problem List Patient Active Problem List   Diagnosis Date Noted  . Osteoarthritis of right knee 12/19/2014  . 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/19/2014  . Arthrofibrosis of total knee arthroplasty, left 10/17/2014  . Ileus, postoperative   . Acute renal failure with tubular necrosis   . Acute renal failure 08/23/2014  . Abdominal distension 08/23/2014  . Hyperkalemia 08/23/2014  . Arthritis of right knee 08/22/2014  . Status post total left knee replacement 08/22/2014  . Chest pain 02/22/2013  . HTN (hypertension) 02/22/2013  . HLD (hyperlipidemia) 02/22/2013  . Diabetes 02/22/2013    ChDarrel HooverT 7/07-05-1610:57 AM  [facility] [id number]: [phone] Fax:  [phone]  PHYSICAL  THERAPY DISCHARGE SUMMARY  Visits from Start of Care: 11  Current functional level related to goals / functional outcomes: See above   Remaining deficits: See above   Education / Equipment: HEP Plan: Patient agrees to discharge.  Patient goals were partially met. Patient is being discharged due to being pleased with the current functional level.  ?????  Lillette Boxer Fenix Ruppe   PT   06/30/15                      9:28 AM

## 2015-12-25 ENCOUNTER — Other Ambulatory Visit: Payer: Self-pay | Admitting: Family Medicine

## 2015-12-25 ENCOUNTER — Ambulatory Visit
Admission: RE | Admit: 2015-12-25 | Discharge: 2015-12-25 | Disposition: A | Discharge status: Home / Self Care | Payer: Medicare Other | Source: Ambulatory Visit | Attending: Family Medicine | Admitting: Family Medicine

## 2015-12-25 DIAGNOSIS — I159 Secondary hypertension, unspecified: Secondary | ICD-10-CM

## 2016-09-04 IMAGING — CR DG CHEST 2V
2 series · 2 of 2 positions shown · non-contrast
Comparison: 08/23/2014

CLINICAL DATA: Secondary hypertension, unspecified, diabetes
mellitus

EXAM:
CHEST  2 VIEW

[w chest pa]
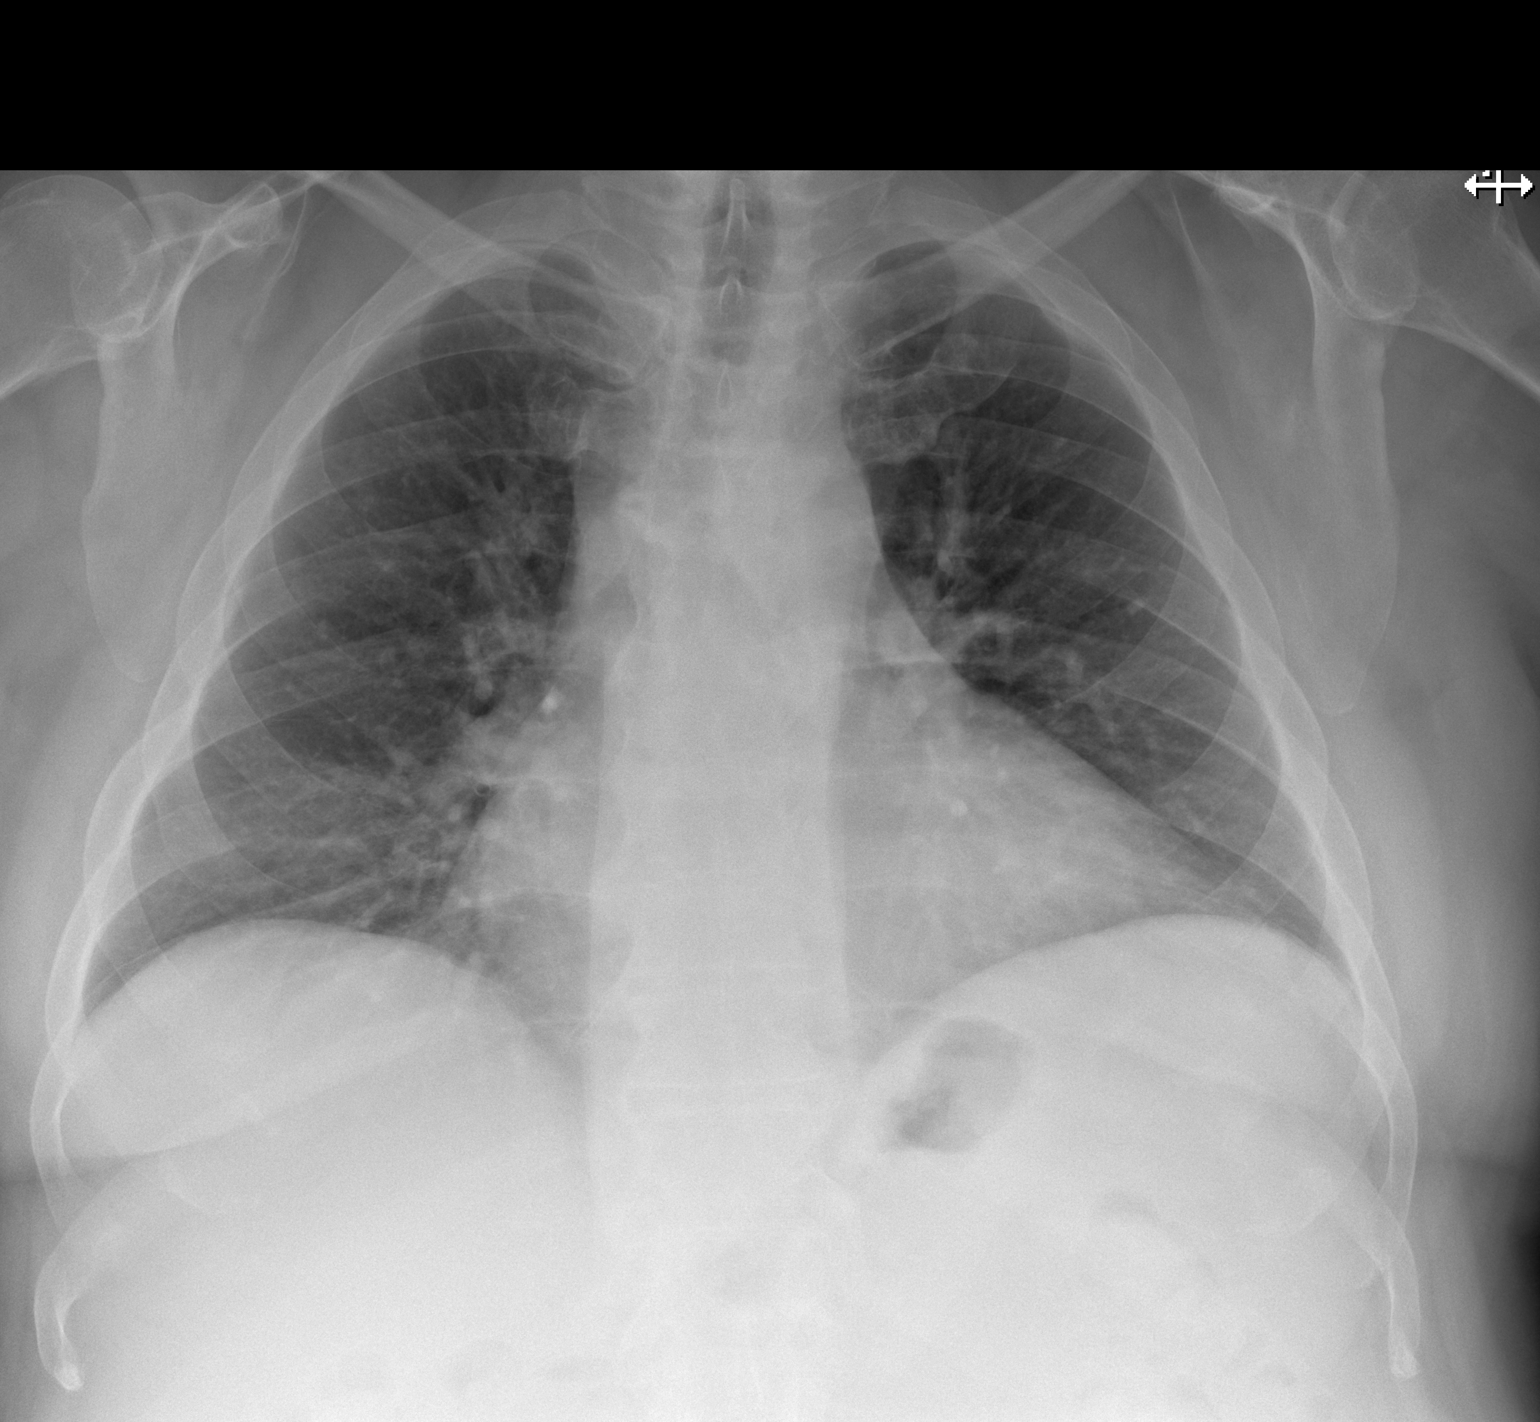

[w chest lat]
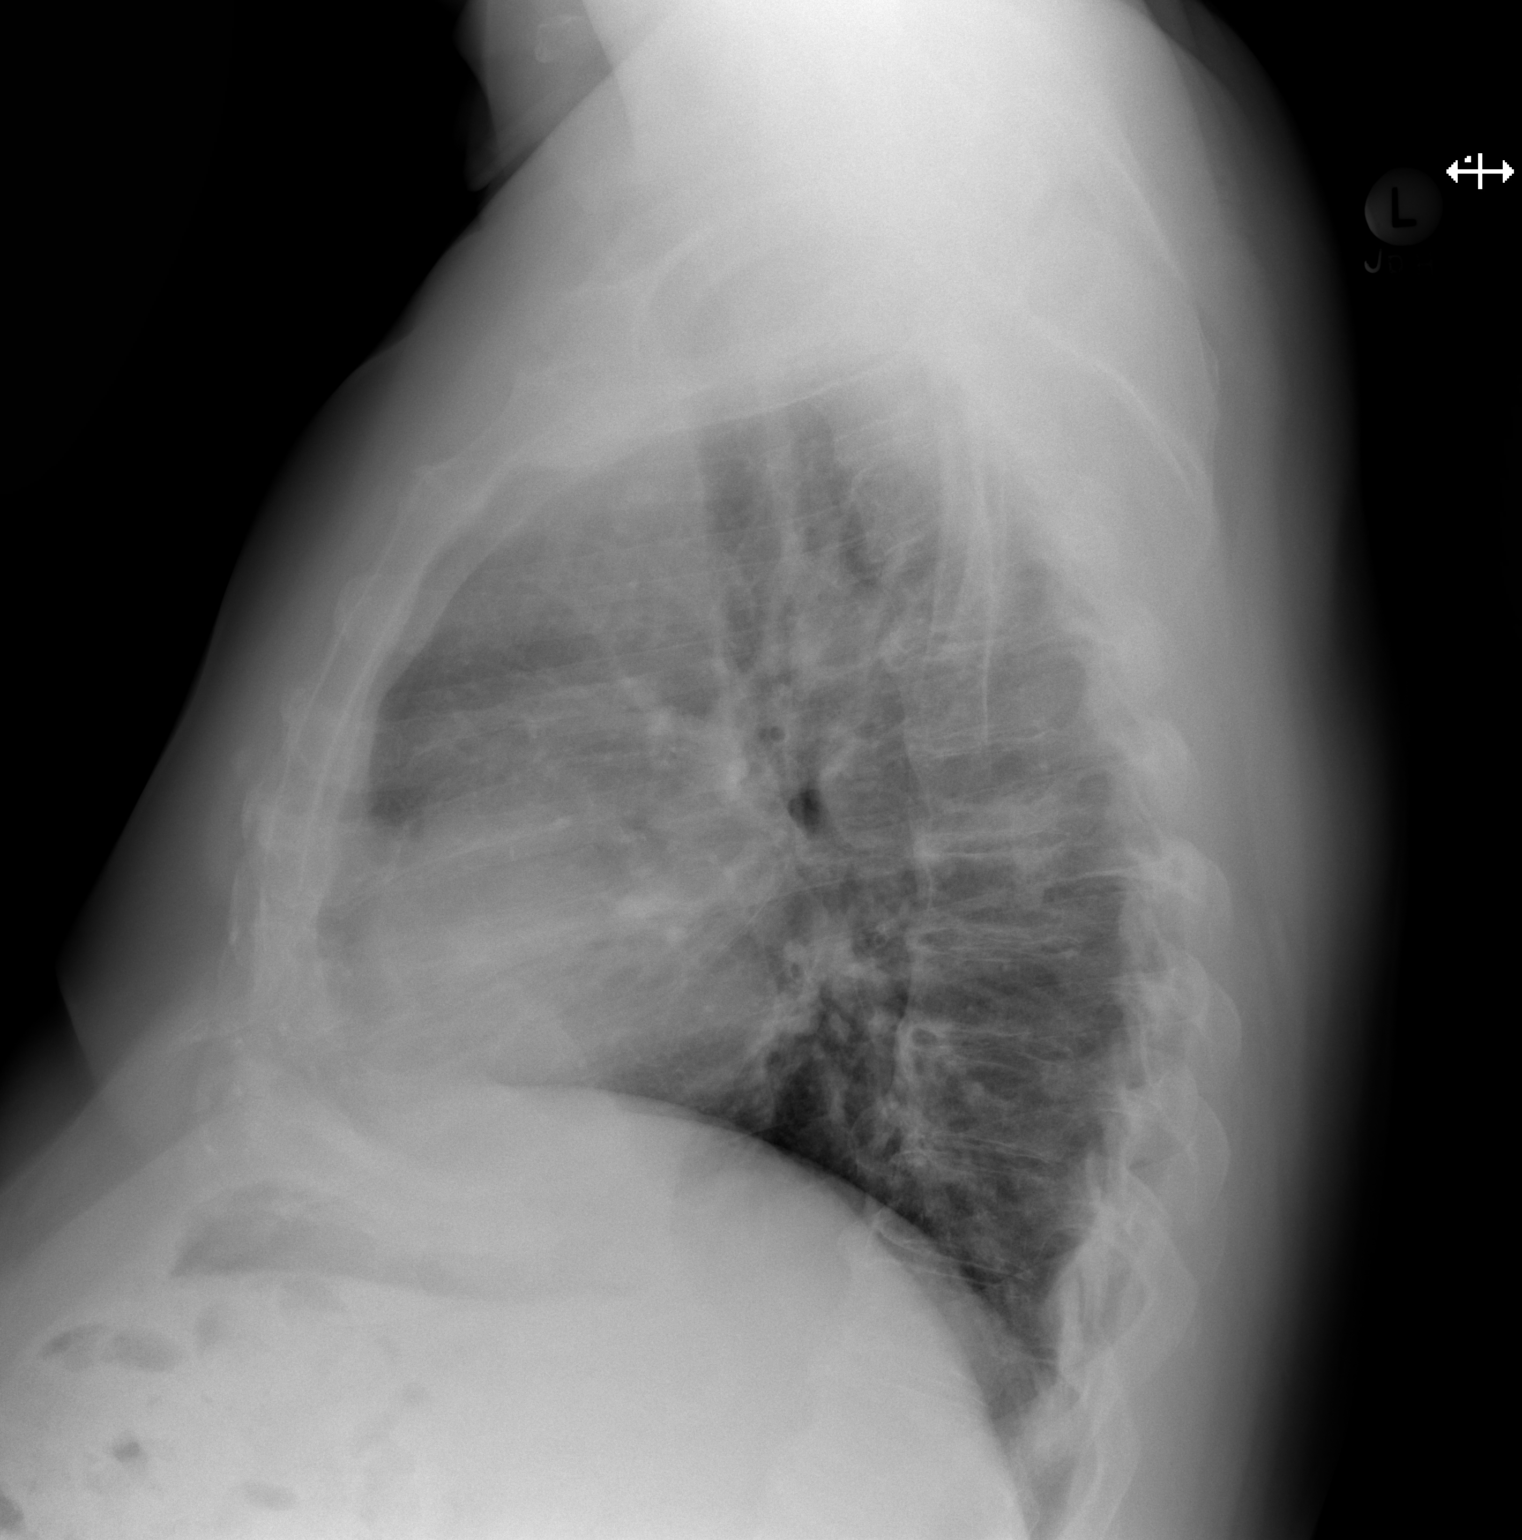

[2 of 2 positions shown; findings below may reference images not displayed]

FINDINGS: Enlargement of cardiac silhouette with pulmonary vascular
congestion.

Mediastinal contours normal.

Mild RIGHT basilar atelectasis.

Lungs otherwise clear.

No infiltrate, pleural effusion or pneumothorax.

Bones unremarkable.
IMPRESSION: Enlargement of cardiac silhouette with pulmonary vascular
congestion.

Mild RIGHT basilar atelectasis.

## 2018-09-20 ENCOUNTER — Other Ambulatory Visit: Payer: Self-pay | Admitting: Cardiology

## 2018-09-20 DIAGNOSIS — I6521 Occlusion and stenosis of right carotid artery: Secondary | ICD-10-CM

## 2018-10-04 ENCOUNTER — Telehealth: Payer: Self-pay

## 2018-10-04 ENCOUNTER — Ambulatory Visit: Payer: Medicare Other | Admitting: Cardiology

## 2018-10-04 ENCOUNTER — Encounter: Payer: Self-pay | Admitting: Cardiology

## 2018-10-04 VITALS — BP 162/81 | HR 86 | Ht 67.0 in | Wt 238.0 lb

## 2018-10-04 DIAGNOSIS — I6521 Occlusion and stenosis of right carotid artery: Secondary | ICD-10-CM | POA: Diagnosis not present

## 2018-10-04 DIAGNOSIS — I714 Abdominal aortic aneurysm, without rupture, unspecified: Secondary | ICD-10-CM

## 2018-10-04 DIAGNOSIS — E1165 Type 2 diabetes mellitus with hyperglycemia: Secondary | ICD-10-CM

## 2018-10-04 DIAGNOSIS — E668 Other obesity: Secondary | ICD-10-CM

## 2018-10-04 DIAGNOSIS — I10 Essential (primary) hypertension: Secondary | ICD-10-CM

## 2018-10-04 DIAGNOSIS — E782 Mixed hyperlipidemia: Secondary | ICD-10-CM

## 2018-10-04 HISTORY — DX: Abdominal aortic aneurysm, without rupture: I71.4

## 2018-10-04 HISTORY — DX: Occlusion and stenosis of right carotid artery: I65.21

## 2018-10-04 HISTORY — DX: Abdominal aortic aneurysm, without rupture, unspecified: I71.40

## 2018-10-04 MED ORDER — CLONIDINE 0.2 MG/24HR TD PTWK: 0.2000 mg | MEDICATED_PATCH | TRANSDERMAL | 12 refills | Status: DC

## 2018-10-04 NOTE — Progress Notes (Signed)
Subjective:  Primary Physician:  Tamsen Roers, MD  Patient ID: Christopher Rasmussen, male    DOB: Sep 22, 1945, 73 y.o.   MRN: [id number]  Chief Complaint  Patient presents with  . Aortic Stenosis    6 month f/u     HPI: Christopher Rasmussen  is a 73 y.o. male  with  mild aortic stenosis, controlled diabetes mellitus, moderate obesity, hypertension and hyperlipidemia presents here for 6 month office visit. He is asymptomatic right carotid stenosis and a very small abdominal aortic aneurysm as well.  He presents here for annual visit, states that he is been doing well, about 2 weeks ago he started exercising, walks about 1 mile and takes about 27 minutes to do same but denies associated chest pain or shortness of breath.  No PND or orthopnea, no leg edema.  Past Medical History:  Diagnosis Date  . Abdominal aortic aneurysm (AAA) ([place])   . Abdominal aortic aneurysm (AAA) 30 to 34 mm in diameter ([place]) 10/04/2018  . Aortic stenosis   . Arthritis   . Asymptomatic stenosis of right carotid artery 10/04/2018  . Carotid stenosis   . Heart block   . High cholesterol    under control  . Hypertension   . Type II diabetes mellitus ([place])     Past Surgical History:  Procedure Laterality Date  . KNEE ARTHROSCOPY Left 1990's  . KNEE CLOSED REDUCTION Left 10/17/2014   Procedure: CLOSED MANIPULATION LEFT KNEE UNDER ANESTHESIA;  Surgeon: Mcarthur Rossetti, MD;  [facility]: [facility];  Service: Orthopedics;  Laterality: Left;  . KNEE CLOSED REDUCTION Left 12/19/2014   Procedure: CLOSED MANIPULATION  LEFT KNEE;  Surgeon: Mcarthur Rossetti, MD;  Location: [facility];  Service: Orthopedics;  Laterality: Left;  . 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   Procedure: LEFT TOTAL KNEE ARTHROPLASTY;  Surgeon: Mcarthur Rossetti, MD;  Location: [facility];  Service: Orthopedics;  Laterality: Left;  . TOTAL KNEE ARTHROPLASTY Right 12/19/2014   Procedure: RIGHT TOTAL KNEE ARTHROPLASTY;  Surgeon: Mcarthur Rossetti, MD;   Location: [facility];  Service: Orthopedics;  Laterality: Right;    Social History   Socioeconomic History  . Marital status: Married    Spouse name: Not on file  . Number of children: 2  . Years of education: Not on file  . Highest education level: Not on file  Occupational History  . Not on file  Social Needs  . Financial resource strain: Not on file  . Food insecurity:    Worry: Not on file    Inability: Not on file  . Transportation needs:    Medical: Not on file    Non-medical: Not on file  Tobacco Use  . Smoking status: Never Smoker  . Smokeless tobacco: Never Used  Substance and Sexual Activity  . Alcohol use: No  . Drug use: No  . Sexual activity: Never  Lifestyle  . Physical activity:    Days per week: Not on file    Minutes per session: Not on file  . Stress: Not on file  Relationships  . Social connections:    Talks on phone: Not on file    Gets together: Not on file    Attends religious service: Not on file    Active member of club or organization: Not on file    Attends meetings of clubs or organizations: Not on file    Relationship status: Not on file  . Intimate partner violence:    Fear  of current or ex partner: Not on file    Emotionally abused: Not on file    Physically abused: Not on file    Forced sexual activity: Not on file  Other Topics Concern  . Not on file  Social History Narrative  . Not on file    Current Outpatient Medications on File Prior to Visit  Medication Sig Dispense Refill  . amLODipine (NORVASC) 10 MG tablet Take 10 mg by mouth every morning.     Marland Kitchen aspirin EC 325 MG EC tablet Take 1 tablet (325 mg total) by mouth 2 (two) times daily after a meal. 30 tablet 0  . insulin aspart (NOVOLOG) 100 UNIT/ML injection Inject 10 Units into the skin daily with lunch.    . insulin aspart protamine- aspart (NOVOLOG MIX 70/30) (70-30) 100 UNIT/ML injection Inject 30 Units into the skin every morning.    . metFORMIN (GLUCOPHAGE) 1000 MG  tablet Take 1,000 mg by mouth 2 (two) times daily with a meal.    . metoprolol succinate (TOPROL-XL) 50 MG 24 hr tablet Take 50 mg by mouth at bedtime. Take with or immediately following a meal.    . simvastatin (ZOCOR) 10 MG tablet Take 10 mg by mouth at bedtime.    . TRULICITY 1.5 NO/7.0JG SOPN Inject 1.5 mg into the skin once a week.    . valsartan-hydrochlorothiazide (DIOVAN-HCT) 320-12.5 MG per tablet Take 1 tablet by mouth daily.     No current facility-administered medications on file prior to visit.      Review of Systems  Constitutional: Negative for malaise/fatigue and weight loss.  Respiratory: Negative for cough, hemoptysis and shortness of breath.   Cardiovascular: Negative for chest pain, palpitations, claudication and leg swelling.  Gastrointestinal: Negative for abdominal pain, blood in stool, constipation, heartburn and vomiting.  Genitourinary: Negative for dysuria.  Musculoskeletal: Positive for joint pain. Negative for myalgias.  Neurological: Negative for dizziness, focal weakness and headaches.  Endo/Heme/Allergies: Does not bruise/bleed easily.  Psychiatric/Behavioral: Negative for depression. The patient is not nervous/anxious.   All other systems reviewed and are negative.      Objective:  Blood pressure (!) 162/81, pulse 86, height 5' 7"  (1.702 m), weight 238 lb (108 kg), SpO2 97 %. Body mass index is 37.28 kg/m.  Physical Exam  Constitutional: He appears well-developed and well-nourished. No distress.  HENT:  Head: Atraumatic.  Eyes: Conjunctivae are normal.  Neck: Neck supple. No JVD present. No thyromegaly present.  Cardiovascular: Normal rate, regular rhythm and intact distal pulses. Exam reveals no gallop.  Murmur heard.  Harsh early systolic murmur is present with a grade of 3/6 at the upper right sternal border radiating to the neck. Fem and pop pulse difficult to feel due to body habitus Pulses:      Carotid pulses are 2+ on the right side with  bruit and 2+ on the left side with bruit.      Radial pulses are 2+ on the right side and 2+ on the left side.       Dorsalis pedis pulses are 2+ on the right side and 2+ on the left side.       Posterior tibial pulses are 2+ on the right side and 2+ on the left side.  Pulmonary/Chest: Effort normal and breath sounds normal.  Abdominal: Soft. Bowel sounds are normal.  obese  Musculoskeletal: Normal range of motion.        General: No edema.  Neurological: He is alert.  Skin: Skin  is warm and dry.  Psychiatric: He has a normal mood and affect.    CARDIAC STUDIES:  Exercise Myoview stress test 03/04/2013: 1. Resting EKG showed normal sinus rhythm, poor R wave progression, pulmonary disease pattern.  Patient exercised on Bruce protocol for 4 minutes 31 sec. The maximum work level achieved was 7.3 MET's.  The test was terminated due to achievement of the target heart rate. 2. Perfusion imaging studies demonstrate mild diaphragmatic attenuation artifact in the inferior wall.  There was no evidence of ischemia or infarct. Dynamic gated images reveal normal wall motion and endocardial thickening. Left ventricular ejection fraction was estimated to be 58%. This is a low risk stress test.  Carotid artery duplex 05/01/2018: Stenosis in the right internal carotid artery (50-69%), lower end of spectrum. Antegrade right vertebral artery flow. Left vertebral artery flow is not visualized. Compared to the study done on 10/26/2016, no significant change.  Left vertebral flow not visualized again.  Abdominal aortic duplex 01/19/2017: A distal abdominal aortic aneurysm measuring 2.92  x 3.06  x 3.08  cm is seen. Focal plaque noted in the distal aorta. F/u in 5 years recommended if clinically indicated.   Assessment & Recommendations:   1. Asymptomatic stenosis of right carotid artery EKG 10/04/2018: Sinus rhythm with first-degree AV block at the rate of 86 bpm, left atrial abnormality, left axis  deviation.  Normal QT interval, no evidence of ischemia.  2. Abdominal aortic aneurysm (AAA) 30 to 34 mm in diameter ([place])  3. Moderate obesity  4. Essential hypertension  5. Un controlled DM  4. Laboratory exam 03/28/2018: Cholesterol 112, triglycerides 65, HDL 34, LDL 64.  Creatinine 0.9, EGFR 85/99, potassium 4.1, bilirubin 1.4, CMP otherwise normal.  Hemoglobin A1c 7.3%.  TSH normal.  CBC normal.  Iron panel normal.  Vitamin D normal.   Recommendation:  Patient is here on annual visit and follow-up of asymptomatic carotid stenosis, AAA, hypertension.  Patient has uncontrolled diabetes mellitus and has gotten worse recently, related to his poor eating habits.  We have discussed at length regarding increasing his physical activity and also to make lifestyle changes. His lipids are well controlled, he is in a very small dose of statin continue the same.  With regard to hypertension, I have added clonidine patch 0.2 mg q.1 week.  I'll like to see him back in 6 weeks for follow-up.  He has carotid artery duplex scheduled for surveillance and also I'll set him up for abdominal aortic duplex for evaluation of small AAA.  Adrian Prows, MD, [facility] 10/04/2018, 10:14 AM [facility] [place] Pager: [phone] Office: [phone] If no answer Cell [phone]

## 2018-10-30 ENCOUNTER — Other Ambulatory Visit: Payer: Medicare Other

## 2018-10-30 ENCOUNTER — Other Ambulatory Visit: Payer: Self-pay

## 2018-11-19 ENCOUNTER — Ambulatory Visit: Payer: Medicare Other | Admitting: Cardiology

## 2018-12-12 ENCOUNTER — Other Ambulatory Visit: Payer: Medicare Other

## 2018-12-19 ENCOUNTER — Ambulatory Visit: Payer: Medicare Other | Admitting: Cardiology

## 2019-01-11 ENCOUNTER — Other Ambulatory Visit: Payer: Self-pay | Admitting: Cardiology

## 2019-04-17 ENCOUNTER — Ambulatory Visit: Payer: Medicare Other

## 2019-04-17 ENCOUNTER — Other Ambulatory Visit: Payer: Self-pay

## 2019-04-17 DIAGNOSIS — I714 Abdominal aortic aneurysm, without rupture, unspecified: Secondary | ICD-10-CM

## 2019-04-17 DIAGNOSIS — I6521 Occlusion and stenosis of right carotid artery: Secondary | ICD-10-CM

## 2019-04-21 ENCOUNTER — Other Ambulatory Visit: Payer: Self-pay | Admitting: Cardiology

## 2019-04-21 DIAGNOSIS — I6521 Occlusion and stenosis of right carotid artery: Secondary | ICD-10-CM

## 2019-04-24 ENCOUNTER — Encounter: Payer: Self-pay | Admitting: Cardiology

## 2019-04-24 ENCOUNTER — Other Ambulatory Visit: Payer: Self-pay

## 2019-04-24 ENCOUNTER — Ambulatory Visit (INDEPENDENT_AMBULATORY_CARE_PROVIDER_SITE_OTHER): Payer: Medicare Other | Admitting: Cardiology

## 2019-04-24 VITALS — BP 144/71 | HR 96 | Temp 97.5°F | Ht 68.0 in | Wt 239.0 lb

## 2019-04-24 DIAGNOSIS — E782 Mixed hyperlipidemia: Secondary | ICD-10-CM | POA: Diagnosis not present

## 2019-04-24 DIAGNOSIS — I10 Essential (primary) hypertension: Secondary | ICD-10-CM | POA: Diagnosis not present

## 2019-04-24 DIAGNOSIS — I6521 Occlusion and stenosis of right carotid artery: Secondary | ICD-10-CM

## 2019-04-24 DIAGNOSIS — I714 Abdominal aortic aneurysm, without rupture, unspecified: Secondary | ICD-10-CM

## 2019-04-24 MED ORDER — LABETALOL HCL 200 MG PO TABS: 200.0000 mg | ORAL_TABLET | Freq: Two times a day (BID) | ORAL | 3 refills | Status: DC

## 2019-04-24 NOTE — Progress Notes (Signed)
Primary Physician/Referring:  Tamsen Roers, MD  Patient ID: Christopher Rasmussen, male    DOB: [birth year], 73 y.o.   MRN: [id number]  Chief Complaint  Patient presents with  . Hypertension  . Carotid    Results fo Doppler   HPI:    Christopher Rasmussen  is a 73 y.o. Caucasian male with  mild aortic stenosis, controlled diabetes mellitus, moderate obesity, hypertension and hyperlipidemia presents here for 6 month office visit. He is asymptomatic right carotid stenosis and a very small abdominal aortic aneurysm as well.  He presents here for Six-month visit, states that he is been doing well,  denies associated chest pain or shortness of breath.  No PND or orthopnea, no leg edema.  Past Medical History:  Diagnosis Date  . Abdominal aortic aneurysm (AAA) ([place])   . Abdominal aortic aneurysm (AAA) 30 to 34 mm in diameter ([place]) 10/04/2018  . Aortic stenosis   . Arthritis   . Asymptomatic stenosis of right carotid artery 10/04/2018  . Carotid stenosis   . Heart block   . High cholesterol    under control  . Hypertension   . Type II diabetes mellitus ([place])    Past Surgical History:  Procedure Laterality Date  . KNEE ARTHROSCOPY Left 1990's  . KNEE CLOSED REDUCTION Left 10/17/2014   Procedure: CLOSED MANIPULATION LEFT KNEE UNDER ANESTHESIA;  Surgeon: Mcarthur Rossetti, MD;  Location: [facility];  Service: Orthopedics;  Laterality: Left;  . KNEE CLOSED REDUCTION Left 12/19/2014   Procedure: CLOSED MANIPULATION  LEFT KNEE;  Surgeon: Mcarthur Rossetti, MD;  Location: [facility];  Service: Orthopedics;  Laterality: Left;  . 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   Procedure: LEFT TOTAL KNEE ARTHROPLASTY;  Surgeon: Mcarthur Rossetti, MD;  Location: [facility];  Service: Orthopedics;  Laterality: Left;  . TOTAL KNEE ARTHROPLASTY Right 12/19/2014   Procedure: RIGHT TOTAL KNEE ARTHROPLASTY;  Surgeon: Mcarthur Rossetti, MD;  Location: [facility];  Service: Orthopedics;  Laterality: Right;   Social  History   Socioeconomic History  . Marital status: Married    Spouse name: Not on file  . Number of children: 2  . Years of education: Not on file  . Highest education level: Not on file  Occupational History  . Not on file  Social Needs  . Financial resource strain: Not on file  . Food insecurity    Worry: Not on file    Inability: Not on file  . Transportation needs    Medical: Not on file    Non-medical: Not on file  Tobacco Use  . Smoking status: Never Smoker  . Smokeless tobacco: Never Used  Substance and Sexual Activity  . Alcohol use: No  . Drug use: No  . Sexual activity: Never  Lifestyle  . Physical activity    Days per week: Not on file    Minutes per session: Not on file  . Stress: Not on file  Relationships  . Social Herbalist on phone: Not on file    Gets together: Not on file    Attends religious service: Not on file    Active member of club or organization: Not on file    Attends meetings of clubs or organizations: Not on file    Relationship status: Not on file  . Intimate partner violence    Fear of current or ex partner: Not on file    Emotionally abused: Not on file    Physically abused:  Not on file    Forced sexual activity: Not on file  Other Topics Concern  . Not on file  Social History Narrative  . Not on file   ROS  Review of Systems  Constitution: Negative for chills, decreased appetite, malaise/fatigue and weight gain.  Cardiovascular: Negative for dyspnea on exertion, leg swelling and syncope.  Endocrine: Negative for cold intolerance.  Hematologic/Lymphatic: Does not bruise/bleed easily.  Musculoskeletal: Positive for arthritis. Negative for joint swelling.  Gastrointestinal: Negative for abdominal pain, anorexia, change in bowel habit, hematochezia and melena.  Neurological: Negative for headaches and light-headedness.  Psychiatric/Behavioral: Negative for depression and substance abuse.  All other systems reviewed and  are negative.  Objective   Vitals with BMI 04/24/2019 10/04/2018 12/22/2014  Height _0  _1  -  Weight 239 lbs 238 lbs -  BMI 97.58 83.25 -  Systolic 498 264 158  Diastolic 71 81 71  Pulse 96 86 96    Blood pressure (!) 144/71, pulse 96, temperature (!) 97.5 F (36.4 C), height _2  (1.727 m), weight 239 lb (108.4 kg), SpO2 97 %. Body mass index is 36.34 kg/m.   Physical Exam  Constitutional:  Well-built and moderately obese in no acute distress  HENT:  Head: Atraumatic.  Eyes: Conjunctivae are normal.  Neck: Neck supple. No JVD present. No thyromegaly present.  Cardiovascular: Normal rate, regular rhythm, S1 normal, S2 normal, intact distal pulses and normal pulses. Exam reveals no gallop.  Murmur heard.  Early systolic murmur is present with a grade of 2/6 at the upper right sternal border. Pulses:      Carotid pulses are on the right side with bruit and on the left side with bruit. No JVD, No leg edema  Pulmonary/Chest: Effort normal and breath sounds normal.  Abdominal: Soft. Bowel sounds are normal.  Musculoskeletal: Normal range of motion.        General: No edema.  Neurological: He is alert.  Skin: Skin is warm and dry.  Psychiatric: He has a normal mood and affect.   Radiology: No results found.  Laboratory examination:   Labs 04/01/2019: A1c 6.9%.  Total testosterone 238, mildly reduced.  HB 17.8/HCT 51.3, platelets 220.  Normal indicis.  Total cholesterol 115, triglycerides 120, HDL 32, LDL 62.  Non-HDL cholesterol 83.  Serum glucose 144, BUN 16, creatinine 0.91, eGFR 84 mL, potassium 4.2.  CMP otherwise normal.  He is a normal.  Vitamin D 23.  03/28/2018: Cholesterol 112, triglycerides 65, HDL 34, LDL 64.  Creatinine 0.9, EGFR 85/99, potassium 4.1, bilirubin 1.4, CMP otherwise normal.  Hemoglobin A1c 7.3%.  TSH normal.  CBC normal.  Iron panel normal.  Vitamin D normal.  No results for input(s): NA, K, CL, CO2, GLUCOSE, BUN, CREATININE, CALCIUM, GFRNONAA,  GFRAA in the last 8760 hours. CMP Latest Ref Rng & Units 12/22/2014 12/21/2014 12/20/2014  Glucose 65 - 99 mg/dL 133(H) 177(H) 184(H)  BUN 6 - 20 mg/dL 13 37(H) 22(H)  Creatinine 0.61 - 1.24 mg/dL 0.60(L) 1.27(H) 0.75  Sodium 135 - 145 mmol/L 138 133(L) 135  Potassium 3.5 - 5.1 mmol/L 3.8 4.5 4.2  Chloride 101 - 111 mmol/L 103 99(L) 103  CO2 22 - 32 mmol/L _3 Calcium 8.9 - 10.3 mg/dL 8.5(L) 8.8(L) 8.4(L)  Total Protein 6.0 - 8.3 g/dL - - -  Total Bilirubin 0.3 - 1.2 mg/dL - - -  Alkaline Phos 39 - 117 U/L - - -  AST 0 - 37 U/L - - -  ALT 0 - 53 U/L - - -   CBC Latest Ref Rng & Units 12/22/2014 12/21/2014 12/20/2014  WBC 4.0 - 10.5 K/uL 7.5 11.4(H) 11.3(H)  Hemoglobin 13.0 - 17.0 g/dL 11.5(L) 10.6(L) 12.5(L)  Hematocrit 39.0 - 52.0 % 32.9(L) 32.3(L) 35.9(L)  Platelets 150 - 400 K/uL 156 170 203   Lipid Panel     Component Value Date/Time   CHOL 111 02/23/2013 0605   TRIG 266 (H) 02/23/2013 0605   HDL 27 (L) 02/23/2013 0605   CHOLHDL 4.1 02/23/2013 0605   VLDL 53 (H) 02/23/2013 0605   LDLCALC 31 02/23/2013 0605   HEMOGLOBIN A1C Lab Results  Component Value Date   HGBA1C 10.5 (H) 08/24/2014   MPG 255 (H) 08/24/2014   TSH No results for input(s): TSH in the last 8760 hours. Medications   Prior to Admission medications   Medication Sig Start Date End Date Taking? Authorizing Provider  amLODipine (NORVASC) 10 MG tablet Take 10 mg by mouth every morning.    Yes [provider]  aspirin EC 325 MG EC tablet Take 1 tablet (325 mg total) by mouth 2 (two) times daily after a meal. Patient taking differently: Take 325 mg by mouth daily.  12/22/14  Yes Pete Pelt, PA-C  cloNIDine (CATAPRES - DOSED IN MG/24 HR) 0.2 mg/24hr patch Place 1 patch (0.2 mg total) onto the skin once a week. 10/04/18  Yes Adrian Prows, MD  HUMALOG KWIKPEN 100 UNIT/ML KwikPen 10 Units daily. 12/24/18  Yes [provider]  HUMALOG MIX 75/25 KWIKPEN (75-25) 100 UNIT/ML Kwikpen 30 Units  daily. 03/11/19  Yes [provider]  metFORMIN (GLUCOPHAGE) 1000 MG tablet Take 1,000 mg by mouth 2 (two) times daily with a meal.   Yes [provider]  metoprolol succinate (TOPROL-XL) 50 MG 24 hr tablet Take 50 mg by mouth at bedtime. Take with or immediately following a meal.   Yes [provider]  OZEMPIC, 1 MG/DOSE, 2 MG/1.5ML SOPN 1 Units once a week. 03/20/19  Yes [provider]  simvastatin (ZOCOR) 10 MG tablet Take 10 mg by mouth at bedtime.   Yes [provider]  valsartan-hydrochlorothiazide (DIOVAN-HCT) 320-12.5 MG tablet TAKE 1 TABLET BY MOUTH EVERY MORNING 01/14/19  Yes Adrian Prows, MD     Current Outpatient Medications  Medication Instructions  . amLODipine (NORVASC) 10 mg, Oral,  Every morning - 10a  . aspirin 325 mg, Oral, 2 times daily after meals  . cloNIDine (CATAPRES - DOSED IN MG/24 HR) 0.2 mg, Transdermal, Weekly  . HumaLOG KwikPen 10 Units, Daily  . HUMALOG MIX 75/25 KWIKPEN (75-25) 100 UNIT/ML Kwikpen 30 Units, Daily  . labetalol (NORMODYNE) 200 mg, Oral, 2 times daily  . metFORMIN (GLUCOPHAGE) 1,000 mg, Oral, 2 times daily with meals  . Ozempic (1 MG/DOSE) 1 Units, Weekly  . simvastatin (ZOCOR) 10 mg, Daily at bedtime  . valsartan-hydrochlorothiazide (DIOVAN-HCT) 320-12.5 MG tablet TAKE 1 TABLET BY MOUTH EVERY MORNING    Cardiac Studies:   Exercise Myoview stress test 03/04/2013: 1. Resting EKG showed normal sinus rhythm, poor R wave progression, pulmonary disease pattern.  Patient exercised on Bruce protocol for 4 minutes 31 sec. The maximum work level achieved was 7.3 MET's.  The test was terminated due to achievement of the target heart rate. 2. Perfusion imaging studies demonstrate mild diaphragmatic attenuation artifact in the inferior wall.  There was no evidence of ischemia or infarct. Dynamic gated images reveal normal wall motion and endocardial thickening. Left ventricular  ejection fraction was estimated to be 58%.  This is a low risk stress test.  Abdominal aortic duplex 01/19/2017: A distal abdominal aortic aneurysm measuring 2.92  x 3.06  x 3.08  cm is seen. Focal plaque noted in the distal aorta. F/u in 5 years recommended if clinically indicated.  Carotid artery duplex  04/17/2019: Stenosis in the right internal carotid artery (50-69%).   No significant stenosis in the left ICA.  Antegrade right vertebral artery flow. Antegrade left vertebral artery flow. Compared to the study done on 05/01/2018, no significant change. Follow up in six months is appropriate if clinically indicated.  Assessment     ICD-10-CM   1. Essential hypertension  I10 EKG 12-Lead    labetalol (NORMODYNE) 200 MG tablet  2. Mixed hyperlipidemia  E78.2   3. Asymptomatic stenosis of right carotid artery  I65.21   4. Abdominal aortic aneurysm (AAA) 30 to 34 mm in diameter ([facility])  I71.4   Discontinue Metoprolol succinate       EKG 04/24/2019: Normal sinus rhythm at the rate of 93 bpm, left axis deviation, left plantar fascicular block.  Incomplete right bundle branch block.  Low-voltage complexes. Compared to   EKG 10/04/2018: Sinus rhythm with first-degree AV block not present.   Recommendations:   Patient is here on a six-month office visit and follow-up of hypertension, asymptomatic carotid stenosis and abdominal aortic aneurysm surveillance.  He is presently asymptomatic, weight has been stable, diabetes is well controlled.  I reviewed his labs from the PCP, updated them.  Blood pressure continues to remain uncontrolled.  I have discontinued metoprolol succinate and switched him to labetalol 200 mg p.o. b.i.d.  I will like to see him back in 6 weeks for follow-up of hypertension.  I reviewed the results of the carotid artery duplex, we will continue the surveillance.  AAA is small, he will probably need a surveillance in couple years.  I have encouraged the patient to lose weight and to resume his exercise that he is to walk on  a regular basis.  Adrian Prows, MD, [facility] 04/24/2019, 10:21 AM [facility]. [place] Pager: [phone] Office: [phone] If no answer Cell [phone]

## 2019-06-03 ENCOUNTER — Encounter: Payer: Self-pay | Admitting: Cardiology

## 2019-06-05 ENCOUNTER — Ambulatory Visit: Payer: Medicare Other | Admitting: Cardiology

## 2019-06-05 ENCOUNTER — Other Ambulatory Visit: Payer: Self-pay

## 2019-06-05 ENCOUNTER — Encounter: Payer: Self-pay | Admitting: Cardiology

## 2019-06-05 VITALS — BP 126/59 | HR 83 | Temp 97.6°F | Ht 70.0 in | Wt 235.0 lb

## 2019-06-05 DIAGNOSIS — I714 Abdominal aortic aneurysm, without rupture, unspecified: Secondary | ICD-10-CM

## 2019-06-05 DIAGNOSIS — I10 Essential (primary) hypertension: Secondary | ICD-10-CM

## 2019-06-05 DIAGNOSIS — E668 Other obesity: Secondary | ICD-10-CM

## 2019-06-05 MED ORDER — LABETALOL HCL 200 MG PO TABS: 200.0000 mg | ORAL_TABLET | Freq: Two times a day (BID) | ORAL | 3 refills | Status: DC

## 2019-06-05 NOTE — Progress Notes (Signed)
Primary Physician/Referring:  Tamsen Roers, MD  Patient ID: Christopher Rasmussen, male    DOB: 1945/08/14, 73 y.o.   MRN: [id number]  Chief Complaint  Patient presents with  . Hypertension  . Follow-up    6 week   HPI:    [name]  is a 73 y.o. Caucasian male with mild aortic stenosis, controlled diabetes mellitus, moderate obesity, hypertension and hyperlipidemia presents here for 6 month office visit. He is asymptomatic right carotid stenosis and a very small abdominal aortic aneurysm as well.  Patient is here for 6-week office visit and follow-up for hypertension.  At his last office visit, metoprolol was changed to labetalol.  He is tolerating this well and blood pressure has improved. Denies associated chest pain or shortness of breath.  No PND or orthopnea, no leg edema.  Past Medical History:  Diagnosis Date  . Abdominal aortic aneurysm (AAA) ([place])   . Abdominal aortic aneurysm (AAA) 30 to 34 mm in diameter ([place]) 10/04/2018  . Aortic stenosis   . Arthritis   . Asymptomatic stenosis of right carotid artery 10/04/2018  . Carotid stenosis   . Heart block   . High cholesterol    under control  . Hypertension   . Type II diabetes mellitus ([place])    Past Surgical History:  Procedure Laterality Date  . CATARACT EXTRACTION    . KNEE ARTHROSCOPY Left 1990's  . KNEE CLOSED REDUCTION Left 10/17/2014   Procedure: CLOSED MANIPULATION LEFT KNEE UNDER ANESTHESIA;  Surgeon: Mcarthur Rossetti, MD;  Location: [facility];  Service: Orthopedics;  Laterality: Left;  . KNEE CLOSED REDUCTION Left 12/19/2014   Procedure: CLOSED MANIPULATION  LEFT KNEE;  Surgeon: Mcarthur Rossetti, MD;  Location: [facility];  Service: Orthopedics;  Laterality: Left;  . 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   Procedure: LEFT TOTAL KNEE ARTHROPLASTY;  Surgeon: Mcarthur Rossetti, MD;  Location: [facility];  Service: Orthopedics;  Laterality: Left;  . TOTAL KNEE ARTHROPLASTY Right 12/19/2014   Procedure: RIGHT  TOTAL KNEE ARTHROPLASTY;  Surgeon: Mcarthur Rossetti, MD;  Location: [facility];  Service: Orthopedics;  Laterality: Right;   Social History   Socioeconomic History  . Marital status: Married    Spouse name: Not on file  . Number of children: 2  . Years of education: Not on file  . Highest education level: Not on file  Occupational History  . Not on file  Social Needs  . Financial resource strain: Not on file  . Food insecurity    Worry: Not on file    Inability: Not on file  . Transportation needs    Medical: Not on file    Non-medical: Not on file  Tobacco Use  . Smoking status: Never Smoker  . Smokeless tobacco: Never Used  Substance and Sexual Activity  . Alcohol use: No  . Drug use: No  . Sexual activity: Never  Lifestyle  . Physical activity    Days per week: Not on file    Minutes per session: Not on file  . Stress: Not on file  Relationships  . Social Herbalist on phone: Not on file    Gets together: Not on file    Attends religious service: Not on file    Active member of club or organization: Not on file    Attends meetings of clubs or organizations: Not on file    Relationship status: Not on file  . Intimate partner violence  Fear of current or ex partner: Not on file    Emotionally abused: Not on file    Physically abused: Not on file    Forced sexual activity: Not on file  Other Topics Concern  . Not on file  Social History Narrative  . Not on file   ROS  Review of Systems  Constitution: Negative for chills, decreased appetite, malaise/fatigue and weight gain.  Cardiovascular: Negative for chest pain, dyspnea on exertion, leg swelling and syncope.  Endocrine: Negative for cold intolerance.  Hematologic/Lymphatic: Does not bruise/bleed easily.  Musculoskeletal: Positive for arthritis. Negative for joint swelling.  Gastrointestinal: Negative for abdominal pain, anorexia, change in bowel habit, hematochezia and melena.  Neurological:  Negative for headaches and light-headedness.  Psychiatric/Behavioral: Negative for depression and substance abuse.  All other systems reviewed and are negative.  Objective   Vitals with BMI 06/05/2019 04/24/2019 10/04/2018  Height 5' 10"  5' 8"  5' 7"   Weight 235 lbs 239 lbs 238 lbs  BMI 33.72 67.01 41.03  Systolic 013 143 888  Diastolic 59 71 81  Pulse 83 96 86    Blood pressure (!) 126/59, pulse 83, temperature 97.6 F (36.4 C), height 5' 10"  (1.778 m), weight 235 lb (106.6 kg), SpO2 93 %. Body mass index is 33.72 kg/m.   Physical Exam  Constitutional:  Well-built and moderately obese in no acute distress  HENT:  Head: Atraumatic.  Eyes: Conjunctivae are normal.  Neck: Neck supple. No JVD present. No thyromegaly present.  Cardiovascular: Normal rate, regular rhythm, S1 normal, S2 normal, intact distal pulses and normal pulses. Exam reveals no gallop.  Murmur heard.  Early systolic murmur is present with a grade of 2/6 at the upper right sternal border. Pulses:      Carotid pulses are on the right side with bruit and on the left side with bruit. No JVD, No leg edema  Pulmonary/Chest: Effort normal and breath sounds normal.  Abdominal: Soft. Bowel sounds are normal.  Musculoskeletal: Normal range of motion.        General: No edema.  Neurological: He is alert.  Skin: Skin is warm and dry.  Psychiatric: He has a normal mood and affect.   Radiology: No results found.  Laboratory examination:   Labs 04/01/2019: A1c 6.9%.  Total testosterone 238, mildly reduced.  HB 17.8/HCT 51.3, platelets 220.  Normal indicis.  Total cholesterol 115, triglycerides 120, HDL 32, LDL 62.  Non-HDL cholesterol 83.  Serum glucose 144, BUN 16, creatinine 0.91, eGFR 84 mL, potassium 4.2.  CMP otherwise normal.  He is a normal.  Vitamin D 23.  03/28/2018: Cholesterol 112, triglycerides 65, HDL 34, LDL 64.  Creatinine 0.9, EGFR 85/99, potassium 4.1, bilirubin 1.4, CMP otherwise normal.  Hemoglobin A1c  7.3%.  TSH normal.  CBC normal.  Iron panel normal.  Vitamin D normal.  No results for input(s): NA, K, CL, CO2, GLUCOSE, BUN, CREATININE, CALCIUM, GFRNONAA, GFRAA in the last 8760 hours. CMP Latest Ref Rng & Units 12/22/2014 12/21/2014 12/20/2014  Glucose 65 - 99 mg/dL 133(H) 177(H) 184(H)  BUN 6 - 20 mg/dL 13 37(H) 22(H)  Creatinine 0.61 - 1.24 mg/dL 0.60(L) 1.27(H) 0.75  Sodium 135 - 145 mmol/L 138 133(L) 135  Potassium 3.5 - 5.1 mmol/L 3.8 4.5 4.2  Chloride 101 - 111 mmol/L 103 99(L) 103  CO2 22 - 32 mmol/L 23 26 27   Calcium 8.9 - 10.3 mg/dL 8.5(L) 8.8(L) 8.4(L)  Total Protein 6.0 - 8.3 g/dL - - -  Total Bilirubin  0.3 - 1.2 mg/dL - - -  Alkaline Phos 39 - 117 U/L - - -  AST 0 - 37 U/L - - -  ALT 0 - 53 U/L - - -   CBC Latest Ref Rng & Units 12/22/2014 12/21/2014 12/20/2014  WBC 4.0 - 10.5 K/uL 7.5 11.4(H) 11.3(H)  Hemoglobin 13.0 - 17.0 g/dL 11.5(L) 10.6(L) 12.5(L)  Hematocrit 39.0 - 52.0 % 32.9(L) 32.3(L) 35.9(L)  Platelets 150 - 400 K/uL 156 170 203   Lipid Panel     Component Value Date/Time   CHOL 111 02/23/2013 0605   TRIG 266 (H) 02/23/2013 0605   HDL 27 (L) 02/23/2013 0605   CHOLHDL 4.1 02/23/2013 0605   VLDL 53 (H) 02/23/2013 0605   LDLCALC 31 02/23/2013 0605   HEMOGLOBIN A1C Lab Results  Component Value Date   HGBA1C 10.5 (H) 08/24/2014   MPG 255 (H) 08/24/2014   TSH No results for input(s): TSH in the last 8760 hours. Medications   Prior to Admission medications   Medication Sig Start Date End Date Taking? Authorizing Provider  amLODipine (NORVASC) 10 MG tablet Take 10 mg by mouth every morning.    Yes [provider]  aspirin EC 325 MG EC tablet Take 1 tablet (325 mg total) by mouth 2 (two) times daily after a meal. Patient taking differently: Take 325 mg by mouth daily.  12/22/14  Yes Pete Pelt, PA-C  cloNIDine (CATAPRES - DOSED IN MG/24 HR) 0.2 mg/24hr patch Place 1 patch (0.2 mg total) onto the skin once a week. 10/04/18  Yes Adrian Prows, MD   HUMALOG KWIKPEN 100 UNIT/ML KwikPen 10 Units daily. 12/24/18  Yes [provider]  HUMALOG MIX 75/25 KWIKPEN (75-25) 100 UNIT/ML Kwikpen 30 Units daily. 03/11/19  Yes [provider]  metFORMIN (GLUCOPHAGE) 1000 MG tablet Take 1,000 mg by mouth 2 (two) times daily with a meal.   Yes [provider]  metoprolol succinate (TOPROL-XL) 50 MG 24 hr tablet Take 50 mg by mouth at bedtime. Take with or immediately following a meal.   Yes [provider]  OZEMPIC, 1 MG/DOSE, 2 MG/1.5ML SOPN 1 Units once a week. 03/20/19  Yes [provider]  simvastatin (ZOCOR) 10 MG tablet Take 10 mg by mouth at bedtime.   Yes [provider]  valsartan-hydrochlorothiazide (DIOVAN-HCT) 320-12.5 MG tablet TAKE 1 TABLET BY MOUTH EVERY MORNING 01/14/19  Yes Adrian Prows, MD     Current Outpatient Medications  Medication Instructions  . amLODipine (NORVASC) 10 mg, Oral,  Every morning - 10a  . aspirin EC 81 mg, Oral, Daily  . cloNIDine (CATAPRES - DOSED IN MG/24 HR) 0.2 mg, Transdermal, Weekly  . HumaLOG KwikPen 10 Units, Daily  . HUMALOG MIX 75/25 KWIKPEN (75-25) 100 UNIT/ML Kwikpen 30 Units, Daily  . labetalol (NORMODYNE) 200 mg, Oral, 2 times daily  . metFORMIN (GLUCOPHAGE) 1,000 mg, Oral, 2 times daily with meals  . Ozempic (1 MG/DOSE) 1 Units, Weekly  . simvastatin (ZOCOR) 10 mg, Daily at bedtime  . valsartan-hydrochlorothiazide (DIOVAN-HCT) 320-12.5 MG tablet TAKE 1 TABLET BY MOUTH EVERY MORNING    Cardiac Studies:   Exercise Myoview stress test 03/04/2013: 1. Resting EKG showed normal sinus rhythm, poor R wave progression, pulmonary disease pattern.  Patient exercised on Bruce protocol for 4 minutes 31 sec. The maximum work level achieved was 7.3 MET's.  The test was terminated due to achievement of the target heart rate. 2. Perfusion imaging studies demonstrate mild diaphragmatic attenuation artifact in  the inferior wall.  There was no evidence of ischemia or  infarct. Dynamic gated images reveal normal wall motion and endocardial thickening. Left ventricular ejection fraction was estimated to be 58%. This is a low risk stress test.  Abdominal aortic duplex 01/19/2017: A distal abdominal aortic aneurysm measuring 2.92  x 3.06  x 3.08  cm is seen. Focal plaque noted in the distal aorta. F/u in 5 years recommended if clinically indicated.  Carotid artery duplex  04/17/2019: Stenosis in the right internal carotid artery (50-69%).   No significant stenosis in the left ICA.  Antegrade right vertebral artery flow. Antegrade left vertebral artery flow. Compared to the study done on 05/01/2018, no significant change. Follow up in six months is appropriate if clinically indicated.  Assessment     ICD-10-CM   1. Essential hypertension  I10   2. Abdominal aortic aneurysm (AAA) 30 to 34 mm in diameter (HCC)  I71.4   3. Moderate obesity  E66.8         EKG 04/24/2019: Normal sinus rhythm at the rate of 93 bpm, left axis deviation, left plantar fascicular block.  Incomplete right bundle branch block.  Low-voltage complexes. Compared to   EKG 10/04/2018: Sinus rhythm with first-degree AV block not present.   Recommendations:   Patient is currently a 6-week office visit and follow-up for hypertension.  Blood pressure has significantly improved since being on labetalol.  He is tolerating this without any complaints.  We will send in 90-day supply.  Discussed importance of good blood pressure control in view of aortic aneurysm that has been stable.  He will need continued surveillance of this.  I have also encouraged him to continue with efforts towards weight loss with diet modifications and regular exercise.  He has lost a few pounds since last seen by me 6 weeks ago.  He will need repeat carotid duplex in 6 months for follow-up, will plan to see him back after this for reevaluation of hypertension and carotid duplex results.  Miquel Dunn, MSN, APRN,  FNP-C [facility]. [place] Office: [phone] Fax: [phone]

## 2019-07-12 ENCOUNTER — Other Ambulatory Visit: Payer: Self-pay | Admitting: Cardiology

## 2019-10-01 ENCOUNTER — Other Ambulatory Visit: Payer: Self-pay | Admitting: Cardiology

## 2019-10-01 DIAGNOSIS — I10 Essential (primary) hypertension: Secondary | ICD-10-CM

## 2019-11-25 ENCOUNTER — Other Ambulatory Visit: Payer: Medicare Other

## 2019-11-28 ENCOUNTER — Ambulatory Visit: Payer: Medicare Other

## 2019-11-28 ENCOUNTER — Other Ambulatory Visit: Payer: Self-pay

## 2019-11-28 DIAGNOSIS — I6521 Occlusion and stenosis of right carotid artery: Secondary | ICD-10-CM

## 2019-12-02 NOTE — Progress Notes (Signed)
Will discuss on the visit coming up soon.

## 2019-12-04 ENCOUNTER — Ambulatory Visit: Payer: Medicare Other | Admitting: Cardiology

## 2019-12-04 NOTE — Progress Notes (Signed)
Primary Physician/Referring:  Tamsen Roers, MD  Patient ID: Christopher Rasmussen, male    DOB: 1946-04-11, 74 y.o.   MRN: [id number]  Chief Complaint  Patient presents with  . Hypertension    carotid duplex results   HPI:    Christopher Rasmussen  is a 74 y.o. Caucasian male with mild aortic stenosis, controlled diabetes mellitus, moderate obesity, hypertension and hyperlipidemia presents here for 6 month office visit. He is asymptomatic right carotid stenosis.  Previously reported small abdominal aortic aneurysm from 2020 not present in recent study in 2021.  Is here on a 73-monthoffice visit for follow-up of hypertension, hyperlipidemia and carotid stenosis. Denies associated chest pain or shortness of breath.  No PND or orthopnea, no leg edema. He has been loosing weight by diet changes.   Past Medical History:  Diagnosis Date  . Abdominal aortic aneurysm (AAA) ([place]   . Abdominal aortic aneurysm (AAA) 30 to 34 mm in diameter ([place] 10/04/2018  . Aortic stenosis   . Arthritis   . Asymptomatic stenosis of right carotid artery 10/04/2018  . Carotid stenosis   . Heart block   . High cholesterol    under control  . Hypertension   . Type II diabetes mellitus ([facility]    Past Surgical History:  Procedure Laterality Date  . CATARACT EXTRACTION    . KNEE ARTHROSCOPY Left 1990's  . KNEE CLOSED REDUCTION Left 10/17/2014   Procedure: CLOSED MANIPULATION LEFT KNEE UNDER ANESTHESIA;  Surgeon: CMcarthur Rossetti MD;  Location: [facility];  Service: Orthopedics;  Laterality: Left;  . KNEE CLOSED REDUCTION Left 12/19/2014   Procedure: CLOSED MANIPULATION  LEFT KNEE;  Surgeon: CMcarthur Rossetti MD;  Location: [facility];  Service: Orthopedics;  Laterality: Left;  . 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   Procedure: LEFT TOTAL KNEE ARTHROPLASTY;  Surgeon: CMcarthur Rossetti MD;  Location: [facility];  Service: Orthopedics;  Laterality: Left;  . TOTAL KNEE ARTHROPLASTY Right 12/19/2014   Procedure: RIGHT  TOTAL KNEE ARTHROPLASTY;  Surgeon: CMcarthur Rossetti MD;  [facility];  Service: Orthopedics;  Laterality: Right;   Family History  Problem Relation Age of Onset  . CAD Father   . Heart attack Father   . Stroke Mother   . Anuerysm Mother     Social History   Tobacco Use  . Smoking status: Never Smoker  . Smokeless tobacco: Never Used  Substance Use Topics  . Alcohol use: No   Marital Status: Married  ROS  Review of Systems  Cardiovascular: Negative for chest pain, dyspnea on exertion and leg swelling.  Musculoskeletal: Positive for joint pain.  Gastrointestinal: Negative for melena.   Objective  Blood pressure 132/70, pulse 84, temperature 97.9 F (36.6 C), height 5' 10" (1.778 m), weight 223 lb 9.6 oz (101.4 kg), SpO2 97 %.  Vitals with BMI 12/05/2019 06/05/2019 04/24/2019  Height 5' 10" 5' 10" 5' 8"  Weight 223 lbs 10 oz 235 lbs 239 lbs  BMI 32.08 [phone] Systolic [id number] Diastolic 70 59 71  Pulse 84 83 96     Physical Exam  Constitutional: He appears well-developed and well-nourished.  moderately obese  HENT:  Head: Atraumatic.  Neck: No JVD present.  Cardiovascular: Normal rate, regular rhythm, S1 normal, S2 normal and intact distal pulses. Exam reveals no gallop.  Murmur heard.  Early systolic murmur is present with a grade of 2/6 at the upper right sternal border. No JVD, No leg edema  Pulses:      Carotid pulses are on the right side with bruit and on the left side with bruit. No JVD. No leg edema.  Pulmonary/Chest: Effort normal and breath sounds normal. No accessory muscle usage. No respiratory distress.  Abdominal: Soft. Bowel sounds are normal.  Musculoskeletal:     Cervical back: Neck supple.   Laboratory examination:   HEMOGLOBIN A1C Lab Results  Component Value Date   HGBA1C 10.5 (H) 08/24/2014   MPG 255 (H) 08/24/2014   External labs:   10/02/2019: A1c 5.9%.  Medications and allergies  No Known Allergies   Current  Outpatient Medications  Medication Instructions  . amLODipine (NORVASC) 10 mg, Oral,  Every morning - 10a  . aspirin EC 81 mg, Oral, Daily  . cloNIDine (CATAPRES - DOSED IN MG/24 HR) 0.2 mg, Transdermal, Weekly  . HumaLOG KwikPen 10 Units, Daily  . HUMALOG MIX 75/25 KWIKPEN (75-25) 100 UNIT/ML Kwikpen 30 Units, Daily  . labetalol (NORMODYNE) 200 mg, Oral, 2 times daily  . metFORMIN (GLUCOPHAGE) 1,000 mg, Oral, 2 times daily with meals  . NOVOLOG MIX 70/30 FLEXPEN (70-30) 100 UNIT/ML FlexPen No dose, route, or frequency recorded.  . Ozempic (1 MG/DOSE) 1 Units, Weekly  . simvastatin (ZOCOR) 10 mg, Daily at bedtime  . valsartan-hydrochlorothiazide (DIOVAN-HCT) 320-12.5 MG tablet TAKE 1 TABLET BY MOUTH EVERY MORNING  . Vitamin D (Ergocalciferol) (DRISDOL) 50,000 Units, Oral, Weekly   Radiology:   No results found.  Cardiac Studies:   Exercise Myoview stress test 03/04/2013: 1. Resting EKG showed normal sinus rhythm, poor R wave progression, pulmonary disease pattern.  Patient exercised on Bruce protocol for 4 minutes 31 sec. The maximum work level achieved was 7.3 MET's.  The test was terminated due to achievement of the target heart rate. 2. Perfusion imaging studies demonstrate mild diaphragmatic attenuation artifact in the inferior wall.  There was no evidence of ischemia or infarct. Dynamic gated images reveal normal wall motion and endocardial thickening. Left ventricular ejection fraction was estimated to be 58%. This is a low risk stress test.  Abdominal Aortic Duplex 04/17/2019:  The maximum aorta (sac) diameter is 2.23 cm (prox). Mixed atherosclerotic plaque. Normal flow velocities noted in the aorta. Iliac aretires could not be visualized due to body habitus.  No AAA noted.   Carotid artery duplex 11/28/2019:  Stenosis in the right internal carotid artery (50-69%).  Peak systolic velocities in the left bifurcation, internal, external and common carotid arteries are within  normal limits.  Antegrade right vertebral artery flow. Antegrade left vertebral artery flow.  Follow up in six months is appropriate if clinically indicated. NO significant change from 04/17/2019.  EKG    EKG 12/05/2019: Sinus rhythm with first-degree AV block at rate of 79 bpm, left axis deviation, left anterior fascicular block.  Incomplete right bundle branch block.  Nonspecific T abnormality.  Compared to 04/24/2019, first-degree AV block new.     Assessment     ICD-10-CM   1. Asymptomatic stenosis of right carotid artery  I65.21   2. Mild aortic stenosis  I35.0 PCV ECHOCARDIOGRAM COMPLETE  3. Essential hypertension  I10 EKG 12-Lead    PCV ECHOCARDIOGRAM COMPLETE  4. Mixed hyperlipidemia  E78.2     No orders of the defined types were placed in this encounter.   There are no discontinued medications.  Recommendations:   Christopher Rasmussen  is a 74 y.o. Caucasian male with mild aortic stenosis, controlled diabetes mellitus, moderate obesity, hypertension and hyperlipidemia presents here  for 6 month office visit. He is asymptomatic right carotid stenosis.  Previously reported small abdominal aortic aneurysm from 2020 not present in recent study in 2021.  This is a 90-monthoffice visit, he remains asymptomatic.  I am very proud of him that he has done a great job in making lifestyle changes and he has been losing weight.  Also his diabetes is improved subsequently now and is at goal.  I do not have his recent lipid profile testing but expect triglycerides to have improved as well.  This is being managed by his PCP. I reviewed his A1c.  With regard to aortic stenosis, although physical examination is consistent with mild AS, he has not had recent echocardiogram, will obtain an echocardiogram.  Blood pressure is well controlled, I would like to have blood pressure goal <130, as he is exercising and also trying to lose more weight, expect this to improve over time.  No change in carotid artery  stenosis.  We will continue to 6 monthly surveillance.  No obvious abdominal aortic aneurysm was noted during recent abdominal duplex, indeed even if he has a small aneurysm, we could consider repeating testing for 5 years.  Otherwise stable from cardiac standpoint, I will see him back in 1 year for follow-up.  JAdrian Prows MD, FThe [facility] LLP4/29/2021, 1:24 PM PAuroraCardiovascular. PForest JunctionOffice: [phone]

## 2019-12-05 ENCOUNTER — Ambulatory Visit: Payer: Medicare Other | Admitting: Cardiology

## 2019-12-05 ENCOUNTER — Other Ambulatory Visit: Payer: Self-pay

## 2019-12-05 ENCOUNTER — Encounter: Payer: Self-pay | Admitting: Cardiology

## 2019-12-05 VITALS — BP 132/70 | HR 84 | Temp 97.9°F | Ht 70.0 in | Wt 223.6 lb

## 2019-12-05 DIAGNOSIS — I6521 Occlusion and stenosis of right carotid artery: Secondary | ICD-10-CM

## 2019-12-05 DIAGNOSIS — I10 Essential (primary) hypertension: Secondary | ICD-10-CM

## 2019-12-05 DIAGNOSIS — E782 Mixed hyperlipidemia: Secondary | ICD-10-CM

## 2019-12-05 DIAGNOSIS — I350 Nonrheumatic aortic (valve) stenosis: Secondary | ICD-10-CM

## 2019-12-12 ENCOUNTER — Other Ambulatory Visit: Payer: Medicare PPO

## 2019-12-17 ENCOUNTER — Other Ambulatory Visit: Payer: Self-pay

## 2019-12-17 ENCOUNTER — Ambulatory Visit: Payer: Medicare Other

## 2019-12-17 DIAGNOSIS — I350 Nonrheumatic aortic (valve) stenosis: Secondary | ICD-10-CM

## 2019-12-17 DIAGNOSIS — I10 Essential (primary) hypertension: Secondary | ICD-10-CM

## 2019-12-18 NOTE — Progress Notes (Signed)
Normal LVEF. Mild AS

## 2020-01-08 ENCOUNTER — Other Ambulatory Visit: Payer: Self-pay | Admitting: Cardiology

## 2020-06-05 ENCOUNTER — Ambulatory Visit: Payer: Medicare PPO

## 2020-06-05 ENCOUNTER — Other Ambulatory Visit: Payer: Self-pay

## 2020-06-05 DIAGNOSIS — I6521 Occlusion and stenosis of right carotid artery: Secondary | ICD-10-CM

## 2020-06-08 ENCOUNTER — Other Ambulatory Visit: Payer: Self-pay | Admitting: Cardiology

## 2020-06-08 DIAGNOSIS — I6521 Occlusion and stenosis of right carotid artery: Secondary | ICD-10-CM

## 2020-06-11 NOTE — Progress Notes (Signed)
Called and spoke with patient regarding his carotid duplex, patient transferred to front desk to go ahead and schedule that appt. For 6 months.

## 2020-06-12 ENCOUNTER — Other Ambulatory Visit: Payer: Self-pay | Admitting: Cardiology

## 2020-06-12 DIAGNOSIS — I6521 Occlusion and stenosis of right carotid artery: Secondary | ICD-10-CM

## 2020-07-06 ENCOUNTER — Other Ambulatory Visit: Payer: Self-pay | Admitting: Cardiology

## 2020-08-14 ENCOUNTER — Other Ambulatory Visit: Payer: Self-pay

## 2020-08-14 DIAGNOSIS — I10 Essential (primary) hypertension: Secondary | ICD-10-CM

## 2020-08-14 MED ORDER — LABETALOL HCL 200 MG PO TABS: 200.0000 mg | ORAL_TABLET | Freq: Two times a day (BID) | ORAL | 1 refills | Status: DC

## 2020-09-17 ENCOUNTER — Other Ambulatory Visit: Payer: Self-pay | Admitting: Cardiology

## 2020-09-17 DIAGNOSIS — I10 Essential (primary) hypertension: Secondary | ICD-10-CM

## 2020-11-13 ENCOUNTER — Other Ambulatory Visit: Payer: Self-pay

## 2020-11-13 ENCOUNTER — Ambulatory Visit: Payer: Medicare PPO

## 2020-11-13 DIAGNOSIS — I6521 Occlusion and stenosis of right carotid artery: Secondary | ICD-10-CM

## 2020-11-16 ENCOUNTER — Other Ambulatory Visit: Payer: Medicare PPO

## 2020-12-04 NOTE — Progress Notes (Signed)
Called and spoke with patient regarding his CAD results.

## 2020-12-07 ENCOUNTER — Other Ambulatory Visit: Payer: Self-pay

## 2020-12-07 ENCOUNTER — Ambulatory Visit: Payer: Medicare PPO | Admitting: Cardiology

## 2020-12-07 ENCOUNTER — Encounter: Payer: Self-pay | Admitting: Cardiology

## 2020-12-07 VITALS — BP 129/64 | HR 85 | Temp 97.8°F | Resp 16 | Ht 70.0 in | Wt 223.0 lb

## 2020-12-07 DIAGNOSIS — I350 Nonrheumatic aortic (valve) stenosis: Secondary | ICD-10-CM

## 2020-12-07 DIAGNOSIS — I10 Essential (primary) hypertension: Secondary | ICD-10-CM

## 2020-12-07 DIAGNOSIS — I6521 Occlusion and stenosis of right carotid artery: Secondary | ICD-10-CM

## 2020-12-07 NOTE — Progress Notes (Signed)
Primary Physician/Referring:  Tamsen Roers, MD  Patient ID: Christopher Rasmussen, male    DOB: November 15, 1945, 75 y.o.   MRN: [id number]  Chief Complaint  Patient presents with  . Carotid Stenosis  . Hypertension  . Aortic Stenosis  . Follow-up    1 year   HPI:    Christopher Rasmussen  is a 75 y.o. Caucasian male with mild aortic stenosis, controlled diabetes mellitus, moderate obesity, hypertension and hyperlipidemia presents here for 6 month office visit. He is asymptomatic right carotid stenosis.  Previously reported small abdominal aortic aneurysm from 2020 not present in recent study in 2021.  Is here on a 58-monthoffice visit for follow-up of hypertension, hyperlipidemia and carotid stenosis. Denies associated chest pain or shortness of breath.  No PND or orthopnea, no leg edema. He has been loosing weight by diet changes.  Recently his NovoLog was switched over to Ozempic and he has lost about 12 to 13 pounds in weight since then.  Past Medical History:  Diagnosis Date  . Abdominal aortic aneurysm (AAA) ([place]   . Abdominal aortic aneurysm (AAA) 30 to 34 mm in diameter ([place] 10/04/2018  . Aortic stenosis   . Arthritis   . Asymptomatic stenosis of right carotid artery 10/04/2018  . Carotid stenosis   . Heart block   . High cholesterol    under control  . Hypertension   . Type II diabetes mellitus ([place]    Past Surgical History:  Procedure Laterality Date  . CATARACT EXTRACTION    . KNEE ARTHROSCOPY Left 1990's  . KNEE CLOSED REDUCTION Left 10/17/2014   Procedure: CLOSED MANIPULATION LEFT KNEE UNDER ANESTHESIA;  Surgeon: CMcarthur Rossetti MD;  [facility];  Service: Orthopedics;  Laterality: Left;  . KNEE CLOSED REDUCTION Left 12/19/2014   Procedure: CLOSED MANIPULATION  LEFT KNEE;  Surgeon: CMcarthur Rossetti MD;  [facility];  Service: Orthopedics;  Laterality: Left;  . 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   Procedure: LEFT TOTAL KNEE ARTHROPLASTY;  Surgeon:  CMcarthur Rossetti MD;  [facility];  Service: Orthopedics;  Laterality: Left;  . TOTAL KNEE ARTHROPLASTY Right 12/19/2014   Procedure: RIGHT TOTAL KNEE ARTHROPLASTY;  Surgeon: CMcarthur Rossetti MD;  [facility];  Service: Orthopedics;  Laterality: Right;   Family History  Problem Relation Age of Onset  . CAD Father   . Heart attack Father   . Stroke Mother   . Anuerysm Mother     Social History   Tobacco Use  . Smoking status: Never Smoker  . Smokeless tobacco: Never Used  Substance Use Topics  . Alcohol use: No   Marital Status: Married  ROS  Review of Systems  Cardiovascular: Negative for chest pain, dyspnea on exertion and leg swelling.  Musculoskeletal: Positive for joint pain.  Gastrointestinal: Negative for melena.   Objective  Blood pressure 129/64, pulse 85, temperature 97.8 F (36.6 C), temperature source Temporal, resp. rate 16, height 5' 10" (1.778 m), weight 223 lb (101.2 kg), SpO2 95 %.  Vitals with BMI 12/07/2020 12/05/2019 06/05/2019  Height 5' 10" 5' 10" 5' 10"  Weight 223 lbs 223 lbs 10 oz 235 lbs  BMI 32 [phone] Systolic [id number] Diastolic 64 70 59  Pulse 85 84 83     Physical Exam Constitutional:      Appearance: He is well-developed.     Comments: moderately obese  HENT:     Head: Atraumatic.  Neck:  Vascular: No JVD.  Cardiovascular:     Rate and Rhythm: Normal rate and regular rhythm.     Pulses: Intact distal pulses.          Carotid pulses are on the right side with bruit and on the left side with bruit.    Heart sounds: S1 normal and S2 normal. Murmur heard.   Early systolic murmur is present with a grade of 2/6 at the upper right sternal border. No JVD, No leg edema   No gallop.      Comments: No JVD. No leg edema. Pulmonary:     Effort: Pulmonary effort is normal. No accessory muscle usage or respiratory distress.     Breath sounds: Normal breath sounds.  Abdominal:     General: Bowel sounds are normal.      Palpations: Abdomen is soft.     Hernia: A hernia is present. Hernia is present in the ventral area (Small and reducible).  Musculoskeletal:     Cervical back: Neck supple.    Laboratory examination:    External labs:    Labs 04/01/2020:  S. glucose 218 mg, A1c 7.6%.  Total cholesterol 132, triglycerides 126, HDL 36, LDL 75.  BUN 14, creatinine 0.79, EGFR 90 mL, potassium 4.0.  Vitamin D 68.  Hb 15.9/HCT 46.3, platelets 191, normal indicis.  10/02/2019: A1c 5.9%.  Medications and allergies  No Known Allergies   Outpatient Medications Prior to Visit  Medication Sig Dispense Refill  . amLODipine (NORVASC) 10 MG tablet Take 10 mg by mouth every morning.     . aspirin EC 81 MG tablet Take 81 mg by mouth daily.    . cloNIDine (CATAPRES - DOSED IN MG/24 HR) 0.2 mg/24hr patch PLACE 1 PATCH ONTO THE SKIN ONCE A WEEK. 4 patch 12  . labetalol (NORMODYNE) 200 MG tablet Take 1 tablet (200 mg total) by mouth 2 (two) times daily. 180 tablet 1  . metFORMIN (GLUCOPHAGE) 1000 MG tablet Take 1,000 mg by mouth 2 (two) times daily with a meal.    . NOVOLOG MIX 70/30 FLEXPEN (70-30) 100 UNIT/ML FlexPen     . OZEMPIC, 1 MG/DOSE, 2 MG/1.5ML SOPN 1 Units once a week.    . simvastatin (ZOCOR) 10 MG tablet Take 10 mg by mouth at bedtime.    . valsartan-hydrochlorothiazide (DIOVAN-HCT) 320-12.5 MG tablet TAKE 1 TABLET BY MOUTH EVERY MORNING 90 tablet 2  . Vitamin D, Ergocalciferol, (DRISDOL) 1.25 MG (50000 UNIT) CAPS capsule Take 50,000 Units by mouth once a week.    . HUMALOG KWIKPEN 100 UNIT/ML KwikPen 10 Units daily.    . HUMALOG MIX 75/25 KWIKPEN (75-25) 100 UNIT/ML Kwikpen 30 Units daily.     No facility-administered medications prior to visit.    Radiology:   No results found.  Cardiac Studies:   Exercise Myoview stress test 03/04/2013: 1. Resting EKG showed normal sinus rhythm, poor R wave progression, pulmonary disease pattern.  Patient exercised on Bruce protocol for 4 minutes  31 sec. The maximum work level achieved was 7.3 MET's.  The test was terminated due to achievement of the target heart rate. 2. Perfusion imaging studies demonstrate mild diaphragmatic attenuation artifact in the inferior wall.  There was no evidence of ischemia or infarct. Dynamic gated images reveal normal wall motion and endocardial thickening. Left ventricular ejection fraction was estimated to be 58%. This is a low risk stress test.  Abdominal Aortic Duplex 04/17/2019:  The maximum aorta (sac) diameter is 2.23 cm (prox). Mixed   atherosclerotic plaque. Normal flow velocities noted in the aorta. Iliac aretires could not be visualized due to body habitus.  No AAA noted.   Echocardiogram 12/17/2019:   Normal LV systolic function with EF 55%. Left ventricle cavity is normal in size. Moderate concentric hypertrophy of the left ventricle. Normal global wall motion. Normal diastolic filling pattern. Calculated EF 55%. Trileaflet aortic valve with no regurgitation. Mild aortic valve leaflet calcification. Mildly restricted aortic valve leaflets. Mild aortic valve stenosis. Aortic valve peak pressure gradient of 26 and mean gradient of  16.5 mmHg, calculated aortic valve area 1.3 cm. IVC is dilated with respiratory variation. This may suggest elevated right heart pressure  Carotid artery duplex 11/13/2020: Duplex suggests stenosis in the right internal carotid artery (50-69%).  Stenosis in the left internal carotid artery (1-15%). Antegrade right vertebral artery flow. Antegrade left vertebral artery flow. No significant change from 06/05/2020. Follow up in six months is appropriate if clinically indicated.  EKG     EKG 12/07/2020: Sinus rhythm with borderline first-degree AV block at rate of 83 bpm, left atrial enlargement, left axis deviation, left anterior fascicular block.  Poor R wave progression, probably normal variant but cannot exclude anteroseptal infarct old.  No evidence of ischemia,  normal QT interval.  No significant change from EKG 12/05/2019.  Assessment     ICD-10-CM   1. Asymptomatic stenosis of right carotid artery  I65.21 EKG 12-Lead    PCV CAROTID DUPLEX (BILATERAL)  2. Essential hypertension  I10   3. Mild aortic stenosis  I35.0     No orders of the defined types were placed in this encounter.   Medications Discontinued During This Encounter  Medication Reason  . HUMALOG MIX 75/25 KWIKPEN (75-25) 100 UNIT/ML Kwikpen Error  . HUMALOG KWIKPEN 100 UNIT/ML KwikPen Error    Recommendations:   GAUTAM LANGHORST  is a 75 y.o. Caucasian male with mild aortic stenosis, controlled diabetes mellitus, moderate obesity, hypertension and hyperlipidemia presents here for 6 month office visit. He is asymptomatic right carotid stenosis.  Previously reported small abdominal aortic aneurysm from 2020 not present in recent study in 2021.  Patient presents here for annual visit, I reviewed his recently performed carotid artery duplex which is remained stable with moderate disease in his right carotid.  I also reviewed his external labs, his diabetes is worse compared to last year however recently he has started on Ozempic and since then has lost 13 pounds in weight and expect his diabetes to improve as well.  Blood pressure is well controlled.  Lipids are at goal and he is on appropriate medical therapy as well.  His aortic stenosis by auscultation appears to be stable.  No changes in the medications were done today, we will repeat carotid artery duplex in 6 months but I will continue to see him back on annual basis.  Regular exercise on a daily basis was discussed with the patient.   Adrian Prows, MD, [facility] 12/07/2020, 1:42 PM Office: [phone] Pager: [phone]

## 2021-02-11 ENCOUNTER — Other Ambulatory Visit: Payer: Self-pay | Admitting: Cardiology

## 2021-02-11 DIAGNOSIS — I10 Essential (primary) hypertension: Secondary | ICD-10-CM

## 2021-03-16 ENCOUNTER — Telehealth: Payer: Self-pay | Admitting: Cardiology

## 2021-03-16 NOTE — Telephone Encounter (Signed)
Christina from [facility] called to see if Pt's medication dosage (cholesterol med) needs to be increased. Says she sent a fax over to [place] this morning in regards to this inquiry.

## 2021-03-16 NOTE — Telephone Encounter (Signed)
Phone number for Christopher Rasmussen is [phone].

## 2021-03-17 NOTE — Telephone Encounter (Signed)
Attempted to call number, no answer. Pts Simvastatin dose is still currently 10mg . No change.

## 2021-03-30 ENCOUNTER — Other Ambulatory Visit: Payer: Self-pay | Admitting: Cardiology

## 2021-04-15 ENCOUNTER — Encounter (HOSPITAL_COMMUNITY): Payer: Self-pay

## 2021-04-15 ENCOUNTER — Emergency Department (HOSPITAL_COMMUNITY): Payer: Medicare PPO

## 2021-04-15 ENCOUNTER — Other Ambulatory Visit: Payer: Self-pay

## 2021-04-15 ENCOUNTER — Emergency Department (HOSPITAL_COMMUNITY)
Admission: EM | Admit: 2021-04-15 | Discharge: 2021-04-15 | Disposition: A | Discharge status: Home / Self Care | Payer: Medicare PPO | Attending: Student | Admitting: Student

## 2021-04-15 DIAGNOSIS — S86111A Strain of other muscle(s) and tendon(s) of posterior muscle group at lower leg level, right leg, initial encounter: Secondary | ICD-10-CM | POA: Diagnosis not present

## 2021-04-15 DIAGNOSIS — Z7984 Long term (current) use of oral hypoglycemic drugs: Secondary | ICD-10-CM | POA: Insufficient documentation

## 2021-04-15 DIAGNOSIS — M79661 Pain in right lower leg: Secondary | ICD-10-CM

## 2021-04-15 DIAGNOSIS — I10 Essential (primary) hypertension: Secondary | ICD-10-CM | POA: Diagnosis not present

## 2021-04-15 DIAGNOSIS — X58XXXA Exposure to other specified factors, initial encounter: Secondary | ICD-10-CM | POA: Diagnosis not present

## 2021-04-15 DIAGNOSIS — Z79899 Other long term (current) drug therapy: Secondary | ICD-10-CM | POA: Diagnosis not present

## 2021-04-15 DIAGNOSIS — S8991XA Unspecified injury of right lower leg, initial encounter: Secondary | ICD-10-CM | POA: Diagnosis present

## 2021-04-15 DIAGNOSIS — Z7982 Long term (current) use of aspirin: Secondary | ICD-10-CM | POA: Insufficient documentation

## 2021-04-15 DIAGNOSIS — E119 Type 2 diabetes mellitus without complications: Secondary | ICD-10-CM | POA: Insufficient documentation

## 2021-04-15 DIAGNOSIS — Z96653 Presence of artificial knee joint, bilateral: Secondary | ICD-10-CM | POA: Insufficient documentation

## 2021-04-15 DIAGNOSIS — S86811A Strain of other muscle(s) and tendon(s) at lower leg level, right leg, initial encounter: Secondary | ICD-10-CM

## 2021-04-15 NOTE — ED Notes (Addendum)
Pt ambulatory in ED lobby. 

## 2021-04-15 NOTE — ED Provider Notes (Signed)
Emergency Medicine Provider Triage Evaluation Note  Christopher Rasmussen , a 75 y.o. male  was evaluated in triage.  Pt complains of right calf pain.  Symptoms have been going on for 2 days without specific trigger.  Evaluated by PCP who prescribed him hydrocodone and Flexeril which has improved his pain from a 9/10 to a 1/10.  However because pain was persistent his PCP told him to come to the ER to rule out a DVT.  Review of Systems  Positive: Right calf pain Negative: Leg swelling, chest pain, shortness of breath  Physical Exam  BP 122/70 (BP Location: Left Arm)   Pulse 84   Temp 98.1 F (36.7 C) (Oral)   Resp 16   Ht 5\' 10"  (1.778 m)   Wt 101 kg   SpO2 95%   BMI 31.95 kg/m  Gen:   Awake, no distress   Resp:  Normal effort  MSK:   Moves extremities without difficulty  Other:  2+ DP pulse noted bilaterally.  No edema or erythema of lower extremities  Medical Decision Making  Medically screening exam initiated at 9:39 AM.  Appropriate orders placed.  was informed that the remainder of the evaluation will be completed by another provider, this initial triage assessment does not replace that evaluation, and the importance of remaining in the ED until their evaluation is complete.  Will order DVT study   [name], PA-C 04/15/21 0940    06/15/21, MD 04/15/21 [phone]

## 2021-04-15 NOTE — ED Triage Notes (Signed)
Cramping to right leg 2 days ago and went to PCP and prescribed hydrocodone and flexeril. Pt reports improvement with medication. PCP referred him for R/O blood clot

## 2021-04-15 NOTE — Discharge Instructions (Addendum)
Continue your home medications as previously prescribed. Follow up with your primary care provider. Your ultrasound today was negative for a DVT in the right lower extremity. Return to the ER if you start to experience worsening pain, if you develop swelling, redness, fever, injury, numbness.

## 2021-04-15 NOTE — ED Provider Notes (Signed)
[facility]-EMERGENCY DEPT Provider Note   CSN: [id number] Arrival date & time: 04/15/21  0909     History Chief Complaint  Patient presents with   Leg Pain    Christopher Rasmussen is a 75 y.o. male with a past medical history of diabetes, hypertension, AAA presenting to the ED with a chief complaint of right calf pain.  Symptoms have been going on for 2 days.  No specific trigger, injury that he is aware of.  No history of similar symptoms in the past.  He was evaluated by his PCP, given Flexeril and hydrocodone.  He has been taking these medication and it has significantly improved the pain from a 9/10 to 1/10.  He told his PCP that the pain was still present however any told him to come to the ER for an ultrasound to rule out DVT.  No history of DVT or PE.  No chest pain, shortness of breath, hemoptysis, recent immobilization. No fevers.       Past Medical History:  Diagnosis Date   Abdominal aortic aneurysm (AAA) (HCC)    Abdominal aortic aneurysm (AAA) 30 to 34 mm in diameter (HCC) 10/04/2018   Aortic stenosis    Arthritis    Asymptomatic stenosis of right carotid artery 10/04/2018   Carotid stenosis    Heart block    High cholesterol    under control   Hypertension    Type II diabetes mellitus [facility])     Patient Active Problem List   Diagnosis Date Noted   Asymptomatic stenosis of right carotid artery 10/04/2018   Abdominal aortic aneurysm (AAA) 30 to 34 mm in diameter (HCC) 10/04/2018   Moderate obesity 10/04/2018   Osteoarthritis of right knee 12/19/2014   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/19/2014   Arthrofibrosis of total knee arthroplasty, left 10/17/2014   Arthritis of right knee 08/22/2014   Status post total left knee replacement 08/22/2014   Essential hypertension 02/22/2013   HLD (hyperlipidemia) 02/22/2013   Diabetes (HCC) 02/22/2013    Past Surgical History:  Procedure Laterality Date   CATARACT EXTRACTION     KNEE ARTHROSCOPY  Left 1990's   KNEE CLOSED REDUCTION Left 10/17/2014   Procedure: CLOSED MANIPULATION LEFT KNEE UNDER ANESTHESIA;  Surgeon: Kathryne Hitch, MD;  Location: [facility];  Service: Orthopedics;  Laterality: Left;   KNEE CLOSED REDUCTION Left 12/19/2014   Procedure: CLOSED MANIPULATION  LEFT KNEE;  Surgeon: Kathryne Hitch, MD;  Location: [facility];  Service: Orthopedics;  Laterality: Left;   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   Procedure: LEFT TOTAL KNEE ARTHROPLASTY;  Surgeon: Kathryne Hitch, MD;  Location: [facility];  Service: Orthopedics;  Laterality: Left;   TOTAL KNEE ARTHROPLASTY Right 12/19/2014   Procedure: RIGHT TOTAL KNEE ARTHROPLASTY;  Surgeon: Kathryne Hitch, MD;  Location: [facility];  Service: Orthopedics;  Laterality: Right;       Family History  Problem Relation Age of Onset   CAD Father    Heart attack Father    Stroke Mother    Anuerysm Mother     Social History   Tobacco Use   Smoking status: Never   Smokeless tobacco: Never  Vaping Use   Vaping Use: Never used  Substance Use Topics   Alcohol use: No   Drug use: No    Home Medications Prior to Admission medications   Medication Sig Start Date End Date Taking? Authorizing Provider  amLODipine (NORVASC) 10 MG tablet Take 10 mg  by mouth every morning.     [provider]  aspirin EC 81 MG tablet Take 81 mg by mouth daily.    [provider]  cloNIDine (CATAPRES - DOSED IN MG/24 HR) 0.2 mg/24hr patch PLACE 1 PATCH ONTO THE SKIN ONCE A WEEK. 09/17/20   Yates Decamp, MD  labetalol (NORMODYNE) 200 MG tablet TAKE 1 TABLET BY MOUTH 2 TIMES DAILY. STOP TAKING METOPROLOL SUCCINATE. 02/11/21   Yates Decamp, MD  metFORMIN (GLUCOPHAGE) 1000 MG tablet Take 1,000 mg by mouth 2 (two) times daily with a meal.    [provider]  OZEMPIC, 1 MG/DOSE, 2 MG/1.5ML SOPN 1 Units once a week. 03/20/19   [provider]  simvastatin (ZOCOR) 10 MG tablet Take 10 mg by mouth at bedtime.     [provider]  valsartan-hydrochlorothiazide (DIOVAN-HCT) 320-12.5 MG tablet TAKE 1 TABLET BY MOUTH EVERY MORNING 03/30/21   Yates Decamp, MD  Vitamin D, Ergocalciferol, (DRISDOL) 1.25 MG (50000 UNIT) CAPS capsule Take 50,000 Units by mouth once a week. 06/27/19   [provider]    Allergies    Patient has no known allergies.  Review of Systems   Review of Systems  Constitutional:  Negative for appetite change, chills and fever.  HENT:  Negative for ear pain, rhinorrhea, sneezing and sore throat.   Eyes:  Negative for photophobia and visual disturbance.  Respiratory:  Negative for cough, chest tightness, shortness of breath and wheezing.   Cardiovascular:  Negative for chest pain and palpitations.  Gastrointestinal:  Negative for abdominal pain, blood in stool, constipation, diarrhea, nausea and vomiting.  Genitourinary:  Negative for dysuria, hematuria and urgency.  Musculoskeletal:  Positive for myalgias.  Skin:  Negative for rash.  Neurological:  Negative for dizziness, weakness and light-headedness.   Physical Exam Updated Vital Signs BP 122/70 (BP Location: Left Arm)   Pulse 84   Temp 98.1 F (36.7 C) (Oral)   Resp 16   Ht 5\' 10"  (1.778 m)   Wt 101 kg   SpO2 95%   BMI 31.95 kg/m   Physical Exam Vitals and nursing note reviewed.  Constitutional:      General: He is not in acute distress.    Appearance: He is well-developed.  HENT:     Head: Normocephalic and atraumatic.     Nose: Nose normal.  Eyes:     General: No scleral icterus.       Left eye: No discharge.     Conjunctiva/sclera: Conjunctivae normal.  Cardiovascular:     Rate and Rhythm: Normal rate and regular rhythm.     Heart sounds: Normal heart sounds. No murmur heard.   No friction rub. No gallop.  Pulmonary:     Effort: Pulmonary effort is normal. No respiratory distress.     Breath sounds: Normal breath sounds.  Abdominal:     General: Bowel sounds are normal. There is no  distension.     Palpations: Abdomen is soft.     Tenderness: There is no abdominal tenderness. There is no guarding.  Musculoskeletal:        General: Normal range of motion.     Cervical back: Normal range of motion and neck supple.     Comments: 2+ DP pulse noted bilaterally.  No calf tenderness of right extremity.  No erythema, edema or warmth noted.  Skin:    General: Skin is warm and dry.     Findings: No rash.  Neurological:     Mental  Status: He is alert.     Motor: No abnormal muscle tone.     Coordination: Coordination normal.    ED Results / Procedures / Treatments   Labs (all labs ordered are listed, but only abnormal results are displayed) Labs Reviewed - No data to display  EKG None  Radiology VAS [place] LOWER EXTREMITY VENOUS (DVT) (ONLY [facility] & [facility] 7a-7p)  Result Date: 04/15/2021  Lower Venous DVT Study Patient Name:  Christopher Rasmussen  Date of Exam:   04/15/2021 Medical Rec #: [id number]      Accession #:    [id number] Date of Birth: 1945/09/23     Patient Gender: M Patient Age:   71 years Exam Location:  [facility] Procedure:      VAS [place] LOWER EXTREMITY VENOUS (DVT) Referring Phys: Analina Filla --------------------------------------------------------------------------------  Indications: Pain in RT calf x2 days.  Limitations: Poor ultrasound/tissue interface. Comparison Study: No prior studies. Performing Technologist: Jean Rosenthal RDMS, RVT  Examination Guidelines: A complete evaluation includes B-mode imaging, spectral Doppler, color Doppler, and power Doppler as needed of all accessible portions of each vessel. Bilateral testing is considered an integral part of a complete examination. Limited examinations for reoccurring indications may be performed as noted. The reflux portion of the exam is performed with the patient in reverse Trendelenburg.  +---------+---------------+---------+-----------+----------+-------------------+ RIGHT     CompressibilityPhasicitySpontaneityPropertiesThrombus Aging      +---------+---------------+---------+-----------+----------+-------------------+ CFV      Full           Yes      Yes                                      +---------+---------------+---------+-----------+----------+-------------------+ SFJ      Full                                                             +---------+---------------+---------+-----------+----------+-------------------+ FV Prox  Full                                                             +---------+---------------+---------+-----------+----------+-------------------+ FV Mid   Full                                                             +---------+---------------+---------+-----------+----------+-------------------+ FV DistalFull                                                             +---------+---------------+---------+-----------+----------+-------------------+ PFV      Full                                                             +---------+---------------+---------+-----------+----------+-------------------+  POP      Full           Yes      Yes                                      +---------+---------------+---------+-----------+----------+-------------------+ PTV      Full                                                             +---------+---------------+---------+-----------+----------+-------------------+ PERO                                                  Not well visualized +---------+---------------+---------+-----------+----------+-------------------+ Gastroc  Full                                                             +---------+---------------+---------+-----------+----------+-------------------+ GSV      Full                                                             +---------+---------------+---------+-----------+----------+-------------------+     Summary:  RIGHT: - There is no evidence of deep vein thrombosis in the lower extremity. However, portions of this examination were limited- see technologist comments above.  - No cystic structure found in the popliteal fossa.   *See table(s) above for measurements and observations.    Preliminary     Procedures Procedures   Medications Ordered in ED Medications - No data to display  ED Course  I have reviewed the triage vital signs and the nursing notes.  Pertinent labs & imaging results that were available during my care of the patient were reviewed by me and considered in my medical decision making (see chart for details).    MDM Rules/Calculators/A&P                           75 year old male with past medical history of hypertension, diabetes and AAA presenting to the ED for right calf pain.  Symptoms have been going on for the past 2 days without specific trigger.  His symptoms have significantly improved with Flexeril and hydrocodone given by his PCP.  However he was sent to the ER for an ultrasound to rule out a DVT due to still having pain.  On exam there is no significant edema, erythema or warmth of extremity.  No calf tenderness noted.  Equal and intact distal pulses of bilateral lower extremity.  His vital signs are within normal limits, he is not endorsing any chest pain or shortness of breath.  He has no history of DVT or PE.  No fever.  Vascular ultrasound here is negative for DVT in  the right lower extremity. Suspect musculoskeletal cause.  No overlying skin changes that would concern me for an infectious cause.  Patient is agreeable to following up with PCP.  He remains ambulatory here.  Return precautions given.   Patient is hemodynamically stable, in NAD, and able to ambulate in the ED. Evaluation does not show pathology that would require ongoing emergent intervention or inpatient treatment. I explained the diagnosis to the patient. Pain has been managed and has no complaints prior to  discharge. Patient is comfortable with above plan and is stable for discharge at this time. All questions were answered prior to disposition. Strict return precautions for returning to the ED were discussed. Encouraged follow up with PCP.   An After Visit Summary was printed and given to the patient.   Portions of this note were generated with Scientist, clinical (histocompatibility and immunogenetics)Dragon dictation software. Dictation errors may occur despite best attempts at proofreading.  Final Clinical Impression(s) / ED Diagnoses Final diagnoses:  Strain of calf muscle, right, initial encounter    Rx / DC Orders ED Discharge Orders     None        Dietrich PatesKhatri, Lian Pounds, PA-C 04/15/21 1211    Kommor, Wyn ForsterMadison, MD 04/15/21 1627

## 2021-04-15 NOTE — ED Notes (Signed)
Vascular Tech with pt.

## 2021-04-15 NOTE — Progress Notes (Signed)
Lower extremity venous RT study completed.  Preliminary results relayed to PA in ED.  See CV Proc for preliminary results report.   Jean Rosenthal, RDMS, RVT

## 2021-06-09 ENCOUNTER — Ambulatory Visit: Payer: Medicare PPO

## 2021-06-09 ENCOUNTER — Other Ambulatory Visit: Payer: Self-pay

## 2021-06-09 DIAGNOSIS — I6521 Occlusion and stenosis of right carotid artery: Secondary | ICD-10-CM

## 2021-06-13 NOTE — Progress Notes (Signed)
No change in carotid artery stenosis of 50 to 60% on the right and very minimal on the left.  I have ordered for repeat study in 6 months, he has an appointment to see me in May 2023, I will have the test done prior to this, please let the front staff know.  The study is unchanged from prior study 6 months ago.

## 2021-06-17 NOTE — Progress Notes (Signed)
Called patient, NA, Vmbox is full, cnnot leave a message.

## 2021-06-18 NOTE — Progress Notes (Signed)
Called and spoke with patient regarding his CAD results.

## 2021-08-10 ENCOUNTER — Other Ambulatory Visit: Payer: Self-pay | Admitting: Cardiology

## 2021-08-10 DIAGNOSIS — I10 Essential (primary) hypertension: Secondary | ICD-10-CM

## 2021-09-28 ENCOUNTER — Other Ambulatory Visit: Payer: Self-pay | Admitting: Cardiology

## 2021-09-28 DIAGNOSIS — I10 Essential (primary) hypertension: Secondary | ICD-10-CM

## 2021-11-26 ENCOUNTER — Encounter: Payer: Self-pay | Admitting: Physician Assistant

## 2021-11-26 ENCOUNTER — Ambulatory Visit (INDEPENDENT_AMBULATORY_CARE_PROVIDER_SITE_OTHER): Payer: Medicare PPO | Admitting: Physician Assistant

## 2021-11-26 VITALS — BP 138/74 | HR 74 | Temp 97.8°F | Ht 69.0 in | Wt 218.0 lb

## 2021-11-26 DIAGNOSIS — Z1321 Encounter for screening for nutritional disorder: Secondary | ICD-10-CM | POA: Diagnosis not present

## 2021-11-26 DIAGNOSIS — I10 Essential (primary) hypertension: Secondary | ICD-10-CM

## 2021-11-26 DIAGNOSIS — Z13228 Encounter for screening for other metabolic disorders: Secondary | ICD-10-CM | POA: Diagnosis not present

## 2021-11-26 DIAGNOSIS — Z130 Encounter for screening for diseases of the blood and blood-forming organs and certain disorders involving the immune mechanism: Secondary | ICD-10-CM

## 2021-11-26 DIAGNOSIS — Z1329 Encounter for screening for other suspected endocrine disorder: Secondary | ICD-10-CM | POA: Diagnosis not present

## 2021-11-26 DIAGNOSIS — I6521 Occlusion and stenosis of right carotid artery: Secondary | ICD-10-CM

## 2021-11-26 DIAGNOSIS — E1165 Type 2 diabetes mellitus with hyperglycemia: Secondary | ICD-10-CM

## 2021-11-26 DIAGNOSIS — E782 Mixed hyperlipidemia: Secondary | ICD-10-CM | POA: Diagnosis not present

## 2021-11-26 DIAGNOSIS — I714 Abdominal aortic aneurysm, without rupture, unspecified: Secondary | ICD-10-CM

## 2021-11-26 DIAGNOSIS — Z125 Encounter for screening for malignant neoplasm of prostate: Secondary | ICD-10-CM

## 2021-11-26 DIAGNOSIS — Z7689 Persons encountering health services in other specified circumstances: Secondary | ICD-10-CM

## 2021-11-26 LAB — POCT GLYCOSYLATED HEMOGLOBIN (HGB A1C): Hemoglobin A1C: 8 % — AB (ref 4.0–5.6)

## 2021-11-26 NOTE — Assessment & Plan Note (Signed)
-  A1c today 8.0, per pt previously 9.0. Will request records. Discussed with patient repeating A1c in 4 months and if remains >7.5 then recommend increasing Ozempic to 2.0 mg per week. Pt verbalized understanding. Will continue current medication regimen. Recommend to reduce diet sodas. Will continue to monitor. ?

## 2021-11-26 NOTE — Progress Notes (Signed)
? ?New Patient Office Visit ? ?Subjective   ? ?Patient ID: Christopher Rasmussen, male    DOB: 1946/08/05  Age: 76 y.o. MRN: [id number] ? ?CC:  ?Chief Complaint  ?Patient presents with  ? New Patient (Initial Visit)  ? ? ?HPI ?Christopher Rasmussen presents to establish care.  Patient has a past medical history of type 2 diabetes mellitus, hypertension, hyperlipidemia, aortic stenosis, carotid stenosis, AAA and arthritis.  Patient is followed by cardiology.  Takes metformin 1000 mg twice daily and Ozempic 1 mg once a week for diabetes mellitus, takes simvastatin 10 mg for hyperlipidemia.  Takes amlodipine 10 mg, clonidine 0.2 mg weekly, labetalol 200 mg twice daily and valsartan HCTZ 320-12.5 mg daily.  Patient reports previous PCP recommended taking vitamin D 50,000 units once a week, denies prior history of vitamin D deficiency.  Patient has never been a smoker and no alcohol use.  Drinks 4 cups of caffeine per day (diet sodas).  Patient reports it has been at least a year since he has had any blood work or physical done.  Is fasting today. ? ? ?Outpatient Encounter Medications as of 11/26/2021  ?Medication Sig  ? amLODipine (NORVASC) 10 MG tablet Take 10 mg by mouth every morning.   ? aspirin EC 81 MG tablet Take 81 mg by mouth daily.  ? cloNIDine (CATAPRES - DOSED IN MG/24 HR) 0.2 mg/24hr patch PLACE 1 PATCH ONTO THE SKIN ONCE A WEEK.  ? labetalol (NORMODYNE) 200 MG tablet TAKE 1 TABLET BY MOUTH 2 TIMES DAILY. STOP TAKING METOPROLOL SUCCINATE.  ? metFORMIN (GLUCOPHAGE) 1000 MG tablet Take 1,000 mg by mouth 2 (two) times daily with a meal.  ? OZEMPIC, 1 MG/DOSE, 2 MG/1.5ML SOPN 1 Units once a week.  ? simvastatin (ZOCOR) 10 MG tablet Take 10 mg by mouth at bedtime.  ? valsartan-hydrochlorothiazide (DIOVAN-HCT) 320-12.5 MG tablet TAKE 1 TABLET BY MOUTH EVERY MORNING  ? Vitamin D, Ergocalciferol, (DRISDOL) 1.25 MG (50000 UNIT) CAPS capsule Take 50,000 Units by mouth once a week.  ? ?No facility-administered encounter medications on  file as of 11/26/2021.  ? ? ?Past Medical History:  ?Diagnosis Date  ? Abdominal aortic aneurysm (AAA) (HCC)   ? Abdominal aortic aneurysm (AAA) 30 to 34 mm in diameter (HCC) 10/04/2018  ? Aortic stenosis   ? Arthritis   ? Asymptomatic stenosis of right carotid artery 10/04/2018  ? Carotid stenosis   ? Heart block   ? High cholesterol   ? under control  ? Hypertension   ? Type II diabetes mellitus (HCC)   ? ? ?Past Surgical History:  ?Procedure Laterality Date  ? CATARACT EXTRACTION    ? KNEE ARTHROSCOPY Left 1990's  ? KNEE CLOSED REDUCTION Left 10/17/2014  ? Procedure: CLOSED MANIPULATION LEFT KNEE UNDER ANESTHESIA;  Surgeon: Kathryne Hitch, MD;  Location: [facility];  Service: Orthopedics;  Laterality: Left;  ? KNEE CLOSED REDUCTION Left 12/19/2014  ? Procedure: CLOSED MANIPULATION  LEFT KNEE;  Surgeon: Kathryne Hitch, MD;  Location: [facility];  Service: Orthopedics;  Laterality: Left;  ? 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  ? Procedure: LEFT TOTAL KNEE ARTHROPLASTY;  Surgeon: Kathryne Hitch, MD;  Location: [facility];  Service: Orthopedics;  Laterality: Left;  ? TOTAL KNEE ARTHROPLASTY Right 12/19/2014  ? Procedure: RIGHT TOTAL KNEE ARTHROPLASTY;  Surgeon: Kathryne Hitch, MD;  Location: [facility];  Service: Orthopedics;  Laterality: Right;  ? ? ?Family History  ?Problem Relation Age of Onset  ? CAD Father   ?  Heart attack Father   ? Stroke Mother   ? Anuerysm Mother   ? ? ?Social History  ? ?Socioeconomic History  ? Marital status: Married  ?  Spouse name: Docia BarrierJill Mazurkiewicz  ? Number of children: 2  ? Years of education: Not on file  ? Highest education level: Not on file  ?Occupational History  ? Not on file  ?Tobacco Use  ? Smoking status: Never  ? Smokeless tobacco: Never  ?Vaping Use  ? Vaping Use: Never used  ?Substance and Sexual Activity  ? Alcohol use: No  ? Drug use: No  ? Sexual activity: Not Currently  ?  Birth control/protection: None  ?Other Topics Concern  ? Not on file  ?Social History  Narrative  ? Not on file  ? ?Social Determinants of Health  ? ?Financial Resource Strain: Not on file  ?Food Insecurity: Not on file  ?Transportation Needs: Not on file  ?Physical Activity: Not on file  ?Stress: Not on file  ?Social Connections: Not on file  ?Intimate Partner Violence: Not on file  ? ? ?ROS ?Review of Systems:  ?A fourteen system review of systems was performed and found to be positive as per HPI. ? ?  ? ? ?Objective   ? ?BP 138/74   Pulse 74   Temp 97.8 ?F (36.6 ?C)   Ht 5\' 9"  (1.753 m)   Wt 218 lb (98.9 kg)   SpO2 98%   BMI 32.19 kg/m?  ? ?Physical Exam ?General:  Well Developed, well nourished, appropriate for stated age.  ?Neuro:  Alert and oriented,  extra-ocular muscles intact  ?HEENT:  Normocephalic, atraumatic, neck supple  ?Skin:  no gross rash, warm, pink. ?Cardiac:  RRR, S1 S2, +systolic murmur ?Respiratory: CTA B/L  ?Vascular:  Ext warm, no cyanosis apprec.; cap RF less 2 sec. ?Psych:  No HI/SI, judgement and insight good, Euthymic mood. Full Affect. ? ? ?  ? ?Assessment & Plan:  ? ?Problem List Items Addressed This Visit   ? ?  ? Cardiovascular and Mediastinum  ? Essential hypertension - Primary  ?  -BP in office stable. Continue current medication regimen. See med list. Will collect CMP to monitor renal function and electrolytes. Will continue to monitor. ? ?  ?  ? Relevant Orders  ? Comprehensive metabolic panel  ? Asymptomatic stenosis of right carotid artery  ?  -Followed by cardiology.  ?-Reviewed consult 12/07/20. ?-Carotid artery duplex 06/09/2021  ?Duplex suggests stenosis in the right internal carotid artery (50-69%).  ?Duplex suggests stenosis in the left internal carotid artery (1-15%).  ?Duplex suggests stenosis in the right common carotid artery (<50%).  ?Both vertebral artery flow were not visualized.  ?Follow up in six months is appropriate if clinically indicated.  ?Compared to study 11/13/2020 no significant change.  ? ?  ?  ? Abdominal aortic aneurysm (AAA) 30 to  34 mm in diameter [facility](HCC)  ?  -Followed by cardiology. ?-Abdominal Aortic Duplex  04/17/2019:  ?The maximum aorta (sac) diameter is 2.23 cm (prox). Mixed atherosclerotic  ?plaque. Normal flow velocities noted in the aorta. Iliac aretires could  ?not be visualized due to body habitus.  ?No AAA noted.  ? ?  ?  ?  ? Endocrine  ? Diabetes (HCC)  ?  -A1c today 8.0, per pt previously 9.0. Will request records. Discussed with patient repeating A1c in 4 months and if remains >7.5 then recommend increasing Ozempic to 2.0 mg per week. Pt verbalized understanding. Will continue current medication  regimen. Recommend to reduce diet sodas. Will continue to monitor. ? ?  ?  ? Relevant Orders  ? POCT glycosylated hemoglobin (Hb A1C) (Completed)  ? Comprehensive metabolic panel  ?  ? Other  ? HLD (hyperlipidemia)  ?  -Will collect fasting lipid panel. Will continue simvastatin 10 mg. Will continue to monitor. ? ?  ?  ? Relevant Orders  ? Lipid panel  ? ?Other Visit Diagnoses   ? ? Screening for endocrine, nutritional, metabolic and immunity disorder      ? Relevant Orders  ? POCT glycosylated hemoglobin (Hb A1C) (Completed)  ? CBC with Differential/Platelet  ? Comprehensive metabolic panel  ? Lipid panel  ? TSH  ? Screening for prostate cancer      ? Relevant Orders  ? PSA  ? Encounter for vitamin deficiency screening      ? Relevant Orders  ? Vitamin D (25 hydroxy)  ? Encounter to establish care      ? ?  ? ?Encounter for vitamin deficiency screening: ?-Patient on high dose Vit D, denies prior hx of deficiency. Will collect Vit D. Pending lab results will make treatment adjustments or continue with Vit D once a week supplement. ? ?Return in about 4 months (around 03/28/2022) for CPE.  ? ?Mayer Masker, PA-C ? ? ?

## 2021-11-26 NOTE — Assessment & Plan Note (Signed)
-  Will collect fasting lipid panel. Will continue simvastatin 10 mg. Will continue to monitor. ?

## 2021-11-26 NOTE — Patient Instructions (Signed)

## 2021-11-26 NOTE — Assessment & Plan Note (Addendum)
-  Followed by cardiology.  ?-Reviewed consult 12/07/20. ?-Carotid artery duplex 06/09/2021  ?Duplex suggests stenosis in the right internal carotid artery (50-69%).  ?Duplex suggests stenosis in the left internal carotid artery (1-15%).  ?Duplex suggests stenosis in the right common carotid artery (<50%).  ?Both vertebral artery flow were not visualized.  ?Follow up in six months is appropriate if clinically indicated.  ?Compared to study 11/13/2020 no significant change.  ?

## 2021-11-26 NOTE — Assessment & Plan Note (Signed)
-  Followed by cardiology. ?-Abdominal Aortic Duplex ?04/17/2019:  ?The maximum aorta (sac) diameter is 2.23 cm (prox). Mixed atherosclerotic  ?plaque. Normal flow velocities noted in the aorta. Iliac aretires could  ?not be visualized due to body habitus.  ?No AAA noted.  ?

## 2021-11-26 NOTE — Assessment & Plan Note (Signed)
-  BP in office stable. Continue current medication regimen. See med list. Will collect CMP to monitor renal function and electrolytes. Will continue to monitor. ?

## 2021-11-27 LAB — CBC WITH DIFFERENTIAL/PLATELET
Basophils Absolute: 0 10*3/uL (ref 0.0–0.2)
Basos: 1 %
EOS (ABSOLUTE): 0.4 10*3/uL (ref 0.0–0.4)
Eos: 6 %
Hematocrit: 45 % (ref 37.5–51.0)
Hemoglobin: 15.8 g/dL (ref 13.0–17.7)
Immature Grans (Abs): 0 10*3/uL (ref 0.0–0.1)
Immature Granulocytes: 0 %
Lymphocytes Absolute: 1.8 10*3/uL (ref 0.7–3.1)
Lymphs: 30 %
MCH: 30.4 pg (ref 26.6–33.0)
MCHC: 35.1 g/dL (ref 31.5–35.7)
MCV: 87 fL (ref 79–97)
Monocytes Absolute: 0.5 10*3/uL (ref 0.1–0.9)
Monocytes: 7 %
Neutrophils Absolute: 3.4 10*3/uL (ref 1.4–7.0)
Neutrophils: 56 %
Platelets: 188 10*3/uL (ref 150–450)
RBC: 5.2 x10E6/uL (ref 4.14–5.80)
RDW: 13.6 % (ref 11.6–15.4)
WBC: 6.1 10*3/uL (ref 3.4–10.8)

## 2021-11-27 LAB — COMPREHENSIVE METABOLIC PANEL
ALT: 22 IU/L (ref 0–44)
AST: 18 IU/L (ref 0–40)
Albumin/Globulin Ratio: 2.1 (ref 1.2–2.2)
Albumin: 4.7 g/dL (ref 3.7–4.7)
Alkaline Phosphatase: 73 IU/L (ref 44–121)
BUN/Creatinine Ratio: 20 (ref 10–24)
BUN: 16 mg/dL (ref 8–27)
Bilirubin Total: 0.9 mg/dL (ref 0.0–1.2)
CO2: 23 mmol/L (ref 20–29)
Calcium: 9.7 mg/dL (ref 8.6–10.2)
Chloride: 103 mmol/L (ref 96–106)
Creatinine, Ser: 0.82 mg/dL (ref 0.76–1.27)
Globulin, Total: 2.2 g/dL (ref 1.5–4.5)
Glucose: 173 mg/dL — ABNORMAL HIGH (ref 70–99)
Potassium: 4.3 mmol/L (ref 3.5–5.2)
Sodium: 142 mmol/L (ref 134–144)
Total Protein: 6.9 g/dL (ref 6.0–8.5)
eGFR: 92 mL/min/{1.73_m2} (ref >59)

## 2021-11-27 LAB — LIPID PANEL
Chol/HDL Ratio: 3.8 ratio (ref 0.0–5.0)
Cholesterol, Total: 132 mg/dL (ref 100–199)
HDL: 35 mg/dL — ABNORMAL LOW (ref >39)
LDL Chol Calc (NIH): 76 mg/dL (ref 0–99)
Triglycerides: 117 mg/dL (ref 0–149)
VLDL Cholesterol Cal: 21 mg/dL (ref 5–40)

## 2021-11-27 LAB — VITAMIN D 25 HYDROXY (VIT D DEFICIENCY, FRACTURES): Vit D, 25-Hydroxy: 60.2 ng/mL (ref 30.0–100.0)

## 2021-11-27 LAB — PSA: Prostate Specific Ag, Serum: 1.2 ng/mL (ref 0.0–4.0)

## 2021-11-27 LAB — TSH: TSH: 1.42 u[IU]/mL (ref 0.450–4.500)

## 2021-12-08 ENCOUNTER — Encounter: Payer: Self-pay | Admitting: Cardiology

## 2021-12-08 ENCOUNTER — Ambulatory Visit: Payer: Medicare PPO | Admitting: Cardiology

## 2021-12-08 VITALS — BP 119/69 | HR 81 | Temp 98.0°F | Resp 16 | Ht 69.0 in | Wt 221.6 lb

## 2021-12-08 DIAGNOSIS — I6521 Occlusion and stenosis of right carotid artery: Secondary | ICD-10-CM

## 2021-12-08 DIAGNOSIS — I10 Essential (primary) hypertension: Secondary | ICD-10-CM

## 2021-12-08 DIAGNOSIS — E1165 Type 2 diabetes mellitus with hyperglycemia: Secondary | ICD-10-CM

## 2021-12-08 DIAGNOSIS — I350 Nonrheumatic aortic (valve) stenosis: Secondary | ICD-10-CM

## 2021-12-08 NOTE — Progress Notes (Signed)
? ?Primary Physician/Referring:  Lorrene Reid, PA-C ? ?Patient ID: Christopher Rasmussen, male    DOB: 05-06-1946, 76 y.o.   MRN: [id number] ? ?Chief Complaint  ?Patient presents with  ? Carotid Artery Stenosis  ? Hyperlipidemia  ? Follow-up  ?  1 year  ? ?HPI:   ? ?Christopher Rasmussen  is a 76 y.o. Caucasian male with mild aortic stenosis, uncontrolled diabetes mellitus, moderate obesity, hypertension and hyperlipidemia presents here for annual visit for management of carotid stenosis and mild aortic stenosis. ? ?He is presently doing well, essentially remains asymptomatic.  ? ?Past Medical History:  ?Diagnosis Date  ? Abdominal aortic aneurysm (AAA) ([place])   ? Abdominal aortic aneurysm (AAA) 30 to 34 mm in diameter ([place]) 10/04/2018  ? Aortic stenosis   ? Arthritis   ? Asymptomatic stenosis of right carotid artery 10/04/2018  ? Carotid stenosis   ? Heart block   ? High cholesterol   ? under control  ? Hypertension   ? Type II diabetes mellitus ([place])   ? ?Past Surgical History:  ?Procedure Laterality Date  ? CATARACT EXTRACTION    ? KNEE ARTHROSCOPY Left 1990's  ? KNEE CLOSED REDUCTION Left 10/17/2014  ? Procedure: CLOSED MANIPULATION LEFT KNEE UNDER ANESTHESIA;  Surgeon: Mcarthur Rossetti, MD;  Location: [facility];  Service: Orthopedics;  Laterality: Left;  ? KNEE CLOSED REDUCTION Left 12/19/2014  ? Procedure: CLOSED MANIPULATION  LEFT KNEE;  Surgeon: Mcarthur Rossetti, MD;  Location: [facility];  Service: Orthopedics;  Laterality: Left;  ? 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  ? Procedure: LEFT TOTAL KNEE ARTHROPLASTY;  Surgeon: Mcarthur Rossetti, MD;  Location: [facility];  Service: Orthopedics;  Laterality: Left;  ? TOTAL KNEE ARTHROPLASTY Right 12/19/2014  ? Procedure: RIGHT TOTAL KNEE ARTHROPLASTY;  Surgeon: Mcarthur Rossetti, MD;  Location: [facility];  Service: Orthopedics;  Laterality: Right;  ? ?Family History  ?Problem Relation Age of Onset  ? CAD Father   ? Heart attack Father   ? Stroke Mother   ? Anuerysm Mother    ?  ?Social History  ? ?Tobacco Use  ? Smoking status: Never  ? Smokeless tobacco: Never  ?Substance Use Topics  ? Alcohol use: No  ? ?Marital Status: Married  ?ROS  ?Review of Systems  ?Cardiovascular:  Negative for chest pain, dyspnea on exertion and leg swelling.  ?Musculoskeletal:  Positive for joint pain.  ?Gastrointestinal:  Negative for melena.  ?Objective  ?Blood pressure 119/69, pulse 81, temperature 98 ?F (36.7 ?C), temperature source Temporal, resp. rate 16, height 5' 9"  (1.753 m), weight 221 lb 9.6 oz (100.5 kg), SpO2 94 %.  ? ?  12/08/2021  ?  1:27 PM 11/26/2021  ?  9:57 AM 04/15/2021  ?  9:23 AM  ?Vitals with BMI  ?Height 5' 9"  5' 9"  5' 10"   ?Weight 221 lbs 10 oz 218 lbs 222 lbs 11 oz  ?BMI 32.71 32.18 31.95  ?Systolic 774 128   ?Diastolic 69 74   ?Pulse 81 74   ?  ? Physical Exam ?Constitutional:   ?   Appearance: He is well-developed.  ?   Comments: moderately obese  ?Neck:  ?   Vascular: No JVD.  ?Cardiovascular:  ?   Rate and Rhythm: Normal rate and regular rhythm.  ?   Pulses: Intact distal pulses.     ?     Carotid pulses are  on the right side with bruit and  on the left side with bruit. ?  Heart sounds: S1 normal and S2 normal. Murmur heard.  ?Early systolic murmur is present with a grade of 2/6 at the upper right sternal border. No JVD, No leg edema   ?  No gallop.  ?Pulmonary:  ?   Effort: Pulmonary effort is normal. No accessory muscle usage or respiratory distress.  ?   Breath sounds: Normal breath sounds.  ?Abdominal:  ?   General: Bowel sounds are normal.  ?   Palpations: Abdomen is soft.  ?   Hernia: A hernia is present. Hernia is present in the ventral area (Small and reducible).  ?Musculoskeletal:  ?   Right lower leg: No edema.  ?   Left lower leg: No edema.  ? ?Laboratory examination:  ? ? ?  Latest Ref Rng & Units 11/26/2021  ? 10:21 AM 12/22/2014  ?  5:28 AM 12/21/2014  ?  4:56 AM  ?CMP  ?Glucose 70 - 99 mg/dL 173   133   177    ?BUN 8 - 27 mg/dL 16   13   37    ?Creatinine 0.76 - 1.27  mg/dL 0.82   0.60   1.27    ?Sodium 134 - 144 mmol/L 142   138   133    ?Potassium 3.5 - 5.2 mmol/L 4.3   3.8   4.5    ?Chloride 96 - 106 mmol/L 103   103   99    ?CO2 20 - 29 mmol/L 23   23   26     ?Calcium 8.6 - 10.2 mg/dL 9.7   8.5   8.8    ?Total Protein 6.0 - 8.5 g/dL 6.9      ?Total Bilirubin 0.0 - 1.2 mg/dL 0.9      ?Alkaline Phos 44 - 121 IU/L 73      ?AST 0 - 40 IU/L 18      ?ALT 0 - 44 IU/L 22      ? ? ?  Latest Ref Rng & Units 11/26/2021  ? 10:21 AM 12/22/2014  ?  5:28 AM 12/21/2014  ?  4:56 AM  ?CBC  ?WBC 3.4 - 10.8 x10E3/uL 6.1   7.5   11.4    ?Hemoglobin 13.0 - 17.7 g/dL 15.8   11.5   10.6    ?Hematocrit 37.5 - 51.0 % 45.0   32.9   32.3    ?Platelets 150 - 450 x10E3/uL 188   156   170    ? ?Lipid Panel  ?   ?Component Value Date/Time  ? CHOL 132 11/26/2021 1021  ? TRIG 117 11/26/2021 1021  ? HDL 35 (L) 11/26/2021 1021  ? CHOLHDL 3.8 11/26/2021 1021  ? CHOLHDL 4.1 02/23/2013 0605  ? VLDL 53 (H) 02/23/2013 9179  ? [place] 76 11/26/2021 1021  ? ?HEMOGLOBIN A1C ?Lab Results  ?Component Value Date  ? HGBA1C 8.0 (A) 11/26/2021  ? MPG 255 (H) 08/24/2014  ? ?TSH ?Recent Labs  ?  11/26/21 ?1021  ?TSH 1.420  ?   ? ?Medications and allergies  ?No Known Allergies  ? ?Current Outpatient Medications:  ?  amLODipine (NORVASC) 10 MG tablet, Take 10 mg by mouth every morning. , Disp: , Rfl:  ?  aspirin EC 81 MG tablet, Take 81 mg by mouth daily., Disp: , Rfl:  ?  cloNIDine (CATAPRES - DOSED IN MG/24 HR) 0.2 mg/24hr patch, PLACE 1 PATCH ONTO THE SKIN ONCE A WEEK., Disp: 4 patch, Rfl: 12 ?  labetalol (NORMODYNE) 200 MG tablet,  TAKE 1 TABLET BY MOUTH 2 TIMES DAILY. STOP TAKING METOPROLOL SUCCINATE., Disp: 180 tablet, Rfl: 1 ?  metFORMIN (GLUCOPHAGE) 1000 MG tablet, Take 1,000 mg by mouth 2 (two) times daily with a meal., Disp: , Rfl:  ?  OZEMPIC, 1 MG/DOSE, 2 MG/1.5ML SOPN, 1 Units once a week., Disp: , Rfl:  ?  simvastatin (ZOCOR) 10 MG tablet, Take 10 mg by mouth at bedtime., Disp: , Rfl:  ?  valsartan-hydrochlorothiazide  (DIOVAN-HCT) 320-12.5 MG tablet, TAKE 1 TABLET BY MOUTH EVERY MORNING, Disp: 90 tablet, Rfl: 2 ?  Vitamin D, Ergocalciferol, (DRISDOL) 1.25 MG (50000 UNIT) CAPS capsule, Take 50,000 Units by mouth once a week., Disp: , Rfl:   ?  ?Radiology:  ? ?No results found. ? ?Cardiac Studies:  ? ?Exercise Myoview stress test 03/04/2013: ?1. Resting EKG showed normal sinus rhythm, poor R wave progression, pulmonary disease pattern.  Patient exercised on Bruce protocol for 4 minutes 31 sec. The maximum work level achieved was 7.3 MET?s.  The test was terminated due to achievement of the target heart rate. ?2. Perfusion imaging studies demonstrate mild diaphragmatic attenuation artifact in the inferior wall.  There was no evidence of ischemia or infarct. Dynamic gated images reveal normal wall motion and endocardial thickening. Left ventricular ejection fraction was estimated to be 58%. This is a low risk stress test. ? ?Abdominal Aortic Duplex  04/17/2019:  ?The maximum aorta (sac) diameter is 2.23 cm (prox). Mixed atherosclerotic plaque. Normal flow velocities noted in the aorta. Iliac aretires could not be visualized due to body habitus.  ?No AAA noted.  ? ?Echocardiogram 12/17/2019:   ?Normal LV systolic function with EF 55%. Left ventricle cavity is normal in size. Moderate concentric hypertrophy of the left ventricle. Normal global wall motion. Normal diastolic filling pattern. Calculated EF 55%. ?Trileaflet aortic valve with no regurgitation. Mild aortic valve leaflet calcification. Mildly restricted aortic valve leaflets. Mild aortic valve stenosis. Aortic valve peak pressure gradient of 26 and mean gradient of  16.5 mmHg, calculated aortic valve area 1.3 cm?Marland Kitchen ?IVC is dilated with respiratory variation. This may suggest elevated right heart pressure ? ?Carotid artery duplex 06/09/2021 ?Duplex suggests stenosis in the right internal carotid artery (50-69%). ?Duplex suggests stenosis in the left internal carotid artery  (1-15%). ?Duplex suggests stenosis in the right common carotid artery (<50%). ?Both vertebral artery flow were not visualized.  ?Follow up in six months is appropriate if clinically indicated. ?Compared to

## 2021-12-14 ENCOUNTER — Ambulatory Visit: Payer: Medicare PPO

## 2021-12-14 DIAGNOSIS — I6521 Occlusion and stenosis of right carotid artery: Secondary | ICD-10-CM

## 2021-12-16 NOTE — Progress Notes (Unsigned)
ICD-10-CM   ?1. Asymptomatic stenosis of right carotid artery  I65.21 PCV CAROTID DUPLEX (BILATERAL)  ?  PCV CAROTID DUPLEX (BILATERAL)  ?  ? ?

## 2021-12-26 NOTE — Progress Notes (Signed)
Stable carotid duplex, I will recheck in 6 months and I will see you back in 1 year as planned unless you need to see me sooner

## 2021-12-27 ENCOUNTER — Other Ambulatory Visit: Payer: Self-pay | Admitting: Cardiology

## 2021-12-27 NOTE — Progress Notes (Signed)
Called pt to inform him about his duplex. Pt understood and made an appt for his repeat of his duplex in 6 month

## 2022-01-25 ENCOUNTER — Encounter: Payer: Self-pay | Admitting: Physician Assistant

## 2022-01-25 ENCOUNTER — Ambulatory Visit (INDEPENDENT_AMBULATORY_CARE_PROVIDER_SITE_OTHER): Payer: Medicare PPO | Admitting: Physician Assistant

## 2022-01-25 ENCOUNTER — Other Ambulatory Visit: Payer: Self-pay | Admitting: Physician Assistant

## 2022-01-25 ENCOUNTER — Ambulatory Visit
Admission: RE | Admit: 2022-01-25 | Discharge: 2022-01-25 | Disposition: A | Discharge status: Home / Self Care | Payer: Medicare PPO | Source: Ambulatory Visit | Attending: Physician Assistant | Admitting: Physician Assistant

## 2022-01-25 VITALS — BP 99/62 | HR 85 | Ht 69.0 in | Wt 213.5 lb

## 2022-01-25 DIAGNOSIS — M79662 Pain in left lower leg: Secondary | ICD-10-CM

## 2022-01-25 DIAGNOSIS — M7989 Other specified soft tissue disorders: Secondary | ICD-10-CM

## 2022-01-25 MED ORDER — DOXYCYCLINE HYCLATE 100 MG PO TABS: 100.0000 mg | ORAL_TABLET | Freq: Two times a day (BID) | ORAL | 0 refills | Status: DC

## 2022-01-25 NOTE — Progress Notes (Signed)
  Established patient acute visit   Patient: Christopher Rasmussen   DOB: Aug 05, 1946   76 y.o. Male  MRN: [id number] Visit Date: 01/25/2022  Chief Complaint  Patient presents with   Acute Visit    Left leg pain/swelling x 1 week . Taking Advil helps, been using ice that seems to help.     Subjective    HPI HPI     Acute Visit    Additional comments: Left leg pain/swelling x 1 week . Taking Advil helps, been using ice that seems to help.        Last edited by Carnella Guadalajara, CMA on 01/25/2022  8:31 AM.      Patient presents for c/o left lower leg pain and swelling x 1 week. Also reports redness. No recent travel or surgeries. No fever, chest pain, palpitations, night sweats or chills. States took Ibuprofen 400 mg twice daily for 3 days and most recently has been taking 400 mg once daily. Medication has provided some relief.        Medications: Outpatient Medications Prior to Visit  Medication Sig   amLODipine (NORVASC) 10 MG tablet Take 10 mg by mouth every morning.    aspirin EC 81 MG tablet Take 81 mg by mouth daily.   cloNIDine (CATAPRES - DOSED IN MG/24 HR) 0.2 mg/24hr patch PLACE 1 PATCH ONTO THE SKIN ONCE A WEEK.   labetalol (NORMODYNE) 200 MG tablet TAKE 1 TABLET BY MOUTH 2 TIMES DAILY. STOP TAKING METOPROLOL SUCCINATE.   metFORMIN (GLUCOPHAGE) 1000 MG tablet Take 1,000 mg by mouth 2 (two) times daily with a meal.   OZEMPIC, 1 MG/DOSE, 2 MG/1.5ML SOPN 1 Units once a week.   simvastatin (ZOCOR) 10 MG tablet Take 10 mg by mouth at bedtime.   valsartan-hydrochlorothiazide (DIOVAN-HCT) 320-12.5 MG tablet TAKE 1 TABLET BY MOUTH EVERY MORNING   Vitamin D, Ergocalciferol, (DRISDOL) 1.25 MG (50000 UNIT) CAPS capsule Take 50,000 Units by mouth once a week.   No facility-administered medications prior to visit.    Review of Systems Review of Systems:  A fourteen system review of systems was performed and found to be positive as per HPI.     Objective    BP 99/62   Pulse 85    Ht 5\' 9"  (1.753 m)   Wt 213 lb 8 oz (96.8 kg)   SpO2 97%   BMI 31.53 kg/m    Physical Exam  General:  Well Developed, well nourished, appropriate for stated age.  Neuro:  Alert and oriented,  extra-ocular muscles intact  HEENT:  Normocephalic, atraumatic, neck supple  Skin:  no gross rash, warm, pink. Cardiac:  RRR Respiratory: CTA B/L  Vascular:  Warmth and swelling of left LE present with some calve tenderness more laterally Psych:  No HI/SI, judgement and insight good, Euthymic mood. Full Affect.   No results found for any visits on 01/25/22.  Assessment & Plan     Patient presenting with s/sx concerning for DVT, will place stat doppler ultrasound order. Discussed with patient if vascular 01/27/22 negative, then will start antibiotic therapy for cellulitis. Patient is scheduled for vascular ultrasound today at 2:40 pm at [facility].    Return if symptoms worsen or fail to improve/pending [facility].        [place], PA-C  [facility] [phone] (phone) [phone] (fax)  [facility]

## 2022-01-25 NOTE — Progress Notes (Signed)
Rx sent for doxycyline 100 mg BID x 7 days for cellulitis. MA, PA-C

## 2022-01-25 NOTE — Patient Instructions (Signed)
Edema ? ?Edema is when you have too much fluid in your body or under your skin. Edema may make your legs, feet, and ankles swell. Swelling often happens in looser tissues, such as around your eyes. This is a common condition. It gets more common as you get older. ?There are many possible causes of edema. These include: ?Eating too much salt (sodium). ?Being on your feet or sitting for a long time. ?Certain medical conditions, such as: ?Pregnancy. ?Heart failure. ?Liver disease. ?Kidney disease. ?Cancer. ?Hot weather may make edema worse. Edema is usually painless. Your skin may look swollen or shiny. ?Follow these instructions at home: ?Medicines ?Take over-the-counter and prescription medicines only as told by your doctor. ?Your doctor may prescribe a medicine to help your body get rid of extra water (diuretic). Take this medicine if you are told to take it. ?Eating and drinking ?Eat a low-salt (low-sodium) diet as told by your doctor. Sometimes, eating less salt may reduce swelling. ?Depending on the cause of your swelling, you may need to limit how much fluid you drink (fluid restriction). ?General instructions ?Raise the injured area above the level of your heart while you are sitting or lying down. ?Do not sit still or stand for a long time. ?Do not wear tight clothes. Do not wear garters on your upper legs. ?Exercise your legs. This can help the swelling go down. ?Wear compression stockings as told by your doctor. It is important that these are the right size. These should be prescribed by your doctor to prevent possible injuries. ?If elastic bandages or wraps are recommended, use them as told by your doctor. ?Contact a doctor if: ?Treatment is not working. ?You have heart, liver, or kidney disease and have symptoms of edema. ?You have sudden and unexplained weight gain. ?Get help right away if: ?You have shortness of breath or chest pain. ?You cannot breathe when you lie down. ?You have pain, redness, or  warmth in the swollen areas. ?You have heart, liver, or kidney disease and get edema all of a sudden. ?You have a fever and your symptoms get worse all of a sudden. ?These symptoms may be an emergency. Get help right away. Call 911. ?Do not wait to see if the symptoms will go away. ?Do not drive yourself to the hospital. ?Summary ?Edema is when you have too much fluid in your body or under your skin. ?Edema may make your legs, feet, and ankles swell. Swelling often happens in looser tissues, such as around your eyes. ?Raise the injured area above the level of your heart while you are sitting or lying down. ?Follow your doctor's instructions about diet and how much fluid you can drink. ?This information is not intended to replace advice given to you by your health care provider. Make sure you discuss any questions you have with your health care provider. ?Document Revised: 03/29/2021 Document Reviewed: 03/29/2021 ?Elsevier Patient Education ? 2023 [employer] ? ?

## 2022-02-04 ENCOUNTER — Other Ambulatory Visit: Payer: Self-pay | Admitting: Cardiology

## 2022-02-04 DIAGNOSIS — I10 Essential (primary) hypertension: Secondary | ICD-10-CM

## 2022-02-28 ENCOUNTER — Other Ambulatory Visit: Payer: Self-pay

## 2022-02-28 DIAGNOSIS — E1165 Type 2 diabetes mellitus with hyperglycemia: Secondary | ICD-10-CM

## 2022-02-28 DIAGNOSIS — E782 Mixed hyperlipidemia: Secondary | ICD-10-CM

## 2022-02-28 MED ORDER — SIMVASTATIN 10 MG PO TABS: 10.0000 mg | ORAL_TABLET | Freq: Every day | ORAL | 0 refills | Status: DC

## 2022-02-28 MED ORDER — OZEMPIC (1 MG/DOSE) 2 MG/1.5ML ~~LOC~~ SOPN: 1.0000 mg | PEN_INJECTOR | SUBCUTANEOUS | 0 refills | Status: DC

## 2022-03-02 ENCOUNTER — Telehealth: Payer: Self-pay | Admitting: Physician Assistant

## 2022-03-02 ENCOUNTER — Other Ambulatory Visit: Payer: Self-pay

## 2022-03-02 DIAGNOSIS — E1165 Type 2 diabetes mellitus with hyperglycemia: Secondary | ICD-10-CM

## 2022-03-02 MED ORDER — OZEMPIC (1 MG/DOSE) 2 MG/1.5ML ~~LOC~~ SOPN: 1.0000 mg | PEN_INJECTOR | SUBCUTANEOUS | 0 refills | Status: DC

## 2022-03-02 NOTE — Telephone Encounter (Signed)
Rx sent to [facility]

## 2022-03-02 NOTE — Telephone Encounter (Signed)
Patient came in person and stated he wanted his Ozempic medication sent to [facility] instead of the [employer]. [facility]  [facility] - [place], [place] - [address] Phone:  [phone]  Fax:  [phone]

## 2022-03-30 ENCOUNTER — Ambulatory Visit (INDEPENDENT_AMBULATORY_CARE_PROVIDER_SITE_OTHER): Payer: Medicare PPO | Admitting: Physician Assistant

## 2022-03-30 ENCOUNTER — Encounter: Payer: Self-pay | Admitting: Physician Assistant

## 2022-03-30 VITALS — BP 118/57 | HR 84 | Temp 97.7°F | Ht 69.0 in | Wt 213.0 lb

## 2022-03-30 DIAGNOSIS — E782 Mixed hyperlipidemia: Secondary | ICD-10-CM | POA: Diagnosis not present

## 2022-03-30 DIAGNOSIS — Z0000 Encounter for general adult medical examination without abnormal findings: Secondary | ICD-10-CM

## 2022-03-30 DIAGNOSIS — I10 Essential (primary) hypertension: Secondary | ICD-10-CM

## 2022-03-30 DIAGNOSIS — E1165 Type 2 diabetes mellitus with hyperglycemia: Secondary | ICD-10-CM | POA: Diagnosis not present

## 2022-03-30 DIAGNOSIS — Z1321 Encounter for screening for nutritional disorder: Secondary | ICD-10-CM

## 2022-03-30 LAB — POCT GLYCOSYLATED HEMOGLOBIN (HGB A1C): Hemoglobin A1C: 6.2 % — AB (ref 4.0–5.6)

## 2022-03-30 NOTE — Progress Notes (Signed)
Complete physical exam   Patient: Christopher Rasmussen   DOB: 19-May-1946   76 y.o. Male  MRN: [id number] Visit Date: 03/30/2022   Chief Complaint  Patient presents with   Annual Exam   Subjective    Christopher Rasmussen is a 76 y.o. male who presents today for a complete physical exam.  He reports consuming a general diet.  Tries to stay as active as possible.  He generally feels fairly well. He does not have additional problems to discuss today.     Past Medical History:  Diagnosis Date   Abdominal aortic aneurysm (AAA) ([place])    Abdominal aortic aneurysm (AAA) 30 to 34 mm in diameter (HCC) 10/04/2018   Aortic stenosis    Arthritis    Asymptomatic stenosis of right carotid artery 10/04/2018   Carotid stenosis    Heart block    High cholesterol    under control   Hypertension    Type II diabetes mellitus ([place])    Past Surgical History:  Procedure Laterality Date   CATARACT EXTRACTION     KNEE ARTHROSCOPY Left 1990's   KNEE CLOSED REDUCTION Left 10/17/2014   Procedure: CLOSED MANIPULATION LEFT KNEE UNDER ANESTHESIA;  Surgeon: Mcarthur Rossetti, MD;  Location: [facility];  Service: Orthopedics;  Laterality: Left;   KNEE CLOSED REDUCTION Left 12/19/2014   Procedure: CLOSED MANIPULATION  LEFT KNEE;  Surgeon: Mcarthur Rossetti, MD;  Location: [facility];  Service: Orthopedics;  Laterality: Left;   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   Procedure: LEFT TOTAL KNEE ARTHROPLASTY;  Surgeon: Mcarthur Rossetti, MD;  Location: [facility];  Service: Orthopedics;  Laterality: Left;   TOTAL KNEE ARTHROPLASTY Right 12/19/2014   Procedure: RIGHT TOTAL KNEE ARTHROPLASTY;  Surgeon: Mcarthur Rossetti, MD;  Location: [facility];  Service: Orthopedics;  Laterality: Right;   Social History   Socioeconomic History   Marital status: Married    Spouse name: Cy Bresee   Number of children: 2   Years of education: Not on file   Highest education level: Not on file  Occupational History   Not on file   Tobacco Use   Smoking status: Never   Smokeless tobacco: Never  Vaping Use   Vaping Use: Never used  Substance and Sexual Activity   Alcohol use: No   Drug use: No   Sexual activity: Not Currently    Birth control/protection: None  Other Topics Concern   Not on file  Social History Narrative   Not on file   Social Determinants of Health   Financial Resource Strain: Not on file  Food Insecurity: Not on file  Transportation Needs: Not on file  Physical Activity: Not on file  Stress: Not on file  Social Connections: Not on file  Intimate Partner Violence: Not on file     Medications: Outpatient Medications Prior to Visit  Medication Sig   amLODipine (NORVASC) 10 MG tablet Take 10 mg by mouth every morning.    aspirin EC 81 MG tablet Take 81 mg by mouth daily.   cloNIDine (CATAPRES - DOSED IN MG/24 HR) 0.2 mg/24hr patch PLACE 1 PATCH ONTO THE SKIN ONCE A WEEK.   doxycycline (VIBRA-TABS) 100 MG tablet Take 1 tablet (100 mg total) by mouth 2 (two) times daily.   labetalol (NORMODYNE) 200 MG tablet TAKE 1 TABLET BY MOUTH 2 TIMES DAILY. STOP TAKING METOPROLOL SUCCINATE.   metFORMIN (GLUCOPHAGE) 1000 MG tablet Take 1,000 mg by mouth 2 (two) times daily with a  meal.   OZEMPIC, 1 MG/DOSE, 2 MG/1.5ML SOPN Inject 1 mg as directed once a week.   simvastatin (ZOCOR) 10 MG tablet Take 1 tablet (10 mg total) by mouth at bedtime.   valsartan-hydrochlorothiazide (DIOVAN-HCT) 320-12.5 MG tablet TAKE 1 TABLET BY MOUTH EVERY MORNING   Vitamin D, Ergocalciferol, (DRISDOL) 1.25 MG (50000 UNIT) CAPS capsule Take 50,000 Units by mouth once a week.   No facility-administered medications prior to visit.    Review of Systems Review of Systems:  A fourteen system review of systems was performed and found to be positive as per HPI.   Last CBC Lab Results  Component Value Date   WBC 6.1 11/26/2021   HGB 15.8 11/26/2021   HCT 45.0 11/26/2021   MCV 87 11/26/2021   MCH 30.4 11/26/2021   RDW  13.6 11/26/2021   PLT 188 28/76/8115   Last metabolic panel Lab Results  Component Value Date   GLUCOSE 173 (H) 11/26/2021   NA 142 11/26/2021   K 4.3 11/26/2021   CL 103 11/26/2021   CO2 23 11/26/2021   BUN 16 11/26/2021   CREATININE 0.82 11/26/2021   EGFR 92 11/26/2021   CALCIUM 9.7 11/26/2021   PHOS 4.0 08/24/2014   PROT 6.9 11/26/2021   ALBUMIN 4.7 11/26/2021   LABGLOB 2.2 11/26/2021   AGRATIO 2.1 11/26/2021   BILITOT 0.9 11/26/2021   ALKPHOS 73 11/26/2021   AST 18 11/26/2021   ALT 22 11/26/2021   ANIONGAP 12 12/22/2014   Last lipids Lab Results  Component Value Date   CHOL 132 11/26/2021   HDL 35 (L) 11/26/2021   LDLCALC 76 11/26/2021   TRIG 117 11/26/2021   CHOLHDL 3.8 11/26/2021   Last hemoglobin A1c Lab Results  Component Value Date   HGBA1C 6.2 (A) 03/30/2022   Last thyroid functions Lab Results  Component Value Date   TSH 1.420 11/26/2021   Last vitamin D Lab Results  Component Value Date   VD25OH 60.2 11/26/2021    Objective     BP (!) 118/57   Pulse 84   Temp 97.7 F (36.5 C)   Ht 5' 9"  (1.753 m)   Wt 213 lb (96.6 kg)   SpO2 98%   BMI 31.45 kg/m  BP Readings from Last 3 Encounters:  03/30/22 (!) 118/57  01/25/22 99/62  12/08/21 119/69   Wt Readings from Last 3 Encounters:  03/30/22 213 lb (96.6 kg)  01/25/22 213 lb 8 oz (96.8 kg)  12/08/21 221 lb 9.6 oz (100.5 kg)    Physical Exam   General Appearance:    Alert, cooperative, in no acute distress, appears stated age  Head:    Normocephalic, without obvious abnormality, atraumatic  Eyes:    PERRL, conjunctiva/corneas clear, EOM's intact, fundi    benign, both eyes       Ears:    Normal TM's and external ear canals, both ears  Nose:   Nares normal, septum midline, mucosa normal, no drainage   or sinus tenderness  Throat:   Lips, mucosa, and tongue normal; teeth and gums normal  Neck:   Supple, symmetrical, trachea midline, no adenopathy;       thyroid:  No  enlargement/tenderness/nodules; no JVD  Back:     Symmetric, no curvature, ROM normal, no CVA tenderness  Lungs:     Clear to auscultation bilaterally, respirations unlabored  Chest wall:    No tenderness or deformity  Heart:    Normal heart rate. Normal rhythm. No murmurs, rubs,  or gallops.  S1 and S2 normal  Abdomen:     Soft, non-tender, bowel sounds active all four quadrants,    no masses, no organomegaly  Genitalia:    deferred  Rectal:    deferred  Extremities:   All extremities are intact. No cyanosis or edema  Pulses:   2+ and symmetric all extremities  Skin:   Skin color, texture, turgor normal, no rashes or lesions  Lymph nodes:   Cervical and supraclavicular nodes normal  Neurologic:   CNII-XII grossly intact.      Last depression screening scores    03/30/2022   10:10 AM 01/25/2022    8:29 AM 11/26/2021   10:00 AM  PHQ 2/9 Scores  PHQ - 2 Score 0 0 0  PHQ- 9 Score 0 0 0   Last fall risk screening    03/30/2022   10:10 AM  [place] in the past year? 0  Number falls in past yr: 0  Injury with Fall? 0  Risk for fall due to : No Fall Risks  Follow up Falls evaluation completed     Results for orders placed or performed in visit on 03/30/22  POCT glycosylated hemoglobin (Hb A1C)  Result Value Ref Range   Hemoglobin A1C 6.2 (A) 4.0 - 5.6 %   HbA1c POC (<> result, manual entry)     HbA1c, POC (prediabetic range)     HbA1c, POC (controlled diabetic range)      Assessment & Plan    Routine Health Maintenance and Physical Exam  Exercise Activities and Dietary recommendations -A heart healthy diet low in fat and carbohydrates.   Immunization History  Administered Date(s) Administered   Influenza, High Dose Seasonal PF 07/09/2019   PFIZER(Purple Top)SARS-COV-2 Vaccination 10/01/2019, 10/22/2019, 07/07/2020, 01/20/2021   Pfizer Covid-19 Vaccine Bivalent Booster 29yr & up 10/04/2021    Health Maintenance  Topic Date Due   OPHTHALMOLOGY EXAM  Never  done   Hepatitis C Screening  Never done   INFLUENZA VACCINE  03/08/2022   COVID-19 Vaccine (6 - Pfizer series) 04/15/2022 (Originally 02/01/2022)   Zoster Vaccines- Shingrix (1 of 2) 06/30/2022 (Originally 07/29/1997)   Pneumonia Vaccine 75 Years old (1 - PCV) 03/31/2023 (Originally 07/29/2012)   COLONOSCOPY (Pts 45-439yrInsurance coverage will need to be confirmed)  03/31/2023 (Originally 07/29/1992)   TETANUS/TDAP  03/31/2023 (Originally 07/29/1966)   HEMOGLOBIN A1C  09/30/2022   FOOT EXAM  03/31/2023   HPV VACCINES  Aged Out    Discussed health benefits of physical activity, and encouraged him to engage in regular exercise appropriate for his age and condition.  Problem List Items Addressed This Visit       Cardiovascular and Mediastinum   Essential hypertension     Endocrine   Diabetes [place]   Relevant Orders   POCT glycosylated hemoglobin (Hb A1C) (Completed)   CBC   Comprehensive metabolic panel     Other   HLD (hyperlipidemia)   Relevant Orders   Comprehensive metabolic panel   Lipid panel   Other Visit Diagnoses     Healthcare maintenance       Relevant Orders   CBC   Comprehensive metabolic panel   TSH   Lipid panel   Encounter for vitamin deficiency screening       Relevant Orders   Vitamin D (25 hydroxy)      Will obtain routine fasting labs. Pt deferred immunizations and colonoscopy.  A1c much improved and at goal  at 6.2. Continue current medication regimen. Discussed good hydration.  Return in about 3 months (around 06/30/2022) for DM, HTN, HLD.       Lorrene Reid, PA-C  [facility] at [facility] [phone] (phone) [phone] (fax)  [place]

## 2022-03-30 NOTE — Patient Instructions (Signed)
Preventive Care 65 Years and Older, Male Preventive care refers to lifestyle choices and visits with your health care provider that can promote health and wellness. Preventive care visits are also called wellness exams. What can I expect for my preventive care visit? Counseling During your preventive care visit, your health care provider may ask about your: Medical history, including: Past medical problems. Family medical history. History of falls. Current health, including: Emotional well-being. Home life and relationship well-being. Sexual activity. Memory and ability to understand (cognition). Lifestyle, including: Alcohol, nicotine or tobacco, and drug use. Access to firearms. Diet, exercise, and sleep habits. Work and work environment. Sunscreen use. Safety issues such as seatbelt and bike helmet use. Physical exam Your health care provider will check your: Height and weight. These may be used to calculate your BMI (body mass index). BMI is a measurement that tells if you are at a healthy weight. Waist circumference. This measures the distance around your waistline. This measurement also tells if you are at a healthy weight and may help predict your risk of certain diseases, such as type 2 diabetes and high blood pressure. Heart rate and blood pressure. Body temperature. Skin for abnormal spots. What immunizations do I need?  Vaccines are usually given at various ages, according to a schedule. Your health care provider will recommend vaccines for you based on your age, medical history, and lifestyle or other factors, such as travel or where you work. What tests do I need? Screening Your health care provider may recommend screening tests for certain conditions. This may include: Lipid and cholesterol levels. Diabetes screening. This is done by checking your blood sugar (glucose) after you have not eaten for a while (fasting). Hepatitis C test. Hepatitis B test. HIV (human  immunodeficiency virus) test. STI (sexually transmitted infection) testing, if you are at risk. Lung cancer screening. Colorectal cancer screening. Prostate cancer screening. Abdominal aortic aneurysm (AAA) screening. You may need this if you are a current or former smoker. Talk with your health care provider about your test results, treatment options, and if necessary, the need for more tests. Follow these instructions at home: Eating and drinking  Eat a diet that includes fresh fruits and vegetables, whole grains, lean protein, and low-fat dairy products. Limit your intake of foods with high amounts of sugar, saturated fats, and salt. Take vitamin and mineral supplements as recommended by your health care provider. Do not drink alcohol if your health care provider tells you not to drink. If you drink alcohol: Limit how much you have to 0-2 drinks a day. Know how much alcohol is in your drink. In the [place] one drink equals one 12 oz bottle of beer (355 mL), one 5 oz glass of wine (148 mL), or one 1 oz glass of hard liquor (44 mL). Lifestyle Brush your teeth every morning and night with fluoride toothpaste. Floss one time each day. Exercise for at least 30 minutes 5 or more days each week. Do not use any products that contain nicotine or tobacco. These products include cigarettes, chewing tobacco, and vaping devices, such as e-cigarettes. If you need help quitting, ask your health care provider. Do not use drugs. If you are sexually active, practice safe sex. Use a condom or other form of protection to prevent STIs. Take aspirin only as told by your health care provider. Make sure that you understand how much to take and what form to take. Work with your health care provider to find out whether it is safe   and beneficial for you to take aspirin daily. Ask your health care provider if you need to take a cholesterol-lowering medicine (statin). Find healthy ways to manage stress, such  as: Meditation, yoga, or listening to music. Journaling. Talking to a trusted person. Spending time with friends and family. Safety Always wear your seat belt while driving or riding in a vehicle. Do not drive: If you have been drinking alcohol. Do not ride with someone who has been drinking. When you are tired or distracted. While texting. If you have been using any mind-altering substances or drugs. Wear a helmet and other protective equipment during sports activities. If you have firearms in your house, make sure you follow all gun safety procedures. Minimize exposure to UV radiation to reduce your risk of skin cancer. What's next? Visit your health care provider once a year for an annual wellness visit. Ask your health care provider how often you should have your eyes and teeth checked. Stay up to date on all vaccines. This information is not intended to replace advice given to you by your health care provider. Make sure you discuss any questions you have with your health care provider. Document Revised: 01/20/2021 Document Reviewed: 01/20/2021 Elsevier Patient Education  2023 Elsevier Inc.  

## 2022-03-31 LAB — LIPID PANEL
Chol/HDL Ratio: 3.9 ratio (ref 0.0–5.0)
Cholesterol, Total: 129 mg/dL (ref 100–199)
HDL: 33 mg/dL — ABNORMAL LOW (ref >39)
LDL Chol Calc (NIH): 75 mg/dL (ref 0–99)
Triglycerides: 113 mg/dL (ref 0–149)
VLDL Cholesterol Cal: 21 mg/dL (ref 5–40)

## 2022-03-31 LAB — CBC
Hematocrit: 44.9 % (ref 37.5–51.0)
Hemoglobin: 15.5 g/dL (ref 13.0–17.7)
MCH: 30.6 pg (ref 26.6–33.0)
MCHC: 34.5 g/dL (ref 31.5–35.7)
MCV: 89 fL (ref 79–97)
Platelets: 203 10*3/uL (ref 150–450)
RBC: 5.07 x10E6/uL (ref 4.14–5.80)
RDW: 13.7 % (ref 11.6–15.4)
WBC: 6.3 10*3/uL (ref 3.4–10.8)

## 2022-03-31 LAB — COMPREHENSIVE METABOLIC PANEL
ALT: 17 IU/L (ref 0–44)
AST: 18 IU/L (ref 0–40)
Albumin/Globulin Ratio: 1.8 (ref 1.2–2.2)
Albumin: 4.6 g/dL (ref 3.8–4.8)
Alkaline Phosphatase: 80 IU/L (ref 44–121)
BUN/Creatinine Ratio: 18 (ref 10–24)
BUN: 15 mg/dL (ref 8–27)
Bilirubin Total: 1.1 mg/dL (ref 0.0–1.2)
CO2: 21 mmol/L (ref 20–29)
Calcium: 9.9 mg/dL (ref 8.6–10.2)
Chloride: 102 mmol/L (ref 96–106)
Creatinine, Ser: 0.84 mg/dL (ref 0.76–1.27)
Globulin, Total: 2.5 g/dL (ref 1.5–4.5)
Glucose: 148 mg/dL — ABNORMAL HIGH (ref 70–99)
Potassium: 4.3 mmol/L (ref 3.5–5.2)
Sodium: 139 mmol/L (ref 134–144)
Total Protein: 7.1 g/dL (ref 6.0–8.5)
eGFR: 92 mL/min/{1.73_m2} (ref >59)

## 2022-03-31 LAB — VITAMIN D 25 HYDROXY (VIT D DEFICIENCY, FRACTURES): Vit D, 25-Hydroxy: 60.9 ng/mL (ref 30.0–100.0)

## 2022-03-31 LAB — TSH: TSH: 1.36 u[IU]/mL (ref 0.450–4.500)

## 2022-05-03 ENCOUNTER — Other Ambulatory Visit: Payer: Self-pay | Admitting: Physician Assistant

## 2022-05-16 ENCOUNTER — Other Ambulatory Visit: Payer: Self-pay | Admitting: Physician Assistant

## 2022-05-27 ENCOUNTER — Other Ambulatory Visit: Payer: Self-pay | Admitting: Physician Assistant

## 2022-05-27 DIAGNOSIS — E782 Mixed hyperlipidemia: Secondary | ICD-10-CM

## 2022-06-22 ENCOUNTER — Ambulatory Visit: Payer: Medicare PPO | Admitting: Physician Assistant

## 2022-06-22 ENCOUNTER — Encounter: Payer: Self-pay | Admitting: Physician Assistant

## 2022-06-22 VITALS — BP 112/64 | HR 73 | Resp 18 | Ht 69.0 in | Wt 214.0 lb

## 2022-06-22 DIAGNOSIS — E1165 Type 2 diabetes mellitus with hyperglycemia: Secondary | ICD-10-CM | POA: Diagnosis not present

## 2022-06-22 DIAGNOSIS — E782 Mixed hyperlipidemia: Secondary | ICD-10-CM | POA: Diagnosis not present

## 2022-06-22 DIAGNOSIS — I10 Essential (primary) hypertension: Secondary | ICD-10-CM | POA: Diagnosis not present

## 2022-06-22 LAB — POCT GLYCOSYLATED HEMOGLOBIN (HGB A1C): HbA1c POC (<> result, manual entry): 7.2 % (ref 4.0–5.6)

## 2022-06-22 NOTE — Assessment & Plan Note (Signed)
-  Last lipid panel: HDL 33, LDL 75 (goal <70), last liver enzymes normal.  -Continue simvastatin 10 mg. Recommend repeating lipid panel at follow-up visit, if LDL remains elevated then recommend increasing simvastatin to 20 mg or change to higher intensity statin. Recommend to follow a heart healthy diet low in fat and carbohydrates. Continue to stay as active as possible.

## 2022-06-22 NOTE — Assessment & Plan Note (Addendum)
-  Controlled. Continue amlodipine 10 mg daily, valsartan-HCTZ 320-12.5 mg and labetalol 200 mg twice daily. Last CMP, renal function and electrolytes normal. Recommend repeating CMP at follow-up visit.

## 2022-06-22 NOTE — Progress Notes (Signed)
Established patient visit   Patient: Christopher Rasmussen   DOB: 1945/12/12   76 y.o. Male  MRN: [id number] Visit Date: 06/22/2022  Chief Complaint  Patient presents with   Follow-up    fasting   Diabetes   Subjective    HPI HPI     Follow-up    Additional comments: fasting      Last edited by Gemma Payor, CMA on 06/22/2022 10:11 AM.      Patient presents for chronic follow-up visit.  Diabetes: Pt denies increased urination or thirst. Pt reports medication compliance. No hypoglycemic events. Checking glucose at home. FBS range from 150-170s. Denies changes with diet or increased intake with sugars/carbs.   HTN: Pt denies chest pain, palpitations, dizziness or lower extremity swelling. Taking medication as directed without side effects. Pt follows a low salt diet.  HLD: Pt taking medication as directed without issues. No muscle weakness or myalgias.   Medications: Outpatient Medications Prior to Visit  Medication Sig   amLODipine (NORVASC) 10 MG tablet TAKE 1 TABLET BY MOUTH DAILY   aspirin EC 81 MG tablet Take 81 mg by mouth daily.   cloNIDine (CATAPRES - DOSED IN MG/24 HR) 0.2 mg/24hr patch PLACE 1 PATCH ONTO THE SKIN ONCE A WEEK.   labetalol (NORMODYNE) 200 MG tablet TAKE 1 TABLET BY MOUTH 2 TIMES DAILY. STOP TAKING METOPROLOL SUCCINATE.   metFORMIN (GLUCOPHAGE) 1000 MG tablet TAKE 1 TABLET BY MOUTH TWICE A DAY   OZEMPIC, 1 MG/DOSE, 2 MG/1.5ML SOPN Inject 1 mg as directed once a week.   simvastatin (ZOCOR) 10 MG tablet TAKE 1 TABLET (10 MG TOTAL) BY MOUTH AT BEDTIME.   valsartan-hydrochlorothiazide (DIOVAN-HCT) 320-12.5 MG tablet TAKE 1 TABLET BY MOUTH EVERY MORNING   Vitamin D, Ergocalciferol, (DRISDOL) 1.25 MG (50000 UNIT) CAPS capsule Take 50,000 Units by mouth once a week.   [DISCONTINUED] doxycycline (VIBRA-TABS) 100 MG tablet Take 1 tablet (100 mg total) by mouth 2 (two) times daily.   No facility-administered medications prior to visit.    Review of  Systems Review of Systems:  A fourteen system review of systems was performed and found to be positive as per HPI.  Last CBC Lab Results  Component Value Date   WBC 6.3 03/30/2022   HGB 15.5 03/30/2022   HCT 44.9 03/30/2022   MCV 89 03/30/2022   MCH 30.6 03/30/2022   RDW 13.7 03/30/2022   PLT 203 76/14/7092   Last metabolic panel Lab Results  Component Value Date   GLUCOSE 148 (H) 03/30/2022   NA 139 03/30/2022   K 4.3 03/30/2022   CL 102 03/30/2022   CO2 21 03/30/2022   BUN 15 03/30/2022   CREATININE 0.84 03/30/2022   EGFR 92 03/30/2022   CALCIUM 9.9 03/30/2022   PHOS 4.0 08/24/2014   PROT 7.1 03/30/2022   ALBUMIN 4.6 03/30/2022   LABGLOB 2.5 03/30/2022   AGRATIO 1.8 03/30/2022   BILITOT 1.1 03/30/2022   ALKPHOS 80 03/30/2022   AST 18 03/30/2022   ALT 17 03/30/2022   ANIONGAP 12 12/22/2014   Last lipids Lab Results  Component Value Date   CHOL 129 03/30/2022   HDL 33 (L) 03/30/2022   LDLCALC 75 03/30/2022   TRIG 113 03/30/2022   CHOLHDL 3.9 03/30/2022   Last hemoglobin A1c Lab Results  Component Value Date   HGBA1C 7.2 06/22/2022   Last thyroid functions Lab Results  Component Value Date   TSH 1.360 03/30/2022   Last vitamin D Lab Results  Component Value Date   VD25OH 60.9 03/30/2022     Objective    BP 112/64 (BP Location: Left Arm, Patient Position: Sitting, Cuff Size: Normal)   Pulse 73   Resp 18   Ht _0  (1.753 m)   Wt 214 lb (97.1 kg)   SpO2 96%   BMI 31.60 kg/m  BP Readings from Last 3 Encounters:  06/22/22 112/64  03/30/22 (!) 118/57  01/25/22 99/62   Wt Readings from Last 3 Encounters:  06/22/22 214 lb (97.1 kg)  03/30/22 213 lb (96.6 kg)  01/25/22 213 lb 8 oz (96.8 kg)    Physical Exam  General:  Well Developed, well nourished, appropriate for stated age.  Neuro:  Alert and oriented,  extra-ocular muscles intact  HEENT:  Normocephalic, atraumatic, neck supple  Skin:  no gross rash, warm, pink. Cardiac:  RRR, S1 S2,  +systolic murmur  Respiratory: CTA B/L  Vascular:  Ext warm, no cyanosis apprec.; cap RF less 2 sec. Psych:  No HI/SI, judgement and insight good, Euthymic mood. Full Affect.   Results for orders placed or performed in visit on 06/22/22  POCT HgB A1C  Result Value Ref Range   Hemoglobin A1C     HbA1c POC (<> result, manual entry) 7.2 4.0 - 5.6 %   HbA1c, POC (prediabetic range)     HbA1c, POC (controlled diabetic range)      Assessment & Plan      Problem List Items Addressed This Visit       Cardiovascular and Mediastinum   Essential hypertension    -Controlled. Continue amlodipine 10 mg daily, valsartan-HCTZ 320-12.5 mg and labetalol 200 mg twice daily. Last CMP, renal function and electrolytes normal. Recommend repeating CMP at follow-up visit.         Endocrine   Diabetes ([place]) - Primary    -A1c has increased from 6.2 to 7.2, discussed with patient A1c goal given age and existing co-morbidities is less than 7.5, so will continue with Metformin 1000 mg BID and Ozempic 1 mg once a week. If A1c fails to consistently remain at goal then recommend treatment adjustments. Patient unable to provide urine sample for microalbumin, recommend obtaining at follow-up visit. Recommend to continue with monitoring fasting glucose and follow a diabetic diet.       Relevant Orders   POCT HgB A1C (Completed)     Other   HLD (hyperlipidemia)    -Last lipid panel: HDL 33, LDL 75 (goal <70), last liver enzymes normal.  -Continue simvastatin 10 mg. Recommend repeating lipid panel at follow-up visit, if LDL remains elevated then recommend increasing simvastatin to 20 mg or change to higher intensity statin. Recommend to follow a heart healthy diet low in fat and carbohydrates. Continue to stay as active as possible.        Return in about 4 months (around 10/21/2022) for DM, HTN, HLD and FBW (lipid panel, cmp, a1c) and urine micro.        Lorrene Reid, PA-C  [facility] at  [facility] [phone] (phone) [phone] (fax)  [place]

## 2022-06-22 NOTE — Assessment & Plan Note (Signed)
-  A1c has increased from 6.2 to 7.2, discussed with patient A1c goal given age and existing co-morbidities is less than 7.5, so will continue with Metformin 1000 mg BID and Ozempic 1 mg once a week. If A1c fails to consistently remain at goal then recommend treatment adjustments. Patient unable to provide urine sample for microalbumin, recommend obtaining at follow-up visit. Recommend to continue with monitoring fasting glucose and follow a diabetic diet.

## 2022-06-22 NOTE — Patient Instructions (Signed)

## 2022-06-28 ENCOUNTER — Ambulatory Visit: Payer: Medicare PPO

## 2022-06-28 DIAGNOSIS — I6521 Occlusion and stenosis of right carotid artery: Secondary | ICD-10-CM

## 2022-08-03 ENCOUNTER — Other Ambulatory Visit: Payer: Self-pay | Admitting: Cardiology

## 2022-08-03 DIAGNOSIS — I10 Essential (primary) hypertension: Secondary | ICD-10-CM

## 2022-08-05 ENCOUNTER — Ambulatory Visit
Admission: EM | Admit: 2022-08-05 | Discharge: 2022-08-05 | Disposition: A | Discharge status: Home / Self Care | Payer: Medicare PPO | Attending: Emergency Medicine | Admitting: Emergency Medicine

## 2022-08-05 DIAGNOSIS — R0982 Postnasal drip: Secondary | ICD-10-CM

## 2022-08-05 DIAGNOSIS — J302 Other seasonal allergic rhinitis: Secondary | ICD-10-CM | POA: Diagnosis not present

## 2022-08-05 DIAGNOSIS — J329 Chronic sinusitis, unspecified: Secondary | ICD-10-CM

## 2022-08-05 MED ORDER — FLUTICASONE PROPIONATE 50 MCG/ACT NA SUSP: 1.0000 | Freq: Every day | NASAL | 2 refills | Status: DC

## 2022-08-05 MED ORDER — CETIRIZINE HCL 10 MG PO TABS: 10.0000 mg | ORAL_TABLET | Freq: Every day | ORAL | 2 refills | Status: DC

## 2022-08-05 NOTE — Discharge Instructions (Signed)
Your symptoms and my physical exam findings are concerning for exacerbation of your underlying allergies.     Please see the list below for recommended medications, dosages and frequencies to provide relief of current symptoms:     Zyrtec (cetirizine): This is an excellent second-generation antihistamine that helps to reduce respiratory inflammatory response to environmental allergens.  In some patients, this medication can cause daytime sleepiness so I recommend that you take 1 tablet daily at bedtime.     Flonase (fluticasone): This is a steroid nasal spray that you use once daily, 1 spray in each nare.  This medication does not work well if you decide to use it only used as you feel you need to, it works best used on a daily basis.  After 3 to 5 days of use, you will notice significant reduction of the inflammation and mucus production that is currently being caused by exposure to allergens, whether seasonal or environmental.  The most common side effect of this medication is nosebleeds.  If you experience a nosebleed, please discontinue use for 1 week, then feel free to resume.  I have provided you with a prescription.     If you find that you have not had improvement of your symptoms in the next 5 to 7 days, please follow-up with your primary care provider or return here to urgent care for repeat evaluation and further recommendations.   Thank you for visiting urgent care today.  We appreciate the opportunity to participate in your care.  

## 2022-08-05 NOTE — ED Provider Notes (Signed)
[facility]    CSN: [id number] Arrival date & time: 08/05/22  1914    HISTORY   Chief Complaint  Patient presents with   Cough   Nasal Congestion   Fever   Sore Throat   HPI Christopher Rasmussen is a pleasant, 76 y.o. male who presents to urgent care today. Patient complains of a 10-day history of nasal congestion, runny nose, nonproductive cough, subjective fever and sore throat.  Patient states he has been taking over-the-counter decongestants without relief of his symptoms.  Patient has normal vital signs on arrival today.  Patient denies history of allergies and asthma, states he believes that he had a sinus infection a year ago but does not recall what treatment was provided for him.  The history is provided by the patient.   Past Medical History:  Diagnosis Date   Abdominal aortic aneurysm (AAA) (HCC)    Abdominal aortic aneurysm (AAA) 30 to 34 mm in diameter (HCC) 10/04/2018   Aortic stenosis    Arthritis    Asymptomatic stenosis of right carotid artery 10/04/2018   Carotid stenosis    Heart block    High cholesterol    under control   Hypertension    Type II diabetes mellitus [facility])    Patient Active Problem List   Diagnosis Date Noted   Asymptomatic stenosis of right carotid artery 10/04/2018   Abdominal aortic aneurysm (AAA) 30 to 34 mm in diameter (HCC) 10/04/2018   Moderate obesity 10/04/2018   Osteoarthritis of right knee 12/19/2014   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/19/2014   Arthrofibrosis of total knee arthroplasty, left 10/17/2014   Arthritis of right knee 08/22/2014   Status post total left knee replacement 08/22/2014   Essential hypertension 02/22/2013   HLD (hyperlipidemia) 02/22/2013   Diabetes (HCC) 02/22/2013   Past Surgical History:  Procedure Laterality Date   CATARACT EXTRACTION     KNEE ARTHROSCOPY Left 1990's   KNEE CLOSED REDUCTION Left 10/17/2014   Procedure: CLOSED MANIPULATION LEFT KNEE UNDER ANESTHESIA;  Surgeon:  Kathryne Hitch, MD;  Location: [facility];  Service: Orthopedics;  Laterality: Left;   KNEE CLOSED REDUCTION Left 12/19/2014   Procedure: CLOSED MANIPULATION  LEFT KNEE;  Surgeon: Kathryne Hitch, MD;  Location: [facility];  Service: Orthopedics;  Laterality: Left;   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   Procedure: LEFT TOTAL KNEE ARTHROPLASTY;  Surgeon: Kathryne Hitch, MD;  Location: [facility];  Service: Orthopedics;  Laterality: Left;   TOTAL KNEE ARTHROPLASTY Right 12/19/2014   Procedure: RIGHT TOTAL KNEE ARTHROPLASTY;  Surgeon: Kathryne Hitch, MD;  Location: [facility];  Service: Orthopedics;  Laterality: Right;    Home Medications    Prior to Admission medications   Medication Sig Start Date End Date Taking? Authorizing Provider  amLODipine (NORVASC) 10 MG tablet TAKE 1 TABLET BY MOUTH DAILY 05/03/22   Mayer Masker, PA-C  aspirin EC 81 MG tablet Take 81 mg by mouth daily.    [provider]  cloNIDine (CATAPRES - DOSED IN MG/24 HR) 0.2 mg/24hr patch PLACE 1 PATCH ONTO THE SKIN ONCE A WEEK. 09/28/21   Yates Decamp, MD  labetalol (NORMODYNE) 200 MG tablet TAKE 1 TABLET BY MOUTH 2 TIMES DAILY. 08/03/22   Yates Decamp, MD  metFORMIN (GLUCOPHAGE) 1000 MG tablet TAKE 1 TABLET BY MOUTH TWICE A DAY 05/16/22   Abonza, Maritza, PA-C  OZEMPIC, 1 MG/DOSE, 2 MG/1.5ML SOPN Inject 1 mg as directed once a week. 03/02/22  Mayer Masker, PA-C  simvastatin (ZOCOR) 10 MG tablet TAKE 1 TABLET (10 MG TOTAL) BY MOUTH AT BEDTIME. 05/27/22   Abonza, Maritza, PA-C  valsartan-hydrochlorothiazide (DIOVAN-HCT) 320-12.5 MG tablet TAKE 1 TABLET BY MOUTH EVERY MORNING 12/27/21   Yates Decamp, MD  Vitamin D, Ergocalciferol, (DRISDOL) 1.25 MG (50000 UNIT) CAPS capsule Take 50,000 Units by mouth once a week. 06/27/19   [provider]    Family History Family History  Problem Relation Age of Onset   CAD Father    Heart attack Father    Stroke Mother    Anuerysm Mother    Social  History Social History   Tobacco Use   Smoking status: Never   Smokeless tobacco: Never  Vaping Use   Vaping Use: Never used  Substance Use Topics   Alcohol use: No   Drug use: No   Allergies   Patient has no known allergies.  Review of Systems Review of Systems Pertinent findings revealed after performing a 14 point review of systems has been noted in the history of present illness.  Physical Exam Triage Vital Signs ED Triage Vitals  Enc Vitals Group     BP 06/04/21 0827 (!) 147/82     Pulse Rate 06/04/21 0827 72     Resp 06/04/21 0827 18     Temp 06/04/21 0827 98.3 F (36.8 C)     Temp Source 06/04/21 0827 Oral     SpO2 06/04/21 0827 98 %     Weight --      Height --      Head Circumference --      Peak Flow --      Pain Score 06/04/21 0826 5     Pain Loc --      Pain Edu? --      Excl. in GC? --   No data found.  Updated Vital Signs BP 137/70 (BP Location: Left Arm)   Pulse 75   Temp 98.5 F (36.9 C) (Oral)   Resp 16   SpO2 97%   Physical Exam Vitals and nursing note reviewed.  Constitutional:      General: He is not in acute distress.    Appearance: Normal appearance. He is not ill-appearing.  HENT:     Head: Normocephalic and atraumatic.     Salivary Glands: Right salivary gland is not diffusely enlarged or tender. Left salivary gland is not diffusely enlarged or tender.     Right Ear: Ear canal and external ear normal. No drainage. A middle ear effusion is present. There is no impacted cerumen. Tympanic membrane is bulging. Tympanic membrane is not injected or erythematous.     Left Ear: Ear canal and external ear normal. No drainage. A middle ear effusion is present. There is no impacted cerumen. Tympanic membrane is bulging. Tympanic membrane is not injected or erythematous.     Ears:     Comments: Bilateral EACs normal, both TMs bulging with clear fluid    Nose: Rhinorrhea present. No nasal deformity, septal deviation, signs of injury, nasal  tenderness, mucosal edema or congestion. Rhinorrhea is clear.     Right Nostril: Occlusion present. No foreign body, epistaxis or septal hematoma.     Left Nostril: Occlusion present. No foreign body, epistaxis or septal hematoma.     Right Turbinates: Enlarged, swollen and pale.     Left Turbinates: Enlarged, swollen and pale.     Right Sinus: No maxillary sinus tenderness or frontal sinus tenderness.  Left Sinus: No maxillary sinus tenderness or frontal sinus tenderness.     Mouth/Throat:     Lips: Pink. No lesions.     Mouth: Mucous membranes are moist. No oral lesions.     Pharynx: Oropharynx is clear. Uvula midline. No posterior oropharyngeal erythema or uvula swelling.     Tonsils: No tonsillar exudate. 0 on the right. 0 on the left.     Comments: Postnasal drip Eyes:     General: Lids are normal.        Right eye: No discharge.        Left eye: No discharge.     Extraocular Movements: Extraocular movements intact.     Conjunctiva/sclera: Conjunctivae normal.     Right eye: Right conjunctiva is not injected.     Left eye: Left conjunctiva is not injected.  Neck:     Trachea: Trachea and phonation normal.  Cardiovascular:     Rate and Rhythm: Normal rate and regular rhythm.     Pulses: Normal pulses.     Heart sounds: Normal heart sounds. No murmur heard.    No friction rub. No gallop.  Pulmonary:     Effort: Pulmonary effort is normal. No accessory muscle usage, prolonged expiration or respiratory distress.     Breath sounds: Normal breath sounds. No stridor, decreased air movement or transmitted upper airway sounds. No decreased breath sounds, wheezing, rhonchi or rales.  Chest:     Chest wall: No tenderness.  Musculoskeletal:        General: Normal range of motion.     Cervical back: Normal range of motion and neck supple. Normal range of motion.  Lymphadenopathy:     Cervical: No cervical adenopathy.  Skin:    General: Skin is warm and dry.     Findings: No  erythema or rash.  Neurological:     General: No focal deficit present.     Mental Status: He is alert and oriented to person, place, and time.  Psychiatric:        Mood and Affect: Mood normal.        Behavior: Behavior normal.     Visual Acuity Right Eye Distance:   Left Eye Distance:   Bilateral Distance:    Right Eye Near:   Left Eye Near:    Bilateral Near:     UC Couse / Diagnostics / Procedures:     Radiology No results found.  Procedures Procedures (including critical care time) EKG  Pending results:  Labs Reviewed - No data to display  Medications Ordered in UC: Medications - No data to display  UC Diagnoses / Final Clinical Impressions(s)   I have reviewed the triage vital signs and the nursing notes.  Pertinent labs & imaging results that were available during my care of the patient were reviewed by me and considered in my medical decision making (see chart for details).    Final diagnoses:  Seasonal allergic rhinitis, unspecified trigger  Rhinosinusitis  Postnasal drip   Viral testing not indicated due to duration of symptoms.  Patient is well-appearing on exam with the exception of signs of respiratory allergies.  Patient advised physical exam findings and no need for antibiotics.  Patient provided with prescriptions for Zyrtec and Flonase.   Please see discharge instructions below for further details of plan of care as provided to patient. ED Prescriptions     Medication Sig Dispense Auth. Provider   fluticasone (FLONASE) 50 MCG/ACT nasal spray Place 1 spray into  both nostrils daily. Begin by using 2 sprays in each nare daily for 3 to 5 days, then decrease to 1 spray in each nare daily. 15.8 mL Theadora RamaMorgan, Abdoulie Tierce Scales, PA-C   cetirizine (ZYRTEC ALLERGY) 10 MG tablet Take 1 tablet (10 mg total) by mouth at bedtime. 30 tablet Theadora RamaMorgan, Dvaughn Fickle Scales, PA-C      PDMP not reviewed this encounter.  Disposition Upon Discharge:  Condition: stable for  discharge home Home: take medications as prescribed; routine discharge instructions as discussed; follow up as advised.  Patient presented with an acute illness with associated systemic symptoms and significant discomfort requiring urgent management. In my opinion, this is a condition that a prudent lay person (someone who possesses an average knowledge of health and medicine) may potentially expect to result in complications if not addressed urgently such as respiratory distress, impairment of bodily function or dysfunction of bodily organs.   Routine symptom specific, illness specific and/or disease specific instructions were discussed with the patient and/or caregiver at length.   As such, the patient has been evaluated and assessed, work-up was performed and treatment was provided in alignment with urgent care protocols and evidence based medicine.  Patient/parent/caregiver has been advised that the patient may require follow up for further testing and treatment if the symptoms continue in spite of treatment, as clinically indicated and appropriate.  If the patient was tested for COVID-19, Influenza and/or RSV, then the patient/parent/guardian was advised to isolate at home pending the results of his/her diagnostic coronavirus test and potentially longer if they're positive. I have also advised pt that if his/her COVID-19 test returns positive, it's recommended to self-isolate for at least 10 days after symptoms first appeared AND until fever-free for 24 hours without fever reducer AND other symptoms have improved or resolved. Discussed self-isolation recommendations as well as instructions for household member/close contacts as per the [facility] and [place] [facility], and also gave patient the COVID packet with this information.  Patient/parent/caregiver has been [facility] in 3-5 days if no better; to PCP or the Emergency Department if new signs and symptoms develop, or if the current signs  or symptoms continue to change or worsen for further workup, evaluation and treatment as clinically indicated and appropriate  The patient will follow up with their current PCP if and as advised. If the patient does not currently have a PCP we will assist them in obtaining one.   The patient may need specialty follow up if the symptoms continue, in spite of conservative treatment and management, for further workup, evaluation, consultation and treatment as clinically indicated and appropriate.  Patient/parent/caregiver verbalized understanding and agreement of plan as discussed.  All questions were addressed during visit.  Please see discharge instructions below for further details of plan.  Discharge Instructions:   Discharge Instructions      Your symptoms and my physical exam findings are concerning for exacerbation of your underlying allergies.     Please see the list below for recommended medications, dosages and frequencies to provide relief of current symptoms:     Zyrtec (cetirizine): This is an excellent second-generation antihistamine that helps to reduce respiratory inflammatory response to environmental allergens.  In some patients, this medication can cause daytime sleepiness so I recommend that you take 1 tablet daily at bedtime.     Flonase (fluticasone): This is a steroid nasal spray that you use once daily, 1 spray in each nare.  This medication does not work well if  you decide to use it only used as you feel you need to, it works best used on a daily basis.  After 3 to 5 days of use, you will notice significant reduction of the inflammation and mucus production that is currently being caused by exposure to allergens, whether seasonal or environmental.  The most common side effect of this medication is nosebleeds.  If you experience a nosebleed, please discontinue use for 1 week, then feel free to resume.  I have provided you with a prescription.     If you find that you have  not had improvement of your symptoms in the next 5 to 7 days, please follow-up with your primary care provider or return here to urgent care for repeat evaluation and further recommendations.   Thank you for visiting urgent care today.  We appreciate the opportunity to participate in your care.       This office note has been dictated using Teaching laboratory technician.  Unfortunately, this method of dictation can sometimes lead to typographical or grammatical errors.  I apologize for your inconvenience in advance if this occurs.  Please do not hesitate to reach out to me if clarification is needed.      Theadora Rama Scales, PA-C 08/05/22 1336

## 2022-08-05 NOTE — ED Triage Notes (Signed)
Pt reports having nasal congestion, cough, fever, and sore throat.  Started: 10 days ago   Home interventions: OTC decongestants

## 2022-08-22 ENCOUNTER — Other Ambulatory Visit: Payer: Self-pay | Admitting: Nurse Practitioner

## 2022-08-22 DIAGNOSIS — E1165 Type 2 diabetes mellitus with hyperglycemia: Secondary | ICD-10-CM

## 2022-08-22 NOTE — Telephone Encounter (Signed)
L.O.V: 06/22/22  N.O.V: 10/21/22  L.R.F: 03/02/22 9 ml 0 refill  Refill sent

## 2022-08-25 ENCOUNTER — Other Ambulatory Visit: Payer: Self-pay | Admitting: Nurse Practitioner

## 2022-08-25 DIAGNOSIS — E782 Mixed hyperlipidemia: Secondary | ICD-10-CM

## 2022-09-20 ENCOUNTER — Other Ambulatory Visit: Payer: Self-pay | Admitting: Cardiology

## 2022-09-20 DIAGNOSIS — I10 Essential (primary) hypertension: Secondary | ICD-10-CM

## 2022-09-21 ENCOUNTER — Other Ambulatory Visit: Payer: Self-pay | Admitting: Cardiology

## 2022-10-06 IMAGING — US US EXTREM LOW VENOUS*L*
1 series · 13 of 24 positions shown · non-contrast
Comparison: None Available.

CLINICAL DATA: 75-year-old male with a history of pain and swelling
left leg



[Series 1: us extrem low venous*left* · 0.07mm/px · 13 of 51 slices shown]
[im 1/51]
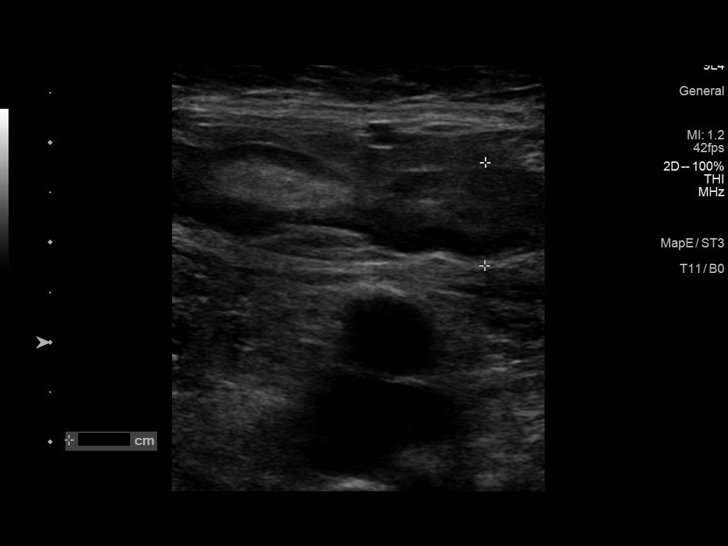
[im 5/51]
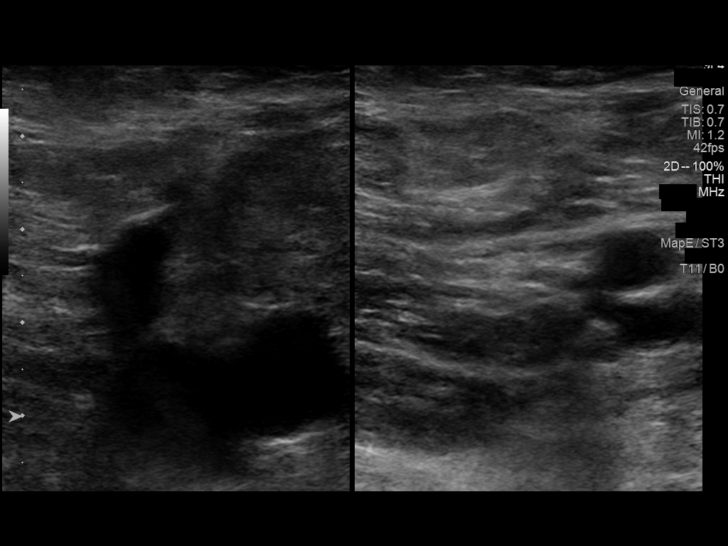
[im 9/51]
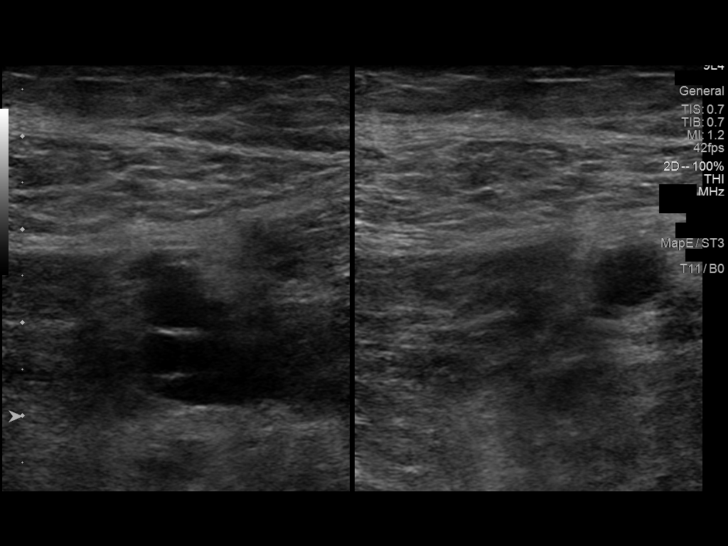
[im 14/51]
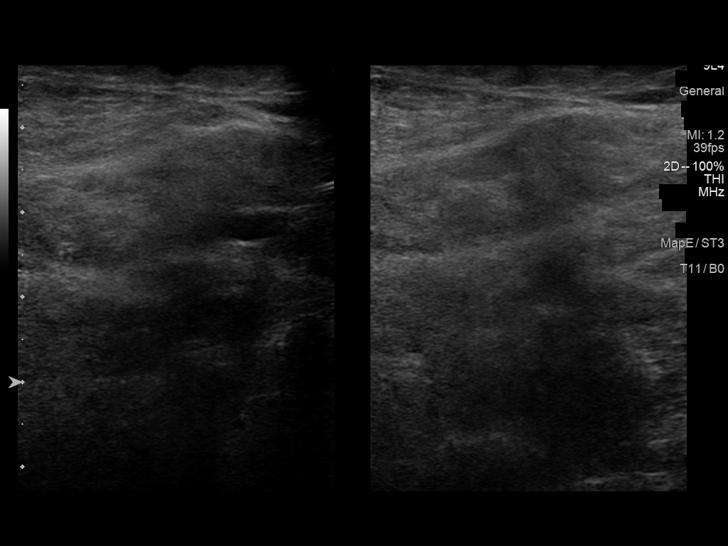
[im 18/51]
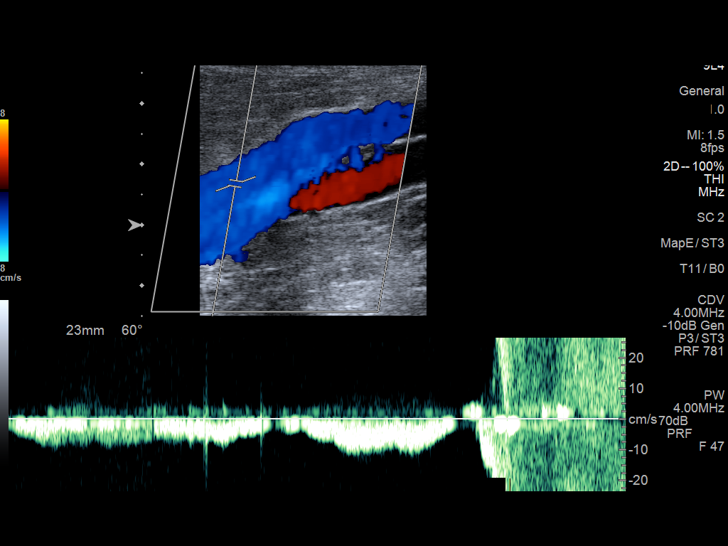
[im 22/51]
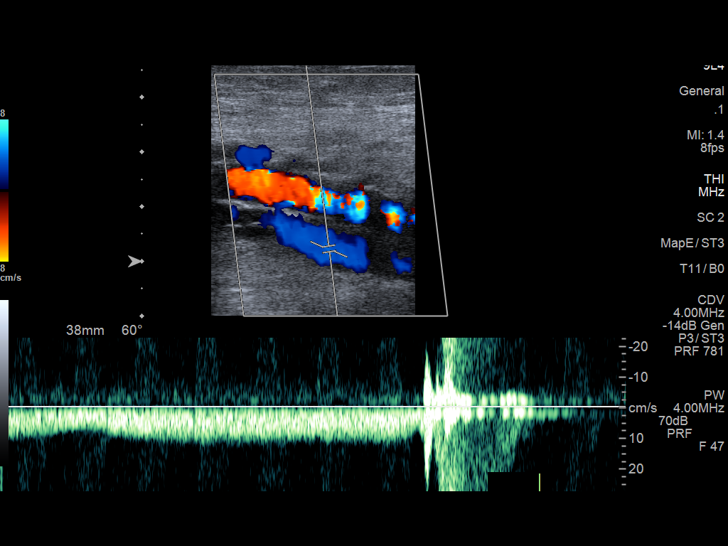
[im 27/51]
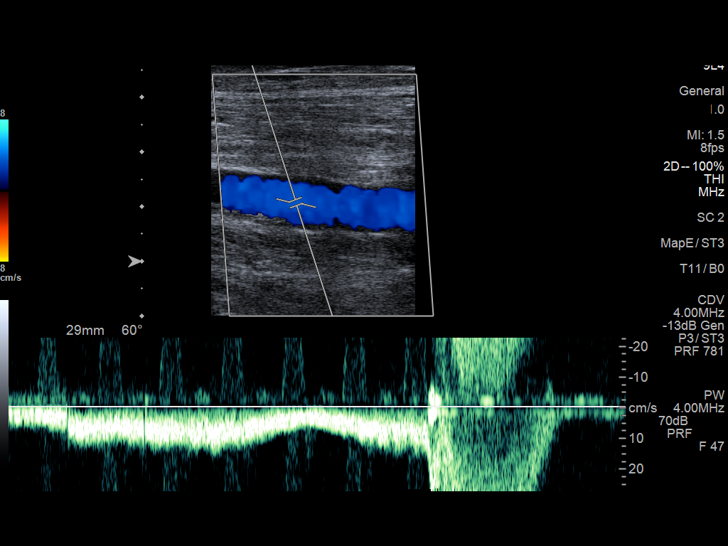
[im 29/51]
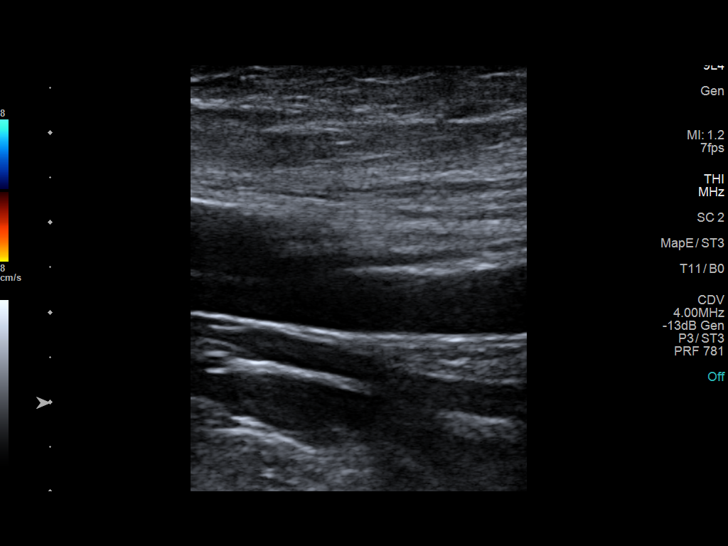
[im 33/51]
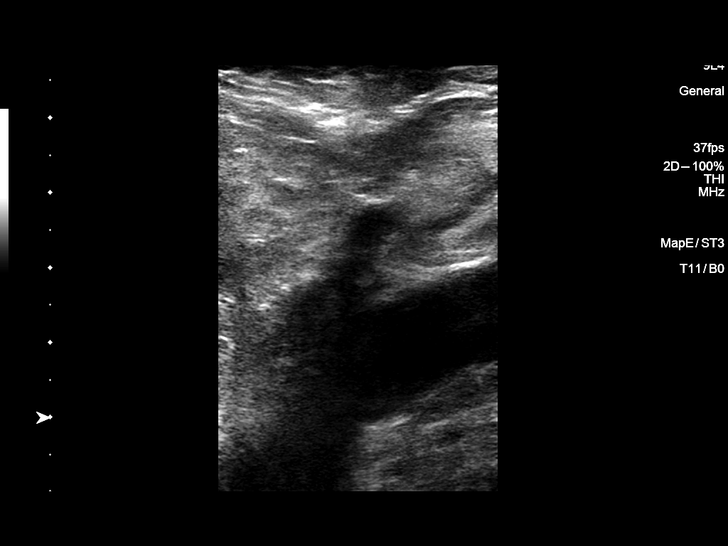
[im 37/51]
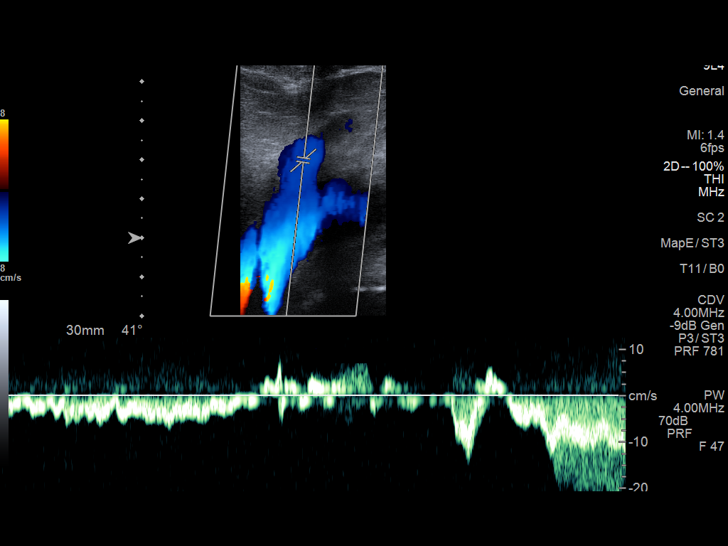
[im 42/51]
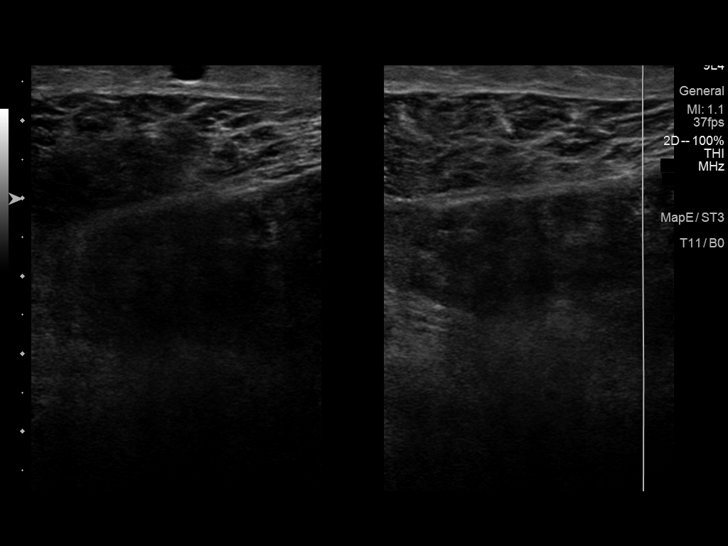
[im 46/51]
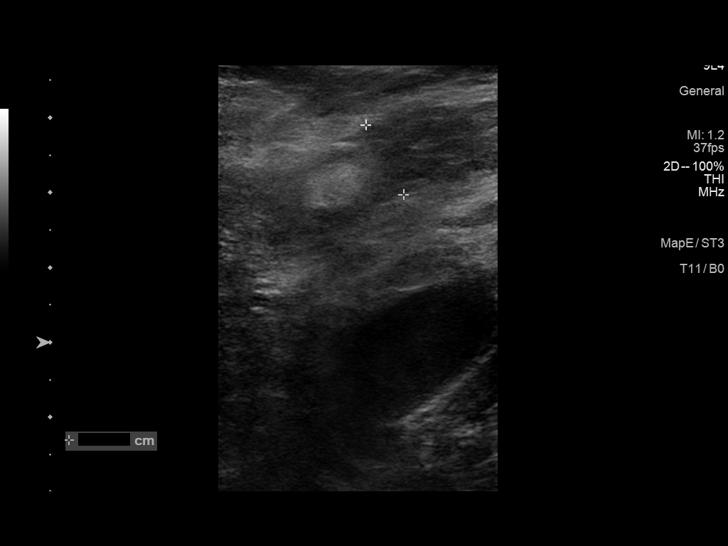
[im 51/51]
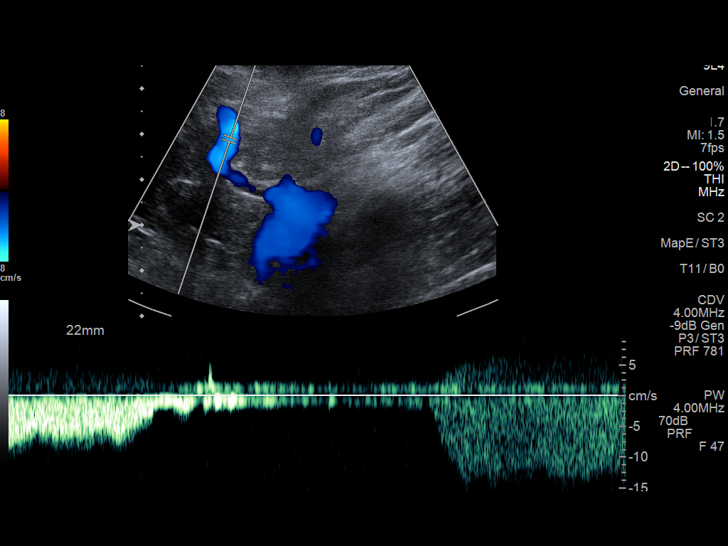

[13 of 24 positions shown; findings below may reference images not displayed]

FINDINGS: Contralateral Common Femoral Vein: Respiratory phasicity is normal
and symmetric with the symptomatic side. No evidence of thrombus.
Normal compressibility.

Common Femoral Vein: No evidence of thrombus. Normal
compressibility, respiratory phasicity and response to augmentation.

Saphenofemoral Junction: No evidence of thrombus. Normal
compressibility and flow on color Doppler imaging.

Profunda Femoral Vein: No evidence of thrombus. Normal
compressibility and flow on color Doppler imaging.

Femoral Vein: No evidence of thrombus. Normal compressibility,
respiratory phasicity and response to augmentation.

Popliteal Vein: No evidence of thrombus. Normal compressibility,
respiratory phasicity and response to augmentation.

Calf Veins: No evidence of thrombus. Normal compressibility and flow
on color Doppler imaging.

Superficial Great Saphenous Vein: No evidence of thrombus. Normal
compressibility and flow on color Doppler imaging.

Other Findings: Left inguinal lymph nodes with typical appearing
architecture, enlarged.
IMPRESSION: Directed duplex of the left lower extremity negative for DVT

Typical appearing left inguinal lymph nodes which are enlarged,
nonspecific, potentially reactive.

## 2022-10-12 ENCOUNTER — Other Ambulatory Visit: Payer: Self-pay

## 2022-10-12 DIAGNOSIS — E782 Mixed hyperlipidemia: Secondary | ICD-10-CM

## 2022-10-12 DIAGNOSIS — E1165 Type 2 diabetes mellitus with hyperglycemia: Secondary | ICD-10-CM

## 2022-10-12 DIAGNOSIS — Z0000 Encounter for general adult medical examination without abnormal findings: Secondary | ICD-10-CM

## 2022-10-13 ENCOUNTER — Other Ambulatory Visit: Payer: Medicare PPO

## 2022-10-13 DIAGNOSIS — Z0000 Encounter for general adult medical examination without abnormal findings: Secondary | ICD-10-CM

## 2022-10-13 DIAGNOSIS — E1165 Type 2 diabetes mellitus with hyperglycemia: Secondary | ICD-10-CM

## 2022-10-13 DIAGNOSIS — E782 Mixed hyperlipidemia: Secondary | ICD-10-CM

## 2022-10-14 LAB — COMPREHENSIVE METABOLIC PANEL
ALT: 20 IU/L (ref 0–44)
AST: 18 IU/L (ref 0–40)
Albumin/Globulin Ratio: 1.8 (ref 1.2–2.2)
Albumin: 4.7 g/dL (ref 3.8–4.8)
Alkaline Phosphatase: 86 IU/L (ref 44–121)
BUN/Creatinine Ratio: 20 (ref 10–24)
BUN: 14 mg/dL (ref 8–27)
Bilirubin Total: 1.1 mg/dL (ref 0.0–1.2)
CO2: 24 mmol/L (ref 20–29)
Calcium: 9.9 mg/dL (ref 8.6–10.2)
Chloride: 102 mmol/L (ref 96–106)
Creatinine, Ser: 0.69 mg/dL — ABNORMAL LOW (ref 0.76–1.27)
Globulin, Total: 2.6 g/dL (ref 1.5–4.5)
Glucose: 179 mg/dL — ABNORMAL HIGH (ref 70–99)
Potassium: 4.2 mmol/L (ref 3.5–5.2)
Sodium: 142 mmol/L (ref 134–144)
Total Protein: 7.3 g/dL (ref 6.0–8.5)
eGFR: 97 mL/min/{1.73_m2} (ref >59)

## 2022-10-14 LAB — LIPID PANEL
Chol/HDL Ratio: 4.1 ratio (ref 0.0–5.0)
Cholesterol, Total: 150 mg/dL (ref 100–199)
HDL: 37 mg/dL — ABNORMAL LOW (ref >39)
LDL Chol Calc (NIH): 89 mg/dL (ref 0–99)
Triglycerides: 132 mg/dL (ref 0–149)
VLDL Cholesterol Cal: 24 mg/dL (ref 5–40)

## 2022-10-14 LAB — HEMOGLOBIN A1C
Est. average glucose Bld gHb Est-mCnc: 166 mg/dL
Hgb A1c MFr Bld: 7.4 % — ABNORMAL HIGH (ref 4.8–5.6)

## 2022-10-15 LAB — MICROALBUMIN / CREATININE URINE RATIO
Creatinine, Urine: 100.7 mg/dL
Microalb/Creat Ratio: 78 mg/g creat — ABNORMAL HIGH (ref 0–29)
Microalbumin, Urine: 78.8 ug/mL

## 2022-10-17 ENCOUNTER — Other Ambulatory Visit: Payer: Self-pay | Admitting: Nurse Practitioner

## 2022-10-17 DIAGNOSIS — E1165 Type 2 diabetes mellitus with hyperglycemia: Secondary | ICD-10-CM

## 2022-10-21 ENCOUNTER — Encounter: Payer: Self-pay | Admitting: Family Medicine

## 2022-10-21 ENCOUNTER — Ambulatory Visit: Payer: Medicare PPO | Admitting: Family Medicine

## 2022-10-21 VITALS — BP 135/68 | HR 77 | Temp 98.0°F | Ht 68.0 in | Wt 216.0 lb

## 2022-10-21 DIAGNOSIS — I10 Essential (primary) hypertension: Secondary | ICD-10-CM | POA: Diagnosis not present

## 2022-10-21 DIAGNOSIS — E1165 Type 2 diabetes mellitus with hyperglycemia: Secondary | ICD-10-CM | POA: Diagnosis not present

## 2022-10-21 DIAGNOSIS — E782 Mixed hyperlipidemia: Secondary | ICD-10-CM

## 2022-10-21 MED ORDER — SIMVASTATIN 20 MG PO TABS: 20.0000 mg | ORAL_TABLET | Freq: Every day | ORAL | 3 refills | Status: DC

## 2022-10-21 MED ORDER — OZEMPIC (2 MG/DOSE) 8 MG/3ML ~~LOC~~ SOPN: 2.0000 mg | PEN_INJECTOR | SUBCUTANEOUS | 11 refills | Status: DC

## 2022-10-21 NOTE — Assessment & Plan Note (Addendum)
A1c 7.4.  Patient is tolerating Ozempic well, we discussed increasing to the 2 mg maintenance dose to lower A1c given moderate increase in urine albumin/creatinine ratio.  Patient is agreeable to this plan.  Sent in prescription for 2 mg Ozempic weekly to [facility], continue metformin 1000 mg twice daily.  Follow-up in 3 months to recheck A1c.  Will continue to monitor.

## 2022-10-21 NOTE — Assessment & Plan Note (Addendum)
Stable. Continue amlodipine 10 mg daily, valsartan-HCTZ 320-12.5 mg and labetalol 200 mg twice daily. Last CMP within normal limits.  Will continue to monitor.

## 2022-10-21 NOTE — Progress Notes (Signed)
Established Patient Office Visit  Subjective   Patient ID: BARD NATTRESS, male    DOB: 1945/10/24  Age: 77 y.o. MRN: [id number]  Chief Complaint  Patient presents with   Diabetes   Hyperlipidemia   Hypertension    Diabetes  Hyperlipidemia  Hypertension   Christopher Rasmussen is a 77 y.o. male presenting today for follow up of hypertension, hyperlipidemia, diabetes Hypertension: Patient here for follow-up of elevated blood pressure.  Pt denies chest pain, SOB, dizziness, edema, syncope, fatigue or heart palpitations. Taking labetalol, amlodipine, valsartan-HCTZ, reports excellent compliance with treatment. Denies side effects. Hyperlipidemia: tolerating simvastatin well with no myalgias or significant side effects.  The 10-year ASCVD risk score (Arnett DK, et al., 2019) is: 51% Diabetes: denies hypoglycemic events, wounds or sores that are not healing well, increased thirst or urination. Denies vision problems, eye exam February. Checking glucose at home, ranges have been around 170 fasting. Taking Ozempic 1mg  weekly and metformin 1000 mg twice daily as prescribed without any side effects.   ROS Negative unless otherwise noted in HPI   Objective:     BP 135/68   Pulse 77   Temp 98 F (36.7 C) (Oral)   Ht 5\' 8"  (1.727 m)   Wt 216 lb 0.6 oz (98 kg)   SpO2 98%   BMI 32.85 kg/m   Physical Exam Constitutional:      General: Christopher Rasmussen is not in acute distress.    Appearance: Normal appearance.  HENT:     Head: Normocephalic and atraumatic.  Eyes:     Extraocular Movements: Extraocular movements intact.     Conjunctiva/sclera: Conjunctivae normal.  Cardiovascular:     Rate and Rhythm: Normal rate and regular rhythm.     Pulses: Normal pulses.     Heart sounds: Murmur (Blowing systolic) heard.     No friction rub. No gallop.  Pulmonary:     Effort: Pulmonary effort is normal.     Breath sounds: Normal breath sounds.  Skin:    General: Skin is warm and dry.  Neurological:      Mental Status: Christopher Rasmussen is alert and oriented to person, place, and time.  Psychiatric:        Mood and Affect: Mood normal.     Assessment & Plan:  Essential hypertension Assessment & Plan: Stable. Continue amlodipine 10 mg daily, valsartan-HCTZ 320-12.5 mg and labetalol 200 mg twice daily. Last CMP within normal limits.  Will continue to monitor.   Type 2 diabetes mellitus with hyperglycemia, without long-term current use of insulin (HCC) Assessment & Plan: A1c 7.4.  Patient is tolerating Ozempic well, we discussed increasing to the 2 mg maintenance dose to lower A1c given moderate increase in urine albumin/creatinine ratio.  Patient is agreeable to this plan.  Sent in prescription for 2 mg Ozempic weekly to [facility], continue metformin 1000 mg twice daily.  Follow-up in 3 months to recheck A1c.  Will continue to monitor.  Orders: -     Ozempic (2 MG/DOSE); Inject 2 mg into the skin once a week.  Dispense: 3 mL; Refill: 11  Mixed hyperlipidemia Assessment & Plan: Last lipid panel: HDL 37, LDL 89, triglycerides 132.  Recommend to follow heart healthy diet low in fat and carbohydrates.  Increase simvastatin to 20 mg daily given increase in LDL.  Orders: -     Simvastatin; Take 1 tablet (20 mg total) by mouth at bedtime.  Dispense: 90 tablet; Refill: 3   Return in about 3 months (  around 01/21/2023) for follow-up, A1C check, fasting blood work 1 week before.    Velva Harman, PA

## 2022-10-21 NOTE — Assessment & Plan Note (Signed)
Last lipid panel: HDL 37, LDL 89, triglycerides 132.  Recommend to follow heart healthy diet low in fat and carbohydrates.  Increase simvastatin to 20 mg daily given increase in LDL.

## 2022-10-27 ENCOUNTER — Other Ambulatory Visit: Payer: Self-pay | Admitting: Nurse Practitioner

## 2022-11-10 ENCOUNTER — Other Ambulatory Visit: Payer: Self-pay | Admitting: Nurse Practitioner

## 2022-11-14 ENCOUNTER — Other Ambulatory Visit: Payer: Self-pay | Admitting: Family Medicine

## 2022-11-14 DIAGNOSIS — E1165 Type 2 diabetes mellitus with hyperglycemia: Secondary | ICD-10-CM

## 2022-11-22 ENCOUNTER — Other Ambulatory Visit: Payer: Self-pay | Admitting: Nurse Practitioner

## 2022-11-22 DIAGNOSIS — E782 Mixed hyperlipidemia: Secondary | ICD-10-CM

## 2022-12-09 ENCOUNTER — Other Ambulatory Visit: Payer: Medicare PPO

## 2022-12-12 ENCOUNTER — Ambulatory Visit: Payer: Medicare PPO | Admitting: Cardiology

## 2022-12-21 ENCOUNTER — Ambulatory Visit: Payer: Medicare PPO | Admitting: Cardiology

## 2022-12-22 ENCOUNTER — Ambulatory Visit (INDEPENDENT_AMBULATORY_CARE_PROVIDER_SITE_OTHER): Payer: Medicare PPO

## 2022-12-22 VITALS — Ht 71.0 in | Wt 216.0 lb

## 2022-12-22 DIAGNOSIS — Z0000 Encounter for general adult medical examination without abnormal findings: Secondary | ICD-10-CM | POA: Diagnosis not present

## 2022-12-22 DIAGNOSIS — Z1159 Encounter for screening for other viral diseases: Secondary | ICD-10-CM

## 2022-12-22 NOTE — Patient Instructions (Addendum)
[name] , Thank you for taking time to come for your Medicare Wellness Visit. I appreciate your ongoing commitment to your health goals. Please review the following plan we discussed and let me know if I can assist you in the future.   These are the goals we discussed:  Goals       Stay healthy (pt-stated)        This is a list of the screening recommended for you and due dates:  Health Maintenance  Topic Date Due   DTaP/Tdap/Td vaccine (1 - Tdap) Never done   Eye exam for diabetics  12/22/2022*   COVID-19 Vaccine (6 - 2023-24 season) 01/07/2023*   Zoster (Shingles) Vaccine (1 of 2) 03/24/2023*   Pneumonia Vaccine (1 of 1 - PCV) 03/31/2023*   Colon Cancer Screening  03/31/2023*   Hepatitis C Screening: USPSTF Recommendation to screen - Ages 18-79 yo.  12/22/2023*   Flu Shot  03/09/2023   Complete foot exam   03/31/2023   Hemoglobin A1C  04/15/2023   Yearly kidney function blood test for diabetes  10/13/2023   Yearly kidney health urinalysis for diabetes  10/13/2023   Medicare Annual Wellness Visit  12/22/2023   HPV Vaccine  Aged Out  *Topic was postponed. The date shown is not the original due date.    Advanced directives: None  Conditions/risks identified: None  Next appointment: Follow up in one year for your annual wellness visit.   Preventive Care 39 Years and Older, Male  Preventive care refers to lifestyle choices and visits with your health care provider that can promote health and wellness. What does preventive care include? A yearly physical exam. This is also called an annual well check. Dental exams once or twice a year. Routine eye exams. Ask your health care provider how often you should have your eyes checked. Personal lifestyle choices, including: Daily care of your teeth and gums. Regular physical activity. Eating a healthy diet. Avoiding tobacco and drug use. Limiting alcohol use. Practicing safe sex. Taking low doses of aspirin every day. Taking  vitamin and mineral supplements as recommended by your health care provider. What happens during an annual well check? The services and screenings done by your health care provider during your annual well check will depend on your age, overall health, lifestyle risk factors, and family history of disease. Counseling  Your health care provider may ask you questions about your: Alcohol use. Tobacco use. Drug use. Emotional well-being. Home and relationship well-being. Sexual activity. Eating habits. History of falls. Memory and ability to understand (cognition). Work and work Astronomer. Screening  You may have the following tests or measurements: Height, weight, and BMI. Blood pressure. Lipid and cholesterol levels. These may be checked every 5 years, or more frequently if you are over 56 years old. Skin check. Lung cancer screening. You may have this screening every year starting at age 36 if you have a 30-pack-year history of smoking and currently smoke or have quit within the past 15 years. Fecal occult blood test (FOBT) of the stool. You may have this test every year starting at age 45. Flexible sigmoidoscopy or colonoscopy. You may have a sigmoidoscopy every 5 years or a colonoscopy every 10 years starting at age 24. Prostate cancer screening. Recommendations will vary depending on your family history and other risks. Hepatitis C blood test. Hepatitis B blood test. Sexually transmitted disease (STD) testing. Diabetes screening. This is done by checking your blood sugar (glucose) after you have not eaten  for a while (fasting). You may have this done every 1-3 years. Abdominal aortic aneurysm (AAA) screening. You may need this if you are a current or former smoker. Osteoporosis. You may be screened starting at age 68 if you are at high risk. Talk with your health care provider about your test results, treatment options, and if necessary, the need for more tests. Vaccines  Your  health care provider may recommend certain vaccines, such as: Influenza vaccine. This is recommended every year. Tetanus, diphtheria, and acellular pertussis (Tdap, Td) vaccine. You may need a Td booster every 10 years. Zoster vaccine. You may need this after age 32. Pneumococcal 13-valent conjugate (PCV13) vaccine. One dose is recommended after age 48. Pneumococcal polysaccharide (PPSV23) vaccine. One dose is recommended after age 88. Talk to your health care provider about which screenings and vaccines you need and how often you need them. This information is not intended to replace advice given to you by your health care provider. Make sure you discuss any questions you have with your health care provider. Document Released: 08/21/2015 Document Revised: 04/13/2016 Document Reviewed: 05/26/2015 Elsevier Interactive Patient Education  2017 [employer] Prevention in the Home Falls can cause injuries. They can happen to people of all ages. There are many things you can do to make your home safe and to help prevent falls. What can I do on the outside of my home? Regularly fix the edges of walkways and driveways and fix any cracks. Remove anything that might make you trip as you walk through a door, such as a raised step or threshold. Trim any bushes or trees on the path to your home. Use bright outdoor lighting. Clear any walking paths of anything that might make someone trip, such as rocks or tools. Regularly check to see if handrails are loose or broken. Make sure that both sides of any steps have handrails. Any raised decks and porches should have guardrails on the edges. Have any leaves, snow, or ice cleared regularly. Use sand or salt on walking paths during winter. Clean up any spills in your garage right away. This includes oil or grease spills. What can I do in the bathroom? Use night lights. Install grab bars by the toilet and in the tub and shower. Do not use towel bars as  grab bars. Use non-skid mats or decals in the tub or shower. If you need to sit down in the shower, use a plastic, non-slip stool. Keep the floor dry. Clean up any water that spills on the floor as soon as it happens. Remove soap buildup in the tub or shower regularly. Attach bath mats securely with double-sided non-slip rug tape. Do not have throw rugs and other things on the floor that can make you trip. What can I do in the bedroom? Use night lights. Make sure that you have a light by your bed that is easy to reach. Do not use any sheets or blankets that are too big for your bed. They should not hang down onto the floor. Have a firm chair that has side arms. You can use this for support while you get dressed. Do not have throw rugs and other things on the floor that can make you trip. What can I do in the kitchen? Clean up any spills right away. Avoid walking on wet floors. Keep items that you use a lot in easy-to-reach places. If you need to reach something above you, use a strong step stool that has  a grab bar. Keep electrical cords out of the way. Do not use floor polish or wax that makes floors slippery. If you must use wax, use non-skid floor wax. Do not have throw rugs and other things on the floor that can make you trip. What can I do with my stairs? Do not leave any items on the stairs. Make sure that there are handrails on both sides of the stairs and use them. Fix handrails that are broken or loose. Make sure that handrails are as long as the stairways. Check any carpeting to make sure that it is firmly attached to the stairs. Fix any carpet that is loose or worn. Avoid having throw rugs at the top or bottom of the stairs. If you do have throw rugs, attach them to the floor with carpet tape. Make sure that you have a light switch at the top of the stairs and the bottom of the stairs. If you do not have them, ask someone to add them for you. What else can I do to help prevent  falls? Wear shoes that: Do not have high heels. Have rubber bottoms. Are comfortable and fit you well. Are closed at the toe. Do not wear sandals. If you use a stepladder: Make sure that it is fully opened. Do not climb a closed stepladder. Make sure that both sides of the stepladder are locked into place. Ask someone to hold it for you, if possible. Clearly mark and make sure that you can see: Any grab bars or handrails. First and last steps. Where the edge of each step is. Use tools that help you move around (mobility aids) if they are needed. These include: Canes. Walkers. Scooters. Crutches. Turn on the lights when you go into a dark area. Replace any light bulbs as soon as they burn out. Set up your furniture so you have a clear path. Avoid moving your furniture around. If any of your floors are uneven, fix them. If there are any pets around you, be aware of where they are. Review your medicines with your doctor. Some medicines can make you feel dizzy. This can increase your chance of falling. Ask your doctor what other things that you can do to help prevent falls. This information is not intended to replace advice given to you by your health care provider. Make sure you discuss any questions you have with your health care provider. Document Released: 05/21/2009 Document Revised: 12/31/2015 Document Reviewed: 08/29/2014 Elsevier Interactive Patient Education  2017 [employer].

## 2022-12-22 NOTE — Addendum Note (Signed)
Addended by: Keltin Baird on: 12/22/2022 04:18 PM   Modules accepted: Orders  

## 2022-12-22 NOTE — Progress Notes (Signed)
Subjective:   Christopher Rasmussen is a 77 y.o. male who presents for Medicare Annual/Subsequent preventive examination.  Review of Systems    Virtual Visit via Telephone Note  I connected with  Christopher Rasmussen on 12/22/22 at  9:00 AM EDT by telephone and verified that I am speaking with the correct person using two identifiers.  Location: Patient: Home Provider: Office Persons participating in the virtual visit: patient/Nurse Health Advisor   I discussed the limitations, risks, security and privacy concerns of performing an evaluation and management service by telephone and the availability of in person appointments. The patient expressed understanding and agreed to proceed.  Interactive audio and video telecommunications were attempted between this nurse and patient, however failed, due to patient having technical difficulties OR patient did not have access to video capability.  We continued and completed visit with audio only.  Some vital signs may be absent or patient reported.   [name], LPN  Cardiac Risk Factors include: advanced age (>29men, >18 women);male gender;diabetes mellitus;hypertension     Objective:    Today's Vitals   12/22/22 0854  Weight: 216 lb (98 kg)  Height: 5\' 11"  (1.803 m)   Body mass index is 30.13 kg/m.     12/22/2022    9:04 AM 10/21/2022   10:07 AM 04/15/2021    9:24 AM 01/13/2015    9:36 AM 12/19/2014   11:30 PM 12/15/2014    1:18 PM 10/17/2014    2:22 PM  Advanced Directives  Does Patient Have a Medical Advance Directive? Yes Yes No No No No No  Type of Estate agent of [place];Living will        Copy of Healthcare Power of Attorney in Chart? No - copy requested        Would patient like information on creating a medical advance directive?    No - patient declined information No - patient declined information No - patient declined information No - patient declined information    Current Medications (verified) Outpatient  Encounter Medications as of 12/22/2022  Medication Sig   amLODipine (NORVASC) 10 MG tablet TAKE 1 TABLET BY MOUTH DAILY   aspirin EC 81 MG tablet Take 81 mg by mouth daily.   cetirizine (ZYRTEC ALLERGY) 10 MG tablet Take 1 tablet (10 mg total) by mouth at bedtime.   cloNIDine (CATAPRES - DOSED IN MG/24 HR) 0.2 mg/24hr patch PLACE 1 PATCH ONTO THE SKIN ONCE A WEEK.   fluticasone (FLONASE) 50 MCG/ACT nasal spray Place 1 spray into both nostrils daily. Begin by using 2 sprays in each nare daily for 3 to 5 days, then decrease to 1 spray in each nare daily.   labetalol (NORMODYNE) 200 MG tablet TAKE 1 TABLET BY MOUTH 2 TIMES DAILY.   metFORMIN (GLUCOPHAGE) 1000 MG tablet TAKE 1 TABLET BY MOUTH TWICE A DAY   Semaglutide, 2 MG/DOSE, (OZEMPIC, 2 MG/DOSE,) 8 MG/3ML SOPN Inject 2 mg into the skin once a week.   simvastatin (ZOCOR) 20 MG tablet Take 1 tablet (20 mg total) by mouth at bedtime.   valsartan-hydrochlorothiazide (DIOVAN-HCT) 320-12.5 MG tablet TAKE 1 TABLET BY MOUTH EVERY MORNING   Vitamin D, Ergocalciferol, (DRISDOL) 1.25 MG (50000 UNIT) CAPS capsule Take 50,000 Units by mouth once a week.   No facility-administered encounter medications on file as of 12/22/2022.    Allergies (verified) Patient has no known allergies.   History: Past Medical History:  Diagnosis Date   Abdominal aortic aneurysm (AAA) (HCC)  Abdominal aortic aneurysm (AAA) 30 to 34 mm in diameter (HCC) 10/04/2018   Aortic stenosis    Arthritis    Asymptomatic stenosis of right carotid artery 10/04/2018   Carotid stenosis    Heart block    High cholesterol    under control   Hypertension    Type II diabetes mellitus (HCC)    Past Surgical History:  Procedure Laterality Date   CATARACT EXTRACTION     KNEE ARTHROSCOPY Left 1990's   KNEE CLOSED REDUCTION Left 10/17/2014   Procedure: CLOSED MANIPULATION LEFT KNEE UNDER ANESTHESIA;  Surgeon: Kathryne Hitch, MD;  Location: [facility];  Service: Orthopedics;   Laterality: Left;   KNEE CLOSED REDUCTION Left 12/19/2014   Procedure: CLOSED MANIPULATION  LEFT KNEE;  Surgeon: Kathryne Hitch, MD;  Location: [facility];  Service: Orthopedics;  Laterality: Left;   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   Procedure: LEFT TOTAL KNEE ARTHROPLASTY;  Surgeon: Kathryne Hitch, MD;  Location: [facility];  Service: Orthopedics;  Laterality: Left;   TOTAL KNEE ARTHROPLASTY Right 12/19/2014   Procedure: RIGHT TOTAL KNEE ARTHROPLASTY;  Surgeon: Kathryne Hitch, MD;  Location: [facility];  Service: Orthopedics;  Laterality: Right;   Family History  Problem Relation Age of Onset   CAD Father    Heart attack Father    Stroke Mother    Anuerysm Mother    Social History   Socioeconomic History   Marital status: Married    Spouse name: Faruq Hagee   Number of children: 2   Years of education: Not on file   Highest education level: Not on file  Occupational History   Not on file  Tobacco Use   Smoking status: Never   Smokeless tobacco: Never  Vaping Use   Vaping Use: Never used  Substance and Sexual Activity   Alcohol use: No   Drug use: No   Sexual activity: Not Currently    Birth control/protection: None  Other Topics Concern   Not on file  Social History Narrative   Not on file   Social Determinants of Health   Financial Resource Strain: Low Risk  (12/22/2022)   Overall Financial Resource Strain (CARDIA)    Difficulty of Paying Living Expenses: Not hard at all  Food Insecurity: No Food Insecurity (12/22/2022)   Hunger Vital Sign    Worried About Running Out of Food in the Last Year: Never true    Ran Out of Food in the Last Year: Never true  Transportation Needs: No Transportation Needs (12/22/2022)   PRAPARE - Administrator, Civil Service (Medical): No    Lack of Transportation (Non-Medical): No  Physical Activity: Inactive (12/22/2022)   Exercise Vital Sign    Days of Exercise per Week: 0 days    Minutes of Exercise per  Session: 0 min  Stress: No Stress Concern Present (12/22/2022)   [employer] of Occupational Health - Occupational Stress Questionnaire    Feeling of Stress : Not at all  Social Connections: Socially Integrated (12/22/2022)   Social Connection and Isolation Panel [NHANES]    Frequency of Communication with Friends and Family: More than three times a week    Frequency of Social Gatherings with Friends and Family: More than three times a week    Attends Religious Services: More than 4 times per year    Active Member of [employer] or Organizations: Yes    Attends Banker Meetings: More than 4 times per year  Marital Status: Married    Tobacco Counseling Counseling given: Not Answered   Clinical Intake:  Pre-visit preparation completed: No  Pain : No/denies pain Nutrition Risk Assessment:  Has the patient had any N/V/D within the last 2 months?  No  Does the patient have any non-healing wounds?  No  Has the patient had any unintentional weight loss or weight gain?  No   Diabetes:  Is the patient diabetic?  Yes  If diabetic, was a CBG obtained today?  Yes  CBG 163 Taken by patient Did the patient bring in their glucometer from home?  No  How often do you monitor your CBG's? Daily.   Financial Strains and Diabetes Management:  Are you having any financial strains with the device, your supplies or your medication? No .  Does the patient want to be seen by Chronic Care Management for management of their diabetes?  No  Would the patient like to be referred to a Nutritionist or for Diabetic Management?  No   Diabetic Exams:  Diabetic Eye Exam: Completed . Overdue for diabetic eye exam. Pt has been advised about the importance in completing this exam. A referral has been placed today. Message sent to referral coordinator for scheduling purposes. Advised pt to expect a call from office referred to regarding appt.  Diabetic Foot Exam: Completed . Pt has been advised  about the importance in completing this exam. Pt is scheduled for diabetic foot exam on Followed by PCP.      BMI - recorded: 30.13 Nutritional Risks: None Diabetes: Yes CBG done?: Yes (CBG 163 Taken by patient) CBG resulted in Enter/ Edit results?: Yes Did pt. bring in CBG monitor from home?: No  How often do you need to have someone help you when you read instructions, pamphlets, or other written materials from your doctor or pharmacy?: 1 - Never  Diabetic?  Yes  Interpreter Needed?: No  Information entered by :: Theresa Mulligan LPN   Activities of Daily Living    12/22/2022    9:02 AM 03/30/2022   10:11 AM  In your present state of health, do you have any difficulty performing the following activities:  Hearing? 0 0  Vision? 0 0  Difficulty concentrating or making decisions? 0 0  Walking or climbing stairs? 0 0  Dressing or bathing? 0 0  Doing errands, shopping? 0 0  Preparing Food and eating ? N   Using the Toilet? N   In the past six months, have you accidently leaked urine? N   Do you have problems with loss of bowel control? N   Managing your Medications? N   Managing your Finances? N   Housekeeping or managing your Housekeeping? N     Patient Care Team: Melida Quitter, PA as PCP - General (Family Medicine)  Indicate any recent Medical Services you may have received from other than [facility] providers in the past year (date may be approximate).     Assessment:   This is a routine wellness examination for Kjuan.  Hearing/Vision screen Hearing Screening - Comments:: Denies hearing difficulties   Vision Screening - Comments:: Wears rx glasses - up to date with routine eye exams with  Dr Artemio Aly  Dietary issues and exercise activities discussed: Current Exercise Habits: The patient does not participate in regular exercise at present;Home exercise routine, Type of exercise: walking, Exercise limited by: None identified   Goals Addressed  This  Visit's Progress     Stay healthy (pt-stated)         Depression Screen    12/22/2022    9:01 AM 10/21/2022   10:06 AM 06/22/2022   10:13 AM 03/30/2022   10:10 AM 01/25/2022    8:29 AM 11/26/2021   10:00 AM  PHQ 2/9 Scores  PHQ - 2 Score 0 0 0 0 0 0  PHQ- 9 Score  0 0 0 0 0    Fall Risk    12/22/2022    9:03 AM 10/21/2022   10:06 AM 06/22/2022   10:13 AM 03/30/2022   10:10 AM 01/25/2022    8:29 AM  Fall Risk   Falls in the past year? 0 0 0 0 0  Number falls in past yr: 0 0 0 0 0  Injury with Fall? 0 0 0 0 0  Risk for fall due to : No Fall Risks   No Fall Risks No Fall Risks  Follow up Falls prevention discussed Falls evaluation completed  Falls evaluation completed Education provided;Falls evaluation completed    FALL RISK PREVENTION PERTAINING TO THE HOME:  Any stairs in or around the home? Yes  If so, are there any without handrails? No  Home free of loose throw rugs in walkways, pet beds, electrical cords, etc? Yes  Adequate lighting in your home to reduce risk of falls? Yes   ASSISTIVE DEVICES UTILIZED TO PREVENT FALLS:  Life alert? No  Use of a cane, walker or w/c? No  Grab bars in the bathroom? No  Shower chair or bench in shower? No  Elevated toilet seat or a handicapped toilet? No   TIMED UP AND GO:  Was the test performed? No . Audio Visit  Cognitive Function:        12/22/2022    9:04 AM  6CIT Screen  What Year? 0 points  What month? 0 points  What time? 0 points  Count back from 20 0 points  Months in reverse 0 points  Repeat phrase 0 points  Total Score 0 points    Immunizations Immunization History  Administered Date(s) Administered   Influenza, High Dose Seasonal PF 07/09/2019   PFIZER(Purple Top)SARS-COV-2 Vaccination 10/01/2019, 10/22/2019, 07/07/2020, 01/20/2021   Pfizer Covid-19 Vaccine Bivalent Booster 67yrs & up 10/04/2021    TDAP status: Due, Education has been provided regarding the importance of this vaccine. Advised may  receive this vaccine at local pharmacy or Health Dept. Aware to provide a copy of the vaccination record if obtained from local pharmacy or Health Dept. Verbalized acceptance and understanding.  Flu Vaccine status: Up to date  Pneumococcal vaccine status: Up to date  Covid-19 vaccine status: Completed vaccines  Qualifies for Shingles Vaccine? Yes   Zostavax completed Yes   Shingrix Completed?: Yes  Screening Tests Health Maintenance  Topic Date Due   DTaP/Tdap/Td (1 - Tdap) Never done   OPHTHALMOLOGY EXAM  12/22/2022 (Originally 07/29/1956)   COVID-19 Vaccine (6 - 2023-24 season) 01/07/2023 (Originally 04/08/2022)   Zoster Vaccines- Shingrix (1 of 2) 03/24/2023 (Originally 07/29/1997)   Pneumonia Vaccine 64+ Years old (1 of 1 - PCV) 03/31/2023 (Originally 07/29/2012)   COLONOSCOPY (Pts 45-95yrs Insurance coverage will need to be confirmed)  03/31/2023 (Originally 07/29/1992)   Hepatitis C Screening  12/22/2023 (Originally 07/29/1964)   INFLUENZA VACCINE  03/09/2023   FOOT EXAM  03/31/2023   HEMOGLOBIN A1C  04/15/2023   Diabetic kidney evaluation - eGFR measurement  10/13/2023   Diabetic kidney  evaluation - Urine ACR  10/13/2023   Medicare Annual Wellness (AWV)  12/22/2023   HPV VACCINES  Aged Out    Health Maintenance  Health Maintenance Due  Topic Date Due   DTaP/Tdap/Td (1 - Tdap) Never done    Colorectal cancer screening: Referral to GI placed Patient declined. Pt aware the office will call re: appt.  Lung Cancer Screening: (Low Dose CT Chest recommended if Age 93-80 years, 30 pack-year currently smoking OR have quit w/in 15years.) does not qualify.     Additional Screening:  Hepatitis C Screening: does qualify;  Deferred  Vision Screening: Recommended annual ophthalmology exams for early detection of glaucoma and other disorders of the eye. Is the patient up to date with their annual eye exam?  Yes  Who is the provider or what is the name of the office in which the  patient attends annual eye exams? Dr Artemio Aly If pt is not established with a provider, would they like to be referred to a provider to establish care? No .   Dental Screening: Recommended annual dental exams for proper oral hygiene  Community Resource Referral / Chronic Care Management:  CRR required this visit?  No   CCM required this visit?  No      Plan:     I have personally reviewed and noted the following in the patient's chart:   Medical and social history Use of alcohol, tobacco or illicit drugs  Current medications and supplements including opioid prescriptions. Patient is not currently taking opioid prescriptions. Functional ability and status Nutritional status Physical activity Advanced directives List of other physicians Hospitalizations, surgeries, and ER visits in previous 12 months Vitals Screenings to include cognitive, depression, and falls Referrals and appointments  In addition, I have reviewed and discussed with patient certain preventive protocols, quality metrics, and best practice recommendations. A written personalized care plan for preventive services as well as general preventive health recommendations were provided to patient.     [name], LPN   1/61/0960   Nurse Notes: Patient due Hep-C Screening

## 2022-12-27 ENCOUNTER — Ambulatory Visit: Payer: Medicare PPO

## 2022-12-27 DIAGNOSIS — I6521 Occlusion and stenosis of right carotid artery: Secondary | ICD-10-CM

## 2023-01-02 NOTE — Progress Notes (Signed)
Carotid artery duplex 12/27/2022: Duplex suggests stenosis in the right internal carotid artery (50-69%). No evidence of significant stenosis in the left carotid vessels. Antegrade right vertebral artery flow. Antegrade left vertebral artery flow. No significant change from 06/28/2022. Follow up in six months is appropriate if clinically indicated.

## 2023-01-03 ENCOUNTER — Ambulatory Visit: Payer: Medicare PPO | Admitting: Cardiology

## 2023-01-03 ENCOUNTER — Encounter: Payer: Self-pay | Admitting: Cardiology

## 2023-01-03 VITALS — BP 130/63 | HR 82 | Resp 15 | Ht 71.0 in | Wt 220.0 lb

## 2023-01-03 DIAGNOSIS — I350 Nonrheumatic aortic (valve) stenosis: Secondary | ICD-10-CM

## 2023-01-03 DIAGNOSIS — I10 Essential (primary) hypertension: Secondary | ICD-10-CM

## 2023-01-03 DIAGNOSIS — I6521 Occlusion and stenosis of right carotid artery: Secondary | ICD-10-CM

## 2023-01-03 DIAGNOSIS — E7800 Pure hypercholesterolemia, unspecified: Secondary | ICD-10-CM

## 2023-01-03 MED ORDER — ATORVASTATIN CALCIUM 20 MG PO TABS: 20.0000 mg | ORAL_TABLET | Freq: Every day | ORAL | 3 refills | Status: DC

## 2023-01-03 NOTE — Progress Notes (Signed)
Primary Physician/Referring:  Melida Quitter, PA  Patient ID: Christopher Rasmussen, male    DOB: February 16, 1946, 77 y.o.   MRN: [id number]  Chief Complaint  Patient presents with   Asymptomatic stenosis of right carotid arter   Follow-up    1 year   HPI:    Christopher Rasmussen  is a 77 y.o. Caucasian male with mild aortic stenosis, uncontrolled diabetes mellitus, moderate obesity, hypertension and hyperlipidemia presents here for annual visit for management of carotid stenosis and mild aortic stenosis.  He is presently doing well, essentially remains asymptomatic.   Past Medical History:  Diagnosis Date   Abdominal aortic aneurysm (AAA) (HCC)    Abdominal aortic aneurysm (AAA) 30 to 34 mm in diameter (HCC) 10/04/2018   Aortic stenosis    Arthritis    Asymptomatic stenosis of right carotid artery 10/04/2018   Carotid stenosis    Heart block    High cholesterol    under control   Hypertension    Type II diabetes mellitus (HCC)    Past Surgical History:  Procedure Laterality Date   CATARACT EXTRACTION     KNEE ARTHROSCOPY Left 1990's   KNEE CLOSED REDUCTION Left 10/17/2014   Procedure: CLOSED MANIPULATION LEFT KNEE UNDER ANESTHESIA;  Surgeon: Kathryne Hitch, MD;  Location: [facility];  Service: Orthopedics;  Laterality: Left;   KNEE CLOSED REDUCTION Left 12/19/2014   Procedure: CLOSED MANIPULATION  LEFT KNEE;  Surgeon: Kathryne Hitch, MD;  Location: [facility];  Service: Orthopedics;  Laterality: Left;   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   Procedure: LEFT TOTAL KNEE ARTHROPLASTY;  Surgeon: Kathryne Hitch, MD;  Location: [facility];  Service: Orthopedics;  Laterality: Left;   TOTAL KNEE ARTHROPLASTY Right 12/19/2014   Procedure: RIGHT TOTAL KNEE ARTHROPLASTY;  Surgeon: Kathryne Hitch, MD;  Location: [facility];  Service: Orthopedics;  Laterality: Right;   Family History  Problem Relation Age of Onset   CAD Father    Heart attack Father    Stroke Mother    Anuerysm  Mother     Social History   Tobacco Use   Smoking status: Never   Smokeless tobacco: Never  Substance Use Topics   Alcohol use: No   Marital Status: Married  ROS  Review of Systems  Cardiovascular:  Negative for chest pain, dyspnea on exertion and leg swelling.  Gastrointestinal:  Negative for melena.   Objective  Blood pressure 130/63, pulse 82, resp. rate 15, height 5\' 11"  (1.803 m), weight 220 lb (99.8 kg), SpO2 96 %.     01/03/2023    1:07 PM 12/22/2022    8:54 AM 10/21/2022   10:04 AM  Vitals with BMI  Height 5\' 11"  5\' 11"  5\' 8"   Weight 220 lbs 216 lbs 216 lbs 1 oz  BMI 30.7 30.14 32.86  Systolic 130  135  Diastolic 63  68  Pulse 82  77     Physical Exam Constitutional:      Appearance: He is obese.     Comments: moderately obese  Neck:     Vascular: Carotid bruit (BILATERAL) present. No JVD.  Cardiovascular:     Rate and Rhythm: Normal rate and regular rhythm.     Pulses: Intact distal pulses.     Heart sounds: S1 normal and S2 normal. Murmur heard.     Early systolic murmur is present with a grade of 2/6 at the upper right sternal border. No JVD, No leg edema  No gallop.  Pulmonary:     Effort: Pulmonary effort is normal. No accessory muscle usage or respiratory distress.     Breath sounds: Normal breath sounds.  Abdominal:     General: Bowel sounds are normal.     Palpations: Abdomen is soft.     Hernia: A hernia is present. Hernia is present in the ventral area (Small and reducible).  Musculoskeletal:     Right lower leg: No edema.     Left lower leg: No edema.    Laboratory examination:      Latest Ref Rng & Units 10/13/2022    8:16 AM 03/30/2022   10:37 AM 11/26/2021   10:21 AM  CMP  Glucose 70 - 99 mg/dL 161  096  045   BUN 8 - 27 mg/dL 14  15  16    Creatinine 0.76 - 1.27 mg/dL 4.09  8.11  9.14   Sodium 134 - 144 mmol/L 142  139  142   Potassium 3.5 - 5.2 mmol/L 4.2  4.3  4.3   Chloride 96 - 106 mmol/L 102  102  103   CO2 20 - 29 mmol/L 24   21  23    Calcium 8.6 - 10.2 mg/dL 9.9  9.9  9.7   Total Protein 6.0 - 8.5 g/dL 7.3  7.1  6.9   Total Bilirubin 0.0 - 1.2 mg/dL 1.1  1.1  0.9   Alkaline Phos 44 - 121 IU/L 86  80  73   AST 0 - 40 IU/L 18  18  18    ALT 0 - 44 IU/L 20  17  22        Latest Ref Rng & Units 03/30/2022   10:37 AM 11/26/2021   10:21 AM 12/22/2014    5:28 AM  CBC  WBC 3.4 - 10.8 x10E3/uL 6.3  6.1  7.5   Hemoglobin 13.0 - 17.7 g/dL 78.2  95.6  21.3   Hematocrit 37.5 - 51.0 % 44.9  45.0  32.9   Platelets 150 - 450 x10E3/uL 203  188  156    Lipid Panel     Component Value Date/Time   CHOL 150 10/13/2022 0816   TRIG 132 10/13/2022 0816   HDL 37 (L) 10/13/2022 0816   CHOLHDL 4.1 10/13/2022 0816   CHOLHDL 4.1 02/23/2013 0605   VLDL 53 (H) 02/23/2013 0605   LDLCALC 89 10/13/2022 0816   HEMOGLOBIN A1C Lab Results  Component Value Date   HGBA1C 7.4 (H) 10/13/2022   MPG 255 (H) 08/24/2014   TSH Recent Labs    03/30/22 1037  TSH 1.360     Medications and allergies  No Known Allergies   Current Outpatient Medications:    amLODipine (NORVASC) 10 MG tablet, TAKE 1 TABLET BY MOUTH DAILY, Disp: 90 tablet, Rfl: 1   aspirin EC 81 MG tablet, Take 81 mg by mouth daily., Disp: , Rfl:    atorvastatin (LIPITOR) 20 MG tablet, Take 1 tablet (20 mg total) by mouth daily., Disp: 90 tablet, Rfl: 3   cetirizine (ZYRTEC ALLERGY) 10 MG tablet, Take 1 tablet (10 mg total) by mouth at bedtime., Disp: 30 tablet, Rfl: 2   cloNIDine (CATAPRES - DOSED IN MG/24 HR) 0.2 mg/24hr patch, PLACE 1 PATCH ONTO THE SKIN ONCE A WEEK., Disp: 4 patch, Rfl: 12   fluticasone (FLONASE) 50 MCG/ACT nasal spray, Place 1 spray into both nostrils daily. Begin by using 2 sprays in each nare daily for 3 to 5 days, then decrease to  1 spray in each nare daily., Disp: 15.8 mL, Rfl: 2   labetalol (NORMODYNE) 200 MG tablet, TAKE 1 TABLET BY MOUTH 2 TIMES DAILY., Disp: 180 tablet, Rfl: 1   metFORMIN (GLUCOPHAGE) 1000 MG tablet, TAKE 1 TABLET BY MOUTH TWICE  A DAY, Disp: 180 tablet, Rfl: 0   Semaglutide, 2 MG/DOSE, (OZEMPIC, 2 MG/DOSE,) 8 MG/3ML SOPN, Inject 2 mg into the skin once a week., Disp: 3 mL, Rfl: 11   valsartan-hydrochlorothiazide (DIOVAN-HCT) 320-12.5 MG tablet, TAKE 1 TABLET BY MOUTH EVERY MORNING, Disp: 90 tablet, Rfl: 2   Vitamin D, Ergocalciferol, (DRISDOL) 1.25 MG (50000 UNIT) CAPS capsule, Take 50,000 Units by mouth once a week., Disp: , Rfl:     Radiology:   No results found.  Cardiac Studies:   Exercise Myoview stress test 03/04/2013: 1. Resting EKG showed normal sinus rhythm, poor R wave progression, pulmonary disease pattern.  Patient exercised on Bruce protocol for 4 minutes 31 sec. The maximum work level achieved was 7.3 MET's.  The test was terminated due to achievement of the target heart rate. 2. Perfusion imaging studies demonstrate mild diaphragmatic attenuation artifact in the inferior wall.  There was no evidence of ischemia or infarct. Dynamic gated images reveal normal wall motion and endocardial thickening. Left ventricular ejection fraction was estimated to be 58%. This is a low risk stress test.  Abdominal Aortic Duplex  04/17/2019:  The maximum aorta (sac) diameter is 2.23 cm (prox). Mixed atherosclerotic plaque. Normal flow velocities noted in the aorta. Iliac aretires could not be visualized due to body habitus.  No AAA noted.   Echocardiogram 12/17/2019:   Normal LV systolic function with EF 55%. Left ventricle cavity is normal in size. Moderate concentric hypertrophy of the left ventricle. Normal global wall motion. Normal diastolic filling pattern. Calculated EF 55%. Trileaflet aortic valve with no regurgitation. Mild aortic valve leaflet calcification. Mildly restricted aortic valve leaflets. Mild aortic valve stenosis. Aortic valve peak pressure gradient of 26 and mean gradient of  16.5 mmHg, calculated aortic valve area 1.3 cm. IVC is dilated with respiratory variation. This may suggest elevated right  heart pressure  Carotid artery duplex 12/27/2022: Duplex suggests stenosis in the right internal carotid artery (50-69%). No evidence of significant stenosis in the left carotid vessels. Antegrade right vertebral artery flow. Antegrade left vertebral artery flow. No significant change from 06/28/2022. Follow up in six months is appropriate if clinically indicated.  EKG    EKG 01/03/2023: Sinus rhythm with first-degree AV block at the rate of 80 bpm, left atrial enlargement, left axis deviation, left intrafascicular block.  Incomplete right bundle branch block.  No evidence of ischemia.  Compared to 12/08/2021, no significant change.   Assessment     ICD-10-CM   1. Asymptomatic stenosis of right carotid artery  I65.21 EKG 12-Lead    atorvastatin (LIPITOR) 20 MG tablet    PCV CAROTID DUPLEX (BILATERAL)    2. Essential hypertension  I10     3. Hypercholesteremia  E78.00 atorvastatin (LIPITOR) 20 MG tablet    4. Nonrheumatic aortic valve stenosis  I35.0 PCV ECHOCARDIOGRAM COMPLETE W BUBBLE      Meds ordered this encounter  Medications   atorvastatin (LIPITOR) 20 MG tablet    Sig: Take 1 tablet (20 mg total) by mouth daily.    Dispense:  90 tablet    Refill:  3    Discontinue Zocor    Medications Discontinued During This Encounter  Medication Reason   simvastatin (ZOCOR) 20 MG tablet  Change in therapy     Recommendations:   JB KODA  is a 77 y.o. Caucasian male with mild aortic stenosis, uncontrolled diabetes mellitus, moderate obesity, hypertension and hyperlipidemia presents here for annual visit for management of carotid stenosis and mild aortic stenosis.  1. Asymptomatic stenosis of right carotid artery Reviewed results of the carotid artery duplex, carotid artery stenosis has remained stable.  Will continue 6 monthly surveillance. - EKG 12-Lead - atorvastatin (LIPITOR) 20 MG tablet; Take 1 tablet (20 mg total) by mouth daily.  Dispense: 90 tablet; Refill: 3 - PCV  CAROTID DUPLEX (BILATERAL); Future  2. Essential hypertension Blood pressure is well-controlled, he is on labetalol and also on valsartan HCT, continue the same.  Renal function has remained stable and normal.  External labs reviewed.  3. Hypercholesteremia I reviewed his lipids, LDL is >70, in view of vascular disease, will discontinue simvastatin 20 mg and switch him to atorvastatin 20 mg daily, he has an appointment coming up to see his PCP for diabetes management in 2 months and he will obtain labs at that time.  I have communicated this to Ms. Saralyn Pilar, PA-C.  - atorvastatin (LIPITOR) 20 MG tablet; Take 1 tablet (20 mg total) by mouth daily.  Dispense: 90 tablet; Refill: 3  4. Nonrheumatic aortic valve stenosis Patient's last echocardiogram was in 2021, will repeat echocardiogram prior to his next office visit in 6 months.  I would like to see him back after the echocardiogram and carotid duplex.  I will also follow-up on the lipids.  - PCV ECHOCARDIOGRAM COMPLETE W BUBBLE; Future     Yates Decamp, MD, [facility] 01/03/2023, 1:47 PM Office: [phone] Pager: [phone]

## 2023-01-23 ENCOUNTER — Encounter: Payer: Self-pay | Admitting: Family Medicine

## 2023-01-23 ENCOUNTER — Other Ambulatory Visit: Payer: Self-pay | Admitting: *Deleted

## 2023-01-23 ENCOUNTER — Ambulatory Visit: Payer: Medicare PPO | Admitting: Family Medicine

## 2023-01-23 VITALS — BP 125/62 | HR 79 | Temp 97.7°F | Ht 71.0 in | Wt 216.0 lb

## 2023-01-23 DIAGNOSIS — E1165 Type 2 diabetes mellitus with hyperglycemia: Secondary | ICD-10-CM

## 2023-01-23 DIAGNOSIS — Z1159 Encounter for screening for other viral diseases: Secondary | ICD-10-CM

## 2023-01-23 DIAGNOSIS — I10 Essential (primary) hypertension: Secondary | ICD-10-CM | POA: Diagnosis not present

## 2023-01-23 DIAGNOSIS — Z7984 Long term (current) use of oral hypoglycemic drugs: Secondary | ICD-10-CM | POA: Diagnosis not present

## 2023-01-23 DIAGNOSIS — E782 Mixed hyperlipidemia: Secondary | ICD-10-CM | POA: Diagnosis not present

## 2023-01-23 LAB — POCT GLYCOSYLATED HEMOGLOBIN (HGB A1C): HbA1c POC (<> result, manual entry): 7.2 % (ref 4.0–5.6)

## 2023-01-23 NOTE — Assessment & Plan Note (Signed)
Stable. Continue amlodipine 10 mg daily, valsartan-HCTZ 320-12.5 mg, and labetalol 200 mg twice daily. Will continue to monitor.

## 2023-01-23 NOTE — Progress Notes (Signed)
Established Patient Office Visit  Subjective   Patient ID: Christopher Rasmussen, male    DOB: Aug 13, 1945  Age: 77 y.o. MRN: [id number]  Chief Complaint  Patient presents with   Diabetes    HPI Christopher Rasmussen is a 77 y.o. male presenting today for follow up of hypertension, hyperlipidemia, diabetes. Hypertension: Patient here for follow-up of elevated blood pressure.  Pt denies chest pain, SOB, dizziness, edema, syncope, fatigue or heart palpitations. Taking valsartan-HCTZ, amlodipine, labetalol, reports excellent compliance with treatment. Denies side effects. Hyperlipidemia: tolerating atorvastatin well with no myalgias or significant side effects.  At last appointment with PCP, increased simvastatin to 20 mg daily.  LDL remained above 70 at cardiology appointment, switched to atorvastatin 20 mg daily. The 10-year ASCVD risk score (Arnett DK, et al., 2019) is: 46.3% Diabetes: denies hypoglycemic events, wounds or sores that are not healing well, increased thirst or urination. Denies vision problems, eye exam due.Taking Ozempic and metformin as prescribed without any side effects.   ROS Negative unless otherwise noted in HPI   Objective:     BP 125/62   Pulse 79   Temp 97.7 F (36.5 C) (Oral)   Ht 5\' 11"  (1.803 m)   Wt 216 lb (98 kg)   SpO2 96%   BMI 30.13 kg/m   Physical Exam Constitutional:      General: He is not in acute distress.    Appearance: Normal appearance.  HENT:     Head: Normocephalic and atraumatic.  Cardiovascular:     Rate and Rhythm: Normal rate and regular rhythm.     Heart sounds: Murmur (Blowing systolic) heard.     No friction rub. No gallop.  Pulmonary:     Effort: Pulmonary effort is normal.     Breath sounds: Normal breath sounds. No wheezing, rhonchi or rales.  Skin:    General: Skin is warm and dry.  Neurological:     Mental Status: He is alert and oriented to person, place, and time.  Psychiatric:        Mood and Affect: Mood normal.     Results for orders placed or performed in visit on 01/23/23  POCT glycosylated hemoglobin (Hb A1C)  Result Value Ref Range   Hemoglobin A1C     HbA1c POC (<> result, manual entry) 7.2 4.0 - 5.6 %   HbA1c, POC (prediabetic range)     HbA1c, POC (controlled diabetic range)       Assessment & Plan:  Essential hypertension Assessment & Plan: Stable. Continue amlodipine 10 mg daily, valsartan-HCTZ 320-12.5 mg, and labetalol 200 mg twice daily. Will continue to monitor.  Orders: -     CBC with Differential/Platelet; Future -     Comprehensive metabolic panel; Future  Mixed hyperlipidemia Assessment & Plan: Repeating lipid panel today after switching simvastatin 20 mg daily to atorvastatin 20 mg daily with cardiology.  Will continue to monitor.  Orders: -     CBC with Differential/Platelet; Future -     Comprehensive metabolic panel; Future -     Lipid panel; Future  Type 2 diabetes mellitus with hyperglycemia, without long-term current use of insulin (HCC) Assessment & Plan: A1c improved from 7.4 to 7.2.  Continue Ozempic 2 mg weekly, metformin 1000 mg twice daily.  Follow-up in 3 months to recheck A1c.  Will continue to monitor.  Orders: -     CBC with Differential/Platelet; Future -     Comprehensive metabolic panel; Future -     POCT  glycosylated hemoglobin (Hb A1C)    Return in about 3 months (around 04/25/2023) for follow-up for HTN, HLD, DM, fasting blood work 1 week before.    Melida Quitter, PA

## 2023-01-23 NOTE — Patient Instructions (Addendum)
Your A1c has decreased from 7.4 to 7.2 which is fantastic!  We would like your A1c ideally to be less than 7.0, so we are on the way there.  PREVENTATIVE CARE: -At the pharmacy, ask for the tetanus booster (Tdap)

## 2023-01-23 NOTE — Assessment & Plan Note (Signed)
Repeating lipid panel today after switching simvastatin 20 mg daily to atorvastatin 20 mg daily with cardiology.  Will continue to monitor.

## 2023-01-23 NOTE — Assessment & Plan Note (Signed)
A1c improved from 7.4 to 7.2.  Continue Ozempic 2 mg weekly, metformin 1000 mg twice daily.  Follow-up in 3 months to recheck A1c.  Will continue to monitor.

## 2023-01-24 LAB — CBC WITH DIFFERENTIAL/PLATELET
Basophils Absolute: 0.1 10*3/uL (ref 0.0–0.2)
Basos: 1 %
EOS (ABSOLUTE): 0.6 10*3/uL — ABNORMAL HIGH (ref 0.0–0.4)
Eos: 8 %
Hematocrit: 46.9 % (ref 37.5–51.0)
Hemoglobin: 15.7 g/dL (ref 13.0–17.7)
Immature Grans (Abs): 0 10*3/uL (ref 0.0–0.1)
Immature Granulocytes: 0 %
Lymphocytes Absolute: 1.7 10*3/uL (ref 0.7–3.1)
Lymphs: 24 %
MCH: 30.2 pg (ref 26.6–33.0)
MCHC: 33.5 g/dL (ref 31.5–35.7)
MCV: 90 fL (ref 79–97)
Monocytes Absolute: 0.5 10*3/uL (ref 0.1–0.9)
Monocytes: 7 %
Neutrophils Absolute: 4.4 10*3/uL (ref 1.4–7.0)
Neutrophils: 60 %
Platelets: 191 10*3/uL (ref 150–450)
RBC: 5.2 x10E6/uL (ref 4.14–5.80)
RDW: 13.4 % (ref 11.6–15.4)
WBC: 7.3 10*3/uL (ref 3.4–10.8)

## 2023-01-24 LAB — LIPID PANEL
Chol/HDL Ratio: 4 ratio (ref 0.0–5.0)
Cholesterol, Total: 128 mg/dL (ref 100–199)
HDL: 32 mg/dL — ABNORMAL LOW (ref >39)
LDL Chol Calc (NIH): 65 mg/dL (ref 0–99)
Triglycerides: 182 mg/dL — ABNORMAL HIGH (ref 0–149)
VLDL Cholesterol Cal: 31 mg/dL (ref 5–40)

## 2023-01-24 LAB — COMPREHENSIVE METABOLIC PANEL
ALT: 21 IU/L (ref 0–44)
AST: 19 IU/L (ref 0–40)
Albumin: 4.6 g/dL (ref 3.8–4.8)
Alkaline Phosphatase: 90 IU/L (ref 44–121)
BUN/Creatinine Ratio: 19 (ref 10–24)
BUN: 15 mg/dL (ref 8–27)
Bilirubin Total: 0.8 mg/dL (ref 0.0–1.2)
CO2: 23 mmol/L (ref 20–29)
Calcium: 10.2 mg/dL (ref 8.6–10.2)
Chloride: 101 mmol/L (ref 96–106)
Creatinine, Ser: 0.81 mg/dL (ref 0.76–1.27)
Globulin, Total: 2.7 g/dL (ref 1.5–4.5)
Glucose: 159 mg/dL — ABNORMAL HIGH (ref 70–99)
Potassium: 4.3 mmol/L (ref 3.5–5.2)
Sodium: 141 mmol/L (ref 134–144)
Total Protein: 7.3 g/dL (ref 6.0–8.5)
eGFR: 92 mL/min/{1.73_m2} (ref >59)

## 2023-01-24 LAB — HEPATITIS C ANTIBODY: Hep C Virus Ab: NONREACTIVE

## 2023-01-30 ENCOUNTER — Other Ambulatory Visit: Payer: Self-pay | Admitting: Cardiology

## 2023-01-30 DIAGNOSIS — I10 Essential (primary) hypertension: Secondary | ICD-10-CM

## 2023-02-07 ENCOUNTER — Other Ambulatory Visit: Payer: Self-pay | Admitting: Family Medicine

## 2023-04-12 ENCOUNTER — Other Ambulatory Visit: Payer: Self-pay

## 2023-04-12 DIAGNOSIS — E1165 Type 2 diabetes mellitus with hyperglycemia: Secondary | ICD-10-CM

## 2023-04-12 DIAGNOSIS — I10 Essential (primary) hypertension: Secondary | ICD-10-CM

## 2023-04-12 DIAGNOSIS — E782 Mixed hyperlipidemia: Secondary | ICD-10-CM

## 2023-04-18 ENCOUNTER — Other Ambulatory Visit: Payer: Medicare PPO

## 2023-04-18 DIAGNOSIS — E782 Mixed hyperlipidemia: Secondary | ICD-10-CM | POA: Diagnosis not present

## 2023-04-18 DIAGNOSIS — I10 Essential (primary) hypertension: Secondary | ICD-10-CM | POA: Diagnosis not present

## 2023-04-18 DIAGNOSIS — E1165 Type 2 diabetes mellitus with hyperglycemia: Secondary | ICD-10-CM

## 2023-04-19 LAB — COMPREHENSIVE METABOLIC PANEL
ALT: 27 IU/L (ref 0–44)
AST: 20 IU/L (ref 0–40)
Albumin: 4.2 g/dL (ref 3.8–4.8)
Alkaline Phosphatase: 85 IU/L (ref 44–121)
BUN/Creatinine Ratio: 19 (ref 10–24)
BUN: 15 mg/dL (ref 8–27)
Bilirubin Total: 1 mg/dL (ref 0.0–1.2)
CO2: 24 mmol/L (ref 20–29)
Calcium: 9.6 mg/dL (ref 8.6–10.2)
Chloride: 104 mmol/L (ref 96–106)
Creatinine, Ser: 0.8 mg/dL (ref 0.76–1.27)
Globulin, Total: 2.2 g/dL (ref 1.5–4.5)
Glucose: 173 mg/dL — ABNORMAL HIGH (ref 70–99)
Potassium: 4.5 mmol/L (ref 3.5–5.2)
Sodium: 141 mmol/L (ref 134–144)
Total Protein: 6.4 g/dL (ref 6.0–8.5)
eGFR: 92 mL/min/{1.73_m2} (ref >59)

## 2023-04-19 LAB — LIPID PANEL
Chol/HDL Ratio: 3.7 ratio (ref 0.0–5.0)
Cholesterol, Total: 117 mg/dL (ref 100–199)
HDL: 32 mg/dL — ABNORMAL LOW (ref >39)
LDL Chol Calc (NIH): 57 mg/dL (ref 0–99)
Triglycerides: 163 mg/dL — ABNORMAL HIGH (ref 0–149)
VLDL Cholesterol Cal: 28 mg/dL (ref 5–40)

## 2023-04-19 LAB — CBC WITH DIFFERENTIAL/PLATELET
Basophils Absolute: 0.1 10*3/uL (ref 0.0–0.2)
Basos: 1 %
EOS (ABSOLUTE): 0.5 10*3/uL — ABNORMAL HIGH (ref 0.0–0.4)
Eos: 6 %
Hematocrit: 44.6 % (ref 37.5–51.0)
Hemoglobin: 14.5 g/dL (ref 13.0–17.7)
Immature Grans (Abs): 0 10*3/uL (ref 0.0–0.1)
Immature Granulocytes: 0 %
Lymphocytes Absolute: 2 10*3/uL (ref 0.7–3.1)
Lymphs: 25 %
MCH: 30.3 pg (ref 26.6–33.0)
MCHC: 32.5 g/dL (ref 31.5–35.7)
MCV: 93 fL (ref 79–97)
Monocytes Absolute: 0.6 10*3/uL (ref 0.1–0.9)
Monocytes: 8 %
Neutrophils Absolute: 4.9 10*3/uL (ref 1.4–7.0)
Neutrophils: 60 %
Platelets: 193 10*3/uL (ref 150–450)
RBC: 4.79 x10E6/uL (ref 4.14–5.80)
RDW: 13.2 % (ref 11.6–15.4)
WBC: 8 10*3/uL (ref 3.4–10.8)

## 2023-04-24 ENCOUNTER — Other Ambulatory Visit: Payer: Self-pay | Admitting: Nurse Practitioner

## 2023-04-25 ENCOUNTER — Ambulatory Visit: Payer: Medicare PPO | Admitting: Family Medicine

## 2023-05-17 ENCOUNTER — Encounter: Payer: Self-pay | Admitting: Family Medicine

## 2023-05-17 ENCOUNTER — Ambulatory Visit: Payer: Medicare PPO | Admitting: Family Medicine

## 2023-05-17 VITALS — BP 103/59 | HR 85 | Resp 18 | Ht 71.0 in | Wt 213.0 lb

## 2023-05-17 DIAGNOSIS — E1165 Type 2 diabetes mellitus with hyperglycemia: Secondary | ICD-10-CM | POA: Diagnosis not present

## 2023-05-17 DIAGNOSIS — I10 Essential (primary) hypertension: Secondary | ICD-10-CM | POA: Diagnosis not present

## 2023-05-17 DIAGNOSIS — E1169 Type 2 diabetes mellitus with other specified complication: Secondary | ICD-10-CM | POA: Diagnosis not present

## 2023-05-17 DIAGNOSIS — E785 Hyperlipidemia, unspecified: Secondary | ICD-10-CM | POA: Diagnosis not present

## 2023-05-17 LAB — POCT GLYCOSYLATED HEMOGLOBIN (HGB A1C): HbA1c POC (<> result, manual entry): 6.6 % (ref 4.0–5.6)

## 2023-05-17 NOTE — Assessment & Plan Note (Addendum)
BP goal <130/80.  Stable, at goal.  Soft blood pressure today, encourage adequate hydration and ambulatory blood pressure monitoring.  He will alert me if blood pressure is consistently less than 110 systolic or less than 60 diastolic.  Continue amlodipine 10 mg daily, valsartan-HCTZ 320-12.5 mg, and labetalol 200 mg twice daily.  CMP within normal limits.  Will continue to monitor.

## 2023-05-17 NOTE — Progress Notes (Signed)
Established Patient Office Visit  Subjective   Patient ID: Christopher Rasmussen, male    DOB: 01/09/1946  Age: 77 y.o. MRN: [id number]  Chief Complaint  Patient presents with   Diabetes   Hyperlipidemia   Hypertension    HPI Christopher Rasmussen is a 77 y.o. male presenting today for follow up of hypertension, hyperlipidemia, diabetes. Hypertension:  Pt denies chest pain, SOB, dizziness, edema, syncope, fatigue or heart palpitations. Taking labetalol, amlodipine, valsartan-hydrochlorothiazide, reports excellent compliance with treatment. Denies side effects.  He does not check his blood pressure at home Hyperlipidemia: tolerating atorvastatin 20 mg daily well with no myalgias or significant side effects.  The ASCVD Risk score (Arnett DK, et al., 2019) failed to calculate for the following reasons:   The valid total cholesterol range is 130 to 320 mg/dL Diabetes: denies hypoglycemic events, wounds or sores that are not healing well, increased thirst or urination. Denies vision problems, eye exam completed.  Taking Ozempic and metformin as prescribed without any side effects.  Outpatient Medications Prior to Visit  Medication Sig   amLODipine (NORVASC) 10 MG tablet TAKE 1 TABLET BY MOUTH DAILY   aspirin EC 81 MG tablet Take 81 mg by mouth daily.   Cholecalciferol (VITAMIN D) 50 MCG (2000 UT) tablet Take 2,000 Units by mouth daily. One Tablet daily   cloNIDine (CATAPRES - DOSED IN MG/24 HR) 0.2 mg/24hr patch PLACE 1 PATCH ONTO THE SKIN ONCE A WEEK.   fluticasone (FLONASE) 50 MCG/ACT nasal spray Place 1 spray into both nostrils daily. Begin by using 2 sprays in each nare daily for 3 to 5 days, then decrease to 1 spray in each nare daily.   labetalol (NORMODYNE) 200 MG tablet TAKE 1 TABLET BY MOUTH 2 TIMES DAILY.   metFORMIN (GLUCOPHAGE) 1000 MG tablet TAKE 1 TABLET BY MOUTH TWICE A DAY   Semaglutide, 2 MG/DOSE, (OZEMPIC, 2 MG/DOSE,) 8 MG/3ML SOPN Inject 2 mg into the skin once a week.    valsartan-hydrochlorothiazide (DIOVAN-HCT) 320-12.5 MG tablet TAKE 1 TABLET BY MOUTH EVERY MORNING   atorvastatin (LIPITOR) 20 MG tablet Take 1 tablet (20 mg total) by mouth daily.   cetirizine (ZYRTEC ALLERGY) 10 MG tablet Take 1 tablet (10 mg total) by mouth at bedtime.   No facility-administered medications prior to visit.    ROS Negative unless otherwise noted in HPI   Objective:     BP (!) 103/59 (BP Location: Left Arm, Patient Position: Sitting, Cuff Size: Normal)   Pulse 85   Resp 18   Ht 5\' 11"  (1.803 m)   Wt 213 lb (96.6 kg)   SpO2 95%   BMI 29.71 kg/m   Physical Exam Constitutional:      General: He is not in acute distress.    Appearance: Normal appearance.  HENT:     Head: Normocephalic and atraumatic.  Cardiovascular:     Rate and Rhythm: Normal rate and regular rhythm.     Heart sounds: Murmur (blowing systolic) heard.     No friction rub. No gallop.  Pulmonary:     Effort: Pulmonary effort is normal. No respiratory distress.     Breath sounds: Normal breath sounds. No wheezing, rhonchi or rales.  Skin:    General: Skin is warm and dry.  Neurological:     Mental Status: He is alert and oriented to person, place, and time.  Psychiatric:        Mood and Affect: Mood normal.    Diabetic Foot Exam -  Simple   Simple Foot Form Diabetic Foot exam was performed with the following findings: Yes 05/17/2023  2:55 PM  Visual Inspection No deformities, no ulcerations, no other skin breakdown bilaterally: Yes Sensation Testing Intact to touch and monofilament testing bilaterally: Yes Pulse Check Posterior Tibialis and Dorsalis pulse intact bilaterally: Yes Comments     Assessment & Plan:  Essential hypertension Assessment & Plan: BP goal <130/80.  Stable, at goal.  Soft blood pressure today, encourage adequate hydration and ambulatory blood pressure monitoring.  He will alert me if blood pressure is consistently less than 110 systolic or less than 60  diastolic.  Continue amlodipine 10 mg daily, valsartan-HCTZ 320-12.5 mg, and labetalol 200 mg twice daily.  CMP within normal limits.  Will continue to monitor.   Hyperlipidemia associated with type 2 diabetes mellitus (HCC) Assessment & Plan: Last lipid panel: LDL 57, HDL 32, triglycerides 163.  Triglycerides still elevated but have improved from 182.  Continue atorvastatin 20 mg daily.  Will continue to monitor.   Type 2 diabetes mellitus with hyperglycemia, without long-term current use of insulin (HCC) Assessment & Plan: A1c further improved to 6.2.  Continue Ozempic 2 mg weekly, metformin 1000 mg twice daily.  Will continue to monitor.  Orders: -     POCT glycosylated hemoglobin (Hb A1C)    Return in about 3 months (around 08/17/2023) for follow-up for HTN, HLD, DM, POC A1C at visit.    Melida Quitter, PA

## 2023-05-17 NOTE — Assessment & Plan Note (Signed)
Last lipid panel: LDL 57, HDL 32, triglycerides 163.  Triglycerides still elevated but have improved from 182.  Continue atorvastatin 20 mg daily.  Will continue to monitor.

## 2023-05-17 NOTE — Assessment & Plan Note (Signed)
A1c further improved to 6.2.  Continue Ozempic 2 mg weekly, metformin 1000 mg twice daily.  Will continue to monitor.

## 2023-05-17 NOTE — Patient Instructions (Addendum)
Your blood sugar levels are fantastic, so keep up the good work!  Check your blood pressure periodically.  If it is consistently less than 110 on the top or less than 60 on the bottom, please let me know.  You are also due for the following recommendations:  PREVENTATIVE CARE: -Colon cancer is the #2 because of cancer related deaths, but it is also one of the most preventable cancers!  It is recommended for everyone starting at age 76 through the age of 31 in order to catch precancer before it progresses to colon cancer.  Since you are 4, this will be the last year that insurance will pay for this screening.  This can be done with either a colonoscopy or the Cologuard.  Please let me know if you are interested in either of them. -The tetanus booster is recommended for all adults every 10 years to prevent a tetanus infection which everyone is at risk for with any open wounds or cuts in the skin. -The pneumonia vaccine is recommended for everyone starting at age 32 to decrease the risk of pneumonia and complications or hospitalizations from pneumonia. -The shingles vaccine is a 2 dose series that protects you from getting shingles or any nerve related complications from shingles.

## 2023-06-14 ENCOUNTER — Other Ambulatory Visit: Payer: Self-pay | Admitting: Cardiology

## 2023-07-10 ENCOUNTER — Other Ambulatory Visit: Payer: Medicare PPO

## 2023-07-10 ENCOUNTER — Ambulatory Visit (HOSPITAL_COMMUNITY)
Admission: RE | Admit: 2023-07-10 | Discharge: 2023-07-10 | Disposition: A | Discharge status: Home / Self Care | Payer: Medicare PPO | Source: Ambulatory Visit | Attending: Cardiology | Admitting: Cardiology

## 2023-07-10 ENCOUNTER — Ambulatory Visit (HOSPITAL_BASED_OUTPATIENT_CLINIC_OR_DEPARTMENT_OTHER): Payer: Medicare PPO

## 2023-07-10 DIAGNOSIS — I350 Nonrheumatic aortic (valve) stenosis: Secondary | ICD-10-CM | POA: Diagnosis not present

## 2023-07-10 DIAGNOSIS — I6521 Occlusion and stenosis of right carotid artery: Secondary | ICD-10-CM | POA: Diagnosis not present

## 2023-07-10 LAB — ECHOCARDIOGRAM COMPLETE BUBBLE STUDY
AR max vel: 0.81 cm2
AV Area VTI: 0.72 cm2
AV Area mean vel: 0.77 cm2
AV Mean grad: 28.5 mm[Hg]
AV Peak grad: 48.9 mm[Hg]
Ao pk vel: 3.5 m/s
Area-P 1/2: 3.36 cm2
S' Lateral: 3.35 cm

## 2023-07-10 NOTE — Progress Notes (Signed)
Very mild disease, no further testing needed  Carotid artery duplex 07/10/2023: Right Carotid: Velocities in the right ICA are consistent with a 1-39% stenosis.  Left Carotid: The extracranial vessels were near-normal with only minimal wall thickening or plaque.  Vertebrals:  Bilateral vertebral arteries demonstrate antegrade flow. Subclavians: Normal flow hemodynamics were seen in bilateral subclavian arteries.

## 2023-07-10 NOTE — Progress Notes (Signed)
Heart function is normal.  Your aortic stenosis has mildly progressed compared to prior echocardiogram.  Otherwise echocardiogram appears to be stable.  No indication for any surgical invasive procedures.  Will discuss more on the office visit.  ECHOCARDIOGRAM COMPLETE BUBBLE STUDY 07/10/2023 1. Left ventricular ejection fraction, by estimation, is 60 to 65%. The left ventricle has normal function. The left ventricle has no regional wall motion abnormalities. There is mild concentric left ventricular hypertrophy. Left ventricular diastolic parameters are consistent with Grade I diastolic dysfunction (impaired relaxation). 2. Right ventricular systolic function is normal. The right ventricular size is normal. 3. Left atrial size was mildly dilated. 4. The mitral valve is normal in structure. Trivial mitral valve regurgitation. No evidence of mitral stenosis. 5. The aortic valve is tricuspid. There is moderate calcification of the aortic valve. Aortic valve regurgitation is not visualized. Moderate to severe aortic valve stenosis. Aortic valve area, by VTI measures 0.72 cm. Aortic valve mean gradient measures 28.5 mmHg. Aortic valve Vmax measures 3.50 m/s. 6. The inferior vena cava is normal in size with greater than 50% respiratory variability, suggesting right atrial pressure of 3 mmHg.

## 2023-07-17 ENCOUNTER — Ambulatory Visit: Payer: Self-pay | Admitting: Cardiology

## 2023-07-26 ENCOUNTER — Other Ambulatory Visit: Payer: Self-pay | Admitting: Cardiology

## 2023-07-26 DIAGNOSIS — I10 Essential (primary) hypertension: Secondary | ICD-10-CM

## 2023-08-04 ENCOUNTER — Other Ambulatory Visit: Payer: Self-pay | Admitting: Family Medicine

## 2023-08-17 ENCOUNTER — Encounter: Payer: Self-pay | Admitting: Family Medicine

## 2023-08-17 ENCOUNTER — Ambulatory Visit: Payer: Medicare PPO | Admitting: Family Medicine

## 2023-08-17 VITALS — BP 107/58 | HR 76 | Resp 18 | Ht 71.0 in | Wt 219.0 lb

## 2023-08-17 DIAGNOSIS — E1169 Type 2 diabetes mellitus with other specified complication: Secondary | ICD-10-CM | POA: Diagnosis not present

## 2023-08-17 DIAGNOSIS — E1165 Type 2 diabetes mellitus with hyperglycemia: Secondary | ICD-10-CM

## 2023-08-17 DIAGNOSIS — I152 Hypertension secondary to endocrine disorders: Secondary | ICD-10-CM | POA: Diagnosis not present

## 2023-08-17 DIAGNOSIS — Z7985 Long-term (current) use of injectable non-insulin antidiabetic drugs: Secondary | ICD-10-CM | POA: Diagnosis not present

## 2023-08-17 DIAGNOSIS — E1159 Type 2 diabetes mellitus with other circulatory complications: Secondary | ICD-10-CM

## 2023-08-17 DIAGNOSIS — Z23 Encounter for immunization: Secondary | ICD-10-CM

## 2023-08-17 DIAGNOSIS — E785 Hyperlipidemia, unspecified: Secondary | ICD-10-CM | POA: Diagnosis not present

## 2023-08-17 LAB — POCT GLYCOSYLATED HEMOGLOBIN (HGB A1C): Hemoglobin A1C: 7.3 % — AB (ref 4.0–5.6)

## 2023-08-17 MED ORDER — AMLODIPINE BESYLATE 10 MG PO TABS
10.0000 mg | ORAL_TABLET | Freq: Every day | ORAL | 1 refills | Status: DC
Start: 2023-08-17 — End: 2024-04-10

## 2023-08-17 MED ORDER — METFORMIN HCL 1000 MG PO TABS: 1000.0000 mg | ORAL_TABLET | Freq: Two times a day (BID) | ORAL | 1 refills | Status: DC

## 2023-08-17 NOTE — Progress Notes (Signed)
 Established Patient Office Visit  Subjective   Patient ID: Christopher Rasmussen, male    DOB: 21-Aug-1945  Age: 79 y.o. MRN: [id number]  Chief Complaint  Patient presents with   Hypertension    HPI Christopher Rasmussen is a 78 y.o. male presenting today for follow up of hypertension, hyperlipidemia, diabetes. Hypertension: Patient here for follow-up of elevated blood pressure.  He is also followed by cardiology.  Pt denies chest pain, SOB, dizziness, edema, syncope, fatigue or heart palpitations. Taking labetalol , amlodipine , valsartan-hydrochlorothiazide , reports excellent compliance with treatment. Denies side effects. Diabetes: denies hypoglycemic events, wounds or sores that are not healing well, increased thirst or urination. Denies vision problems, eye exam completed February 2024, scheduled again for February 2025 with Dr. Regenia. Taking Ozempic  and metformin  as prescribed without any side effects.   Outpatient Medications Prior to Visit  Medication Sig   aspirin  EC 81 MG tablet Take 81 mg by mouth daily.   atorvastatin  (LIPITOR) 20 MG tablet Take 20 mg by mouth daily.   Cholecalciferol (VITAMIN D ) 50 MCG (2000 UT) tablet Take 2,000 Units by mouth daily. One Tablet daily   cloNIDine  (CATAPRES  - DOSED IN MG/24 HR) 0.2 mg/24hr patch Place 0.2 mg onto the skin once a week.   labetalol  (NORMODYNE ) 200 MG tablet Take 200 mg by mouth 2 (two) times daily.   Semaglutide , 2 MG/DOSE, (OZEMPIC , 2 MG/DOSE,) 8 MG/3ML SOPN Inject 2 mg into the skin once a week.   valsartan-hydrochlorothiazide  (DIOVAN-HCT) 320-12.5 MG tablet Take 1 tablet by mouth daily.   [DISCONTINUED] amLODipine  (NORVASC ) 10 MG tablet TAKE 1 TABLET BY MOUTH DAILY   [DISCONTINUED] cloNIDine  (CATAPRES  - DOSED IN MG/24 HR) 0.2 mg/24hr patch PLACE 1 PATCH ONTO THE SKIN ONCE A WEEK.   [DISCONTINUED] labetalol  (NORMODYNE ) 200 MG tablet TAKE 1 TABLET BY MOUTH 2 TIMES DAILY.   [DISCONTINUED] metFORMIN  (GLUCOPHAGE ) 1000 MG tablet TAKE 1 TABLET BY  MOUTH TWICE A DAY   [DISCONTINUED] valsartan-hydrochlorothiazide  (DIOVAN-HCT) 320-12.5 MG tablet TAKE 1 TABLET BY MOUTH EVERY MORNING   [DISCONTINUED] atorvastatin  (LIPITOR) 20 MG tablet Take 1 tablet (20 mg total) by mouth daily.   [DISCONTINUED] cetirizine  (ZYRTEC  ALLERGY) 10 MG tablet Take 1 tablet (10 mg total) by mouth at bedtime.   [DISCONTINUED] fluticasone  (FLONASE ) 50 MCG/ACT nasal spray Place 1 spray into both nostrils daily. Begin by using 2 sprays in each nare daily for 3 to 5 days, then decrease to 1 spray in each nare daily.   No facility-administered medications prior to visit.    ROS Negative unless otherwise noted in HPI   Objective:     BP (!) 107/58 (BP Location: Left Arm, Patient Position: Sitting, Cuff Size: Large)   Pulse 76   Resp 18   Ht 5' 11 (1.803 m)   Wt 219 lb (99.3 kg)   SpO2 96%   BMI 30.54 kg/m   Physical Exam Constitutional:      General: He is not in acute distress.    Appearance: Normal appearance.  HENT:     Head: Normocephalic and atraumatic.  Cardiovascular:     Rate and Rhythm: Normal rate and regular rhythm.     Heart sounds: Murmur heard.     No friction rub. No gallop.  Pulmonary:     Effort: Pulmonary effort is normal. No respiratory distress.     Breath sounds: Normal breath sounds. No wheezing, rhonchi or rales.  Skin:    General: Skin is warm and dry.  Neurological:  Mental Status: He is alert and oriented to person, place, and time.  Psychiatric:        Mood and Affect: Mood normal.    Results for orders placed or performed in visit on 08/17/23  POCT HgB A1C  Result Value Ref Range   Hemoglobin A1C 7.3 (A) 4.0 - 5.6 %   HbA1c POC (<> result, manual entry)     HbA1c, POC (prediabetic range)     HbA1c, POC (controlled diabetic range)       Assessment & Plan:  Type 2 diabetes mellitus with hyperglycemia, without long-term current use of insulin  (HCC) Assessment & Plan: A1c increased to 7.3 from October.  Likely  related to holiday season.  Continue Ozempic  2 mg weekly, metformin  1000 mg twice daily.  Encouraged to return to typical physical activity and low-carb/glucose nutrition.  Will continue to monitor.  Orders: -     POCT glycosylated hemoglobin (Hb A1C) -     metFORMIN  HCl; Take 1 tablet (1,000 mg total) by mouth 2 (two) times daily.  Dispense: 180 tablet; Refill: 1  Hypertension associated with diabetes (HCC) Assessment & Plan: BP goal <130/80.  Stable, at goal.  Soft blood pressure today, encourage adequate hydration and ambulatory blood pressure monitoring.  Contacting cardiologist to discuss soft blood pressures.  Appointment with cardiologist is also scheduled for about 1 month from today.  Until then, continue amlodipine  10 mg daily, valsartan-HCTZ 320-12.5 mg, and labetalol  200 mg twice daily. Will continue to monitor.  Orders: -     amLODIPine  Besylate; Take 1 tablet (10 mg total) by mouth daily.  Dispense: 90 tablet; Refill: 1  Need for pneumococcal 20-valent conjugate vaccination -     Pneumococcal conjugate vaccine 20-valent  Hyperlipidemia associated with type 2 diabetes mellitus (HCC)  Agreeable to PCV 20 today.  Also recommended to update tetanus booster at pharmacy per Medicare requirements.  Return in about 3 months (around 11/15/2023) for follow-up for HTN, HLD, DM, fasting blood work 1 week before.    Joesph DELENA Sear, PA

## 2023-08-17 NOTE — Assessment & Plan Note (Addendum)
 BP goal <130/80.  Stable, at goal.  Soft blood pressure today, encourage adequate hydration and ambulatory blood pressure monitoring.  Contacting cardiologist to discuss soft blood pressures.  Appointment with cardiologist is also scheduled for about 1 month from today.  Until then, continue amlodipine  10 mg daily, valsartan-HCTZ 320-12.5 mg, and labetalol  200 mg twice daily. Will continue to monitor.

## 2023-08-17 NOTE — Assessment & Plan Note (Signed)
 A1c increased to 7.3 from October.  Likely related to holiday season.  Continue Ozempic 2 mg weekly, metformin 1000 mg twice daily.  Encouraged to return to typical physical activity and low-carb/glucose nutrition.  Will continue to monitor.

## 2023-08-17 NOTE — Patient Instructions (Signed)
 At the pharmacy, please update your tetanus booster to protect yourself!

## 2023-09-12 ENCOUNTER — Encounter: Payer: Self-pay | Admitting: Cardiology

## 2023-09-12 ENCOUNTER — Other Ambulatory Visit: Payer: Self-pay | Admitting: Cardiology

## 2023-09-12 ENCOUNTER — Other Ambulatory Visit: Payer: Self-pay | Admitting: Family Medicine

## 2023-09-12 ENCOUNTER — Ambulatory Visit: Payer: Medicare PPO | Attending: Cardiology | Admitting: Cardiology

## 2023-09-12 VITALS — BP 113/58 | HR 85 | Resp 16 | Ht 71.0 in | Wt 221.6 lb

## 2023-09-12 DIAGNOSIS — I350 Nonrheumatic aortic (valve) stenosis: Secondary | ICD-10-CM | POA: Diagnosis not present

## 2023-09-12 DIAGNOSIS — I10 Essential (primary) hypertension: Secondary | ICD-10-CM

## 2023-09-12 DIAGNOSIS — I6521 Occlusion and stenosis of right carotid artery: Secondary | ICD-10-CM

## 2023-09-12 DIAGNOSIS — E1165 Type 2 diabetes mellitus with hyperglycemia: Secondary | ICD-10-CM

## 2023-09-12 DIAGNOSIS — E1159 Type 2 diabetes mellitus with other circulatory complications: Secondary | ICD-10-CM

## 2023-09-12 NOTE — Progress Notes (Signed)
 Cardiology Office Note:  .   Date:  09/12/2023  ID:  KHALEEL BECKOM, DOB 1945-12-12, MRN [id number] PCP: Wallace Joesph LABOR, PA  [facility] Providers Cardiologist:  Gordy Bergamo, MD   History of Present Illness: .   Max B Musick is a 78 y.o. Caucasian male with mild aortic stenosis, uncontrolled diabetes mellitus, moderate obesity, hypertension and hyperlipidemia presents here for annual visit for management of carotid stenosis and mild aortic stenosis.   Discussed the use of AI scribe software for clinical note transcription with the patient, who gave verbal consent to proceed.  History of Present Illness   ADONNIS SALCEDA is a 78 year old male with aortic stenosis and carotid stenosis who presents for a cardiology follow-up.  He has aortic stenosis with no new symptoms such as shortness of breath, chest pain, or syncope. He recently underwent an echocardiogram to monitor the condition.  He has carotid stenosis and recently had a carotid duplex. His cholesterol is well controlled with atorvastatin , resulting in an LDL of 57.  His blood pressure is well managed with amlodipine , valsartan HCT, and labetalol , with a current reading of 113/58. He takes atorvastatin  20 mg daily for cholesterol management.  He weighs 221 pounds, a slight increase over the holidays but a decrease from 242 pounds five years ago. He attributes some weight loss to Ozempic , which he has been taking at the maximum dose since November. He lives with his son, daughter-in-law, and three grandchildren, and is active with them, particularly the youngest grandchild.        Labs   Lab Results  Component Value Date   CHOL 117 04/18/2023   HDL 32 (L) 04/18/2023   LDLCALC 57 04/18/2023   TRIG 163 (H) 04/18/2023   CHOLHDL 3.7 04/18/2023   Lab Results  Component Value Date   NA 141 04/18/2023   K 4.5 04/18/2023   CO2 24 04/18/2023   GLUCOSE 173 (H) 04/18/2023   BUN 15 04/18/2023   CREATININE 0.80 04/18/2023    CALCIUM  9.6 04/18/2023   EGFR 92 04/18/2023   GFRNONAA >60 12/22/2014      Latest Ref Rng & Units 04/18/2023    8:58 AM 01/23/2023    9:41 AM 10/13/2022    8:16 AM  BMP  Glucose 70 - 99 mg/dL 826  840  820   BUN 8 - 27 mg/dL 15  15  14    Creatinine 0.76 - 1.27 mg/dL 9.19  9.18  9.30   BUN/Creat Ratio 10 - 24 19  19  20    Sodium 134 - 144 mmol/L 141  141  142   Potassium 3.5 - 5.2 mmol/L 4.5  4.3  4.2   Chloride 96 - 106 mmol/L 104  101  102   CO2 20 - 29 mmol/L 24  23  24    Calcium  8.6 - 10.2 mg/dL 9.6  89.7  9.9       Latest Ref Rng & Units 04/18/2023    8:58 AM 01/23/2023    9:41 AM 03/30/2022   10:37 AM  CBC  WBC 3.4 - 10.8 x10E3/uL 8.0  7.3  6.3   Hemoglobin 13.0 - 17.7 g/dL 85.4  84.2  84.4   Hematocrit 37.5 - 51.0 % 44.6  46.9  44.9   Platelets 150 - 450 x10E3/uL 193  191  203    Lab Results  Component Value Date   HGBA1C 7.3 (A) 08/17/2023    Lab Results  Component Value Date  TSH 1.360 03/30/2022    Review of Systems  Cardiovascular:  Negative for chest pain, dyspnea on exertion and leg swelling.    Physical Exam:   VS:  BP (!) 113/58 (BP Location: Right Arm, Patient Position: Sitting, Cuff Size: Normal)   Pulse 85   Resp 16   Ht 5' 11 (1.803 m)   Wt 221 lb 9.6 oz (100.5 kg)   SpO2 97%   BMI 30.91 kg/m    Wt Readings from Last 3 Encounters:  09/12/23 221 lb 9.6 oz (100.5 kg)  08/17/23 219 lb (99.3 kg)  05/17/23 213 lb (96.6 kg)    Physical Exam Constitutional:      Appearance: He is obese.  Neck:     Vascular: Carotid bruit (bilateral) present. No JVD.  Cardiovascular:     Rate and Rhythm: Normal rate and regular rhythm.     Pulses: Intact distal pulses.     Heart sounds: S1 normal and S2 normal. Murmur heard.     Harsh midsystolic murmur is present with a grade of 2/6 at the upper right sternal border radiating to the neck.     No gallop.  Pulmonary:     Effort: Pulmonary effort is normal.     Breath sounds: Normal breath sounds.  Abdominal:      General: Bowel sounds are normal.     Palpations: Abdomen is soft.  Musculoskeletal:     Right lower leg: No edema.     Left lower leg: No edema.    Studies Reviewed: .    Carotid artery duplex 07/10/2023: Right Carotid: Velocities in the right ICA are consistent with a 1-39% stenosis. Left Carotid: The extracranial vessels were near-normal with only minimal wall thickening or plaque. Vertebrals:  Bilateral vertebral arteries demonstrate antegrade flow. Subclavians: Normal flow hemodynamics were seen in bilateral subclavian arteries.  ECHOCARDIOGRAM COMPLETE BUBBLE STUDY 07/10/2023  1. Left ventricular ejection fraction, by estimation, is 60 to 65%. The left ventricle has normal function. The left ventricle has no regional wall motion abnormalities. There is mild concentric left ventricular hypertrophy. Left ventricular diastolic parameters are consistent with Grade I diastolic dysfunction (impaired relaxation). 2. Right ventricular systolic function is normal. The right ventricular size is normal. 3. Left atrial size was mildly dilated. 4. The mitral valve is normal in structure. Trivial mitral valve regurgitation. No evidence of mitral stenosis. 5. The aortic valve is tricuspid. There is moderate calcification of the aortic valve. Aortic valve regurgitation is not visualized. Moderate to severe aortic valve stenosis. Aortic valve area, by VTI measures 0.72 cm. Aortic valve mean gradient measures 28.5 mmHg. Aortic valve Vmax measures 3.50 m/s.   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 Incomplete right bundle branch block.   Medications and allergies    No Known Allergies   Current Outpatient Medications:    amLODipine  (NORVASC ) 10 MG tablet, Take 1 tablet (10 mg total) by mouth daily., Disp: 90 tablet, Rfl: 1   aspirin  EC 81 MG tablet, Take 81 mg  by mouth daily., Disp: , Rfl:    atorvastatin  (LIPITOR) 20 MG tablet, Take 20 mg by mouth daily., Disp: , Rfl:    Cholecalciferol (VITAMIN D ) 50 MCG (2000 UT) tablet, Take 2,000 Units by mouth daily. One Tablet daily, Disp: , Rfl:    cloNIDine  (CATAPRES  - DOSED IN MG/24 HR) 0.2  mg/24hr patch, Place 0.2 mg onto the skin once a week., Disp: , Rfl:    labetalol  (NORMODYNE ) 200 MG tablet, Take 200 mg by mouth 2 (two) times daily., Disp: , Rfl:    metFORMIN  (GLUCOPHAGE ) 1000 MG tablet, Take 1 tablet (1,000 mg total) by mouth 2 (two) times daily., Disp: 180 tablet, Rfl: 1   Semaglutide , 2 MG/DOSE, (OZEMPIC , 2 MG/DOSE,) 8 MG/3ML SOPN, Inject 2 mg into the skin once a week., Disp: 3 mL, Rfl: 11   valsartan-hydrochlorothiazide  (DIOVAN-HCT) 320-12.5 MG tablet, Take 1 tablet by mouth daily., Disp: , Rfl:    ASSESSMENT AND PLAN: .      ICD-10-CM   1. Essential hypertension  I10     2. Moderate aortic stenosis  I35.0 ECHOCARDIOGRAM COMPLETE    3. Asymptomatic stenosis of right carotid artery  I65.21       Assessment and Plan    Aortic Stenosis Moderate aortic stenosis with slight progression compared to two years ago. No current symptoms of dyspnea, angina, or syncope. No surgical intervention needed at this time. Surgical intervention may be considered if symptoms develop. - Repeat echocardiogram before next visit - Follow-up in one year  Carotid Stenosis Previously moderate carotid stenosis now improved to very mild. LDL well controlled at 57 with atorvastatin  20 mg daily. Triglycerides slightly elevated. No further carotid screening needed at this time. - Continue atorvastatin  20 mg daily - Encourage weight loss and dietary modifications  Hypertension Well-controlled hypertension with current medications. Blood pressure at goal (113/58). - Continue amlodipine  10 mg daily - Continue valsartan HCT 320/12.5 mg daily - Continue labetalol  200 mg twice a day  Hyperlipidemia LDL well controlled  with atorvastatin . Triglycerides slightly elevated. Advised to eat slowly and take breaks during meals to aid in weight loss. - Continue atorvastatin  20 mg daily - Encourage weight loss and dietary modifications  Type 2 Diabetes Mellitus Type 2 diabetes managed with Ozempic . Significant weight loss achieved over the past five years. Advised to eat slowly and take breaks during meals to aid in weight loss. - Continue Ozempic  at max dose - Encourage slow eating habits to aid in weight loss  General Health Maintenance General health maintenance discussed including medication adherence and lifestyle modifications. - Continue one baby aspirin  daily  Follow-up - Follow-up in one year - Order echocardiogram before next visit.      Signed,  Gordy Bergamo, MD, [facility] 09/12/2023, 10:14 AM [facility] [address] [place], [place] [address] Phone: [phone]. Fax:  [phone]

## 2023-09-12 NOTE — Patient Instructions (Signed)
 Medication Instructions:  Your physician recommends that you continue on your current medications as directed. Please refer to the Current Medication list given to you today.  *If you need a refill on your cardiac medications before your next appointment, please call your pharmacy*   Lab Work: none If you have labs (blood work) drawn today and your tests are completely normal, you will receive your results only by: MyChart Message (if you have MyChart) OR A paper copy in the mail If you have any lab test that is abnormal or we need to change your treatment, we will call you to review the results.   Testing/Procedures: Your physician has requested that you have an echocardiogram in one year. Echocardiography is a painless test that uses sound waves to create images of your heart. It provides your doctor with information about the size and shape of your heart and how well your heart's chambers and valves are working. This procedure takes approximately one hour. There are no restrictions for this procedure. Please do NOT wear cologne, perfume, aftershave, or lotions (deodorant is allowed). Please arrive 15 minutes prior to your appointment time.  Please note: We ask at that you not bring children with you during ultrasound (echo/ vascular) testing. Due to room size and safety concerns, children are not allowed in the ultrasound rooms during exams. Our front office staff cannot provide observation of children in our lobby area while testing is being conducted. An adult accompanying a patient to their appointment will only be allowed in the ultrasound room at the discretion of the ultrasound technician under special circumstances. We apologize for any inconvenience.    Follow-Up: At [facility], you and your health needs are our priority.  As part of our continuing mission to provide you with exceptional heart care, we have created designated Provider Care Teams.  These Care Teams  include your primary Cardiologist (physician) and Advanced Practice Providers (APPs -  Physician Assistants and Nurse Practitioners) who all work together to provide you with the care you need, when you need it.  We recommend signing up for the patient portal called "MyChart".  Sign up information is provided on this After Visit Summary.  MyChart is used to connect with patients for Virtual Visits (Telemedicine).  Patients are able to view lab/test results, encounter notes, upcoming appointments, etc.  Non-urgent messages can be sent to your provider as well.   To learn more about what you can do with MyChart, go to [email or web address].    Your next appointment:   12 month(s)  Provider:   Yates Decamp, MD     Other Instructions

## 2023-09-14 ENCOUNTER — Encounter: Payer: Self-pay | Admitting: Family Medicine

## 2023-10-02 ENCOUNTER — Encounter: Payer: Self-pay | Admitting: Family Medicine

## 2023-10-02 DIAGNOSIS — E119 Type 2 diabetes mellitus without complications: Secondary | ICD-10-CM | POA: Diagnosis not present

## 2023-11-06 ENCOUNTER — Other Ambulatory Visit: Payer: Self-pay | Admitting: *Deleted

## 2023-11-06 DIAGNOSIS — E782 Mixed hyperlipidemia: Secondary | ICD-10-CM

## 2023-11-06 DIAGNOSIS — E1165 Type 2 diabetes mellitus with hyperglycemia: Secondary | ICD-10-CM

## 2023-11-06 DIAGNOSIS — E1159 Type 2 diabetes mellitus with other circulatory complications: Secondary | ICD-10-CM

## 2023-11-09 ENCOUNTER — Other Ambulatory Visit: Payer: Medicare PPO

## 2023-11-09 DIAGNOSIS — I152 Hypertension secondary to endocrine disorders: Secondary | ICD-10-CM

## 2023-11-09 DIAGNOSIS — E782 Mixed hyperlipidemia: Secondary | ICD-10-CM | POA: Diagnosis not present

## 2023-11-09 DIAGNOSIS — E1165 Type 2 diabetes mellitus with hyperglycemia: Secondary | ICD-10-CM

## 2023-11-09 DIAGNOSIS — E1159 Type 2 diabetes mellitus with other circulatory complications: Secondary | ICD-10-CM | POA: Diagnosis not present

## 2023-11-10 LAB — CBC WITH DIFFERENTIAL/PLATELET
Basophils Absolute: 0.1 10*3/uL (ref 0.0–0.2)
Basos: 1 %
EOS (ABSOLUTE): 0.4 10*3/uL (ref 0.0–0.4)
Eos: 5 %
Hematocrit: 44.5 % (ref 37.5–51.0)
Hemoglobin: 15.1 g/dL (ref 13.0–17.7)
Immature Grans (Abs): 0 10*3/uL (ref 0.0–0.1)
Immature Granulocytes: 0 %
Lymphocytes Absolute: 1.8 10*3/uL (ref 0.7–3.1)
Lymphs: 23 %
MCH: 29.6 pg (ref 26.6–33.0)
MCHC: 33.9 g/dL (ref 31.5–35.7)
MCV: 87 fL (ref 79–97)
Monocytes Absolute: 0.6 10*3/uL (ref 0.1–0.9)
Monocytes: 8 %
Neutrophils Absolute: 4.9 10*3/uL (ref 1.4–7.0)
Neutrophils: 63 %
Platelets: 209 10*3/uL (ref 150–450)
RBC: 5.1 x10E6/uL (ref 4.14–5.80)
RDW: 13.4 % (ref 11.6–15.4)
WBC: 7.8 10*3/uL (ref 3.4–10.8)

## 2023-11-10 LAB — COMPREHENSIVE METABOLIC PANEL WITH GFR
ALT: 22 IU/L (ref 0–44)
AST: 18 IU/L (ref 0–40)
Albumin: 4.5 g/dL (ref 3.8–4.8)
Alkaline Phosphatase: 114 IU/L (ref 44–121)
BUN/Creatinine Ratio: 17 (ref 10–24)
BUN: 15 mg/dL (ref 8–27)
Bilirubin Total: 1.3 mg/dL — ABNORMAL HIGH (ref 0.0–1.2)
CO2: 24 mmol/L (ref 20–29)
Calcium: 10.1 mg/dL (ref 8.6–10.2)
Chloride: 100 mmol/L (ref 96–106)
Creatinine, Ser: 0.88 mg/dL (ref 0.76–1.27)
Globulin, Total: 2.5 g/dL (ref 1.5–4.5)
Glucose: 162 mg/dL — ABNORMAL HIGH (ref 70–99)
Potassium: 4.3 mmol/L (ref 3.5–5.2)
Sodium: 141 mmol/L (ref 134–144)
Total Protein: 7 g/dL (ref 6.0–8.5)
eGFR: 89 mL/min/{1.73_m2} (ref >59)

## 2023-11-10 LAB — MICROALBUMIN / CREATININE URINE RATIO
Creatinine, Urine: 148.9 mg/dL
Microalb/Creat Ratio: 24 mg/g{creat} (ref 0–29)
Microalbumin, Urine: 35.6 ug/mL

## 2023-11-10 LAB — LIPID PANEL
Chol/HDL Ratio: 3.9 ratio (ref 0.0–5.0)
Cholesterol, Total: 118 mg/dL (ref 100–199)
HDL: 30 mg/dL — ABNORMAL LOW (ref >39)
LDL Chol Calc (NIH): 68 mg/dL (ref 0–99)
Triglycerides: 106 mg/dL (ref 0–149)
VLDL Cholesterol Cal: 20 mg/dL (ref 5–40)

## 2023-11-10 LAB — HEMOGLOBIN A1C
Est. average glucose Bld gHb Est-mCnc: 171 mg/dL
Hgb A1c MFr Bld: 7.6 % — ABNORMAL HIGH (ref 4.8–5.6)

## 2023-11-15 ENCOUNTER — Ambulatory Visit (INDEPENDENT_AMBULATORY_CARE_PROVIDER_SITE_OTHER): Payer: Medicare PPO | Admitting: Family Medicine

## 2023-11-15 ENCOUNTER — Encounter: Payer: Self-pay | Admitting: Family Medicine

## 2023-11-15 VITALS — BP 117/58 | HR 82 | Ht 71.0 in | Wt 213.1 lb

## 2023-11-15 DIAGNOSIS — I152 Hypertension secondary to endocrine disorders: Secondary | ICD-10-CM

## 2023-11-15 DIAGNOSIS — E1165 Type 2 diabetes mellitus with hyperglycemia: Secondary | ICD-10-CM

## 2023-11-15 DIAGNOSIS — E1159 Type 2 diabetes mellitus with other circulatory complications: Secondary | ICD-10-CM | POA: Diagnosis not present

## 2023-11-15 DIAGNOSIS — E785 Hyperlipidemia, unspecified: Secondary | ICD-10-CM

## 2023-11-15 DIAGNOSIS — E1169 Type 2 diabetes mellitus with other specified complication: Secondary | ICD-10-CM | POA: Diagnosis not present

## 2023-11-15 DIAGNOSIS — Z7985 Long-term (current) use of injectable non-insulin antidiabetic drugs: Secondary | ICD-10-CM | POA: Diagnosis not present

## 2023-11-15 DIAGNOSIS — Z7984 Long term (current) use of oral hypoglycemic drugs: Secondary | ICD-10-CM

## 2023-11-15 NOTE — Patient Instructions (Signed)
 It was nice to see you today,  We addressed the following topics today: -Your A1c was 7.6.  This is a little bit higher than we would like it to be.  Hopefully it would be closer to 7.  For now I would like you to try and adjust this with dietary changes.  Tried to reduce your intake of bread, pasta, rice or other carbohydrates as much as you can. - Otherwise your lab test looked good. - We will follow-up again in 3 months.  Have a great day,  Frederic Jericho, MD

## 2023-11-15 NOTE — Progress Notes (Unsigned)
   Established Patient Office Visit  Subjective   Patient ID: Christopher Rasmussen, male    DOB: 09/29/1945  Age: 78 y.o. MRN: [id number]  Chief Complaint  Patient presents with   Medical Management of Chronic Issues    HPI Patient here for his 54-month follow-up for diabetes.  We discussed his labs which included an A1c of 7.6.  He fluctuates generally between 6.5 and 7.5.  He is on max dose metformin and max dose of Ozempic.  He does acknowledge that he has suboptimal control of his carbohydrate intake.  We discussed foods to limit including high carbohydrate grains and breads.  Hypertension-patient takes amlodipine, valsartan-HCTZ and also wears a clonidine patch.  Has been wearing the clonidine patch for a few years, prescribed by Dr. Nadara Eaton.  We discussed Jardiance for his diabetes, holding off on this until we see him again and see if he can improve it with diet.  His blood pressure is on the lower end of normal currently and Jardiance does have some slight blood pressure lowering effects.  Patient gets his yearly eye exams from [facility] with Dr. Genia Del.  Last went in February.    The ASCVD Risk score (Arnett DK, et al., 2019) failed to calculate for the following reasons:   The valid total cholesterol range is 130 to 320 mg/dL  Health Maintenance Due  Topic Date Due   OPHTHALMOLOGY EXAM  Never done   DTaP/Tdap/Td (1 - Tdap) Never done   Medicare Annual Wellness (AWV)  12/22/2023      Objective:     BP (!) 117/58   Pulse 82   Ht 5\' 11"  (1.803 m)   Wt 213 lb 1.9 oz (96.7 kg)   SpO2 96%   BMI 29.72 kg/m  {Vitals History (Optional):23777}  Physical Exam General: Alert, oriented CV: Rate rhythm, 2/6 systolic murmur Pulmonary: Lungs clear bilaterally   No results found for any visits on 11/15/23.      Assessment & Plan:   There are no diagnoses linked to this encounter.   Return in about 3 months (around 02/14/2024) for DM.    Christopher Kitty, MD

## 2023-11-17 NOTE — Assessment & Plan Note (Addendum)
 A1c 7.6, slightly above his goal.  On max dose Ozempic and metformin.  Advised patient to concentrate on improving his diet.  Holding off on adding Jardiance due to concern for lowering his blood pressure even further.  Follow-up 3 months.  Will send in records request from his eye doctor, Dr. Genia Del at [facility]

## 2023-11-17 NOTE — Assessment & Plan Note (Signed)
 Continue atorvastatin 20 mg

## 2023-11-17 NOTE — Assessment & Plan Note (Signed)
 Continue amlodipine, valsartan-HCTZ, labetalol and clonidine patch.  Hypertension managed by his cardiologist, Dr. Jacinto Halim.

## 2023-12-22 ENCOUNTER — Other Ambulatory Visit: Payer: Self-pay | Admitting: Cardiology

## 2023-12-22 DIAGNOSIS — E1169 Type 2 diabetes mellitus with other specified complication: Secondary | ICD-10-CM

## 2023-12-28 ENCOUNTER — Ambulatory Visit (INDEPENDENT_AMBULATORY_CARE_PROVIDER_SITE_OTHER): Payer: Medicare PPO

## 2023-12-28 DIAGNOSIS — Z0000 Encounter for general adult medical examination without abnormal findings: Secondary | ICD-10-CM

## 2023-12-28 NOTE — Progress Notes (Signed)
 Subjective:   Christopher Rasmussen is a 78 y.o. who presents for a Medicare Wellness preventive visit.  As a reminder, Annual Wellness Visits don't include a physical exam, and some assessments may be limited, especially if this visit is performed virtually. We may recommend an in-person follow-up visit with your provider if needed.  Visit Complete: Virtual I connected with  Christopher Rasmussen on 12/28/23 by a audio enabled telemedicine application and verified that I am speaking with the correct person using two identifiers.  Patient Location: Home  Provider Location: Office/Clinic  I discussed the limitations of evaluation and management by telemedicine. The patient expressed understanding and agreed to proceed.  Vital Signs: Because this visit was a virtual/telehealth visit, some criteria may be missing or patient reported. Any vitals not documented were not able to be obtained and vitals that have been documented are patient reported.  VideoError- Librarian, academic were attempted between this provider and patient, however failed, due to patient having technical difficulties OR patient did not have access to video capability.  We continued and completed visit with audio only.   Persons Participating in Visit: Patient.  AWV Questionnaire: No: Patient Medicare AWV questionnaire was not completed prior to this visit.  Cardiac Risk Factors include: advanced age (>64men, >65 women);diabetes mellitus;dyslipidemia;hypertension;male gender     Objective:     Today's Vitals   There is no height or weight on file to calculate BMI.     12/28/2023    9:31 AM 01/23/2023    9:03 AM 12/22/2022    9:04 AM 10/21/2022   10:07 AM 04/15/2021    9:24 AM 01/13/2015    9:36 AM 12/19/2014   11:30 PM  Advanced Directives  Does Patient Have a Medical Advance Directive? Yes Yes Yes Yes No No No  Type of Estate agent of [place];Living will  Healthcare Power of  [place];Living will      Copy of Healthcare Power of Attorney in Chart? No - copy requested  No - copy requested      Would patient like information on creating a medical advance directive?      No - patient declined information No - patient declined information    Current Medications (verified) Outpatient Encounter Medications as of 12/28/2023  Medication Sig   amLODipine  (NORVASC ) 10 MG tablet Take 1 tablet (10 mg total) by mouth daily.   aspirin  EC 81 MG tablet Take 81 mg by mouth daily.   atorvastatin  (LIPITOR) 20 MG tablet TAKE 1 TABLET (20 MG TOTAL) BY MOUTH DAILY. STOP SIMVASTATIN    Cholecalciferol (VITAMIN D ) 50 MCG (2000 UT) tablet Take 2,000 Units by mouth daily. One Tablet daily   cloNIDine  (CATAPRES  - DOSED IN MG/24 HR) 0.2 mg/24hr patch PLACE 1 PATCH ONTO THE SKIN ONCE A WEEK.   labetalol  (NORMODYNE ) 200 MG tablet Take 200 mg by mouth 2 (two) times daily.   metFORMIN  (GLUCOPHAGE ) 1000 MG tablet Take 1 tablet (1,000 mg total) by mouth 2 (two) times daily.   OZEMPIC , 2 MG/DOSE, 8 MG/3ML SOPN INJECT 2 MG INTO THE SKIN ONCE A WEEK.   valsartan-hydrochlorothiazide  (DIOVAN-HCT) 320-12.5 MG tablet TAKE 1 TABLET BY MOUTH EVERY MORNING   No facility-administered encounter medications on file as of 12/28/2023.    Allergies (verified) Patient has no known allergies.   History: Past Medical History:  Diagnosis Date   Abdominal aortic aneurysm (AAA) (HCC)    Abdominal aortic aneurysm (AAA) 30 to 34 mm in diameter (HCC)  10/04/2018   Aortic stenosis    Arthritis    Asymptomatic stenosis of right carotid artery 10/04/2018   Carotid stenosis    Heart block    High cholesterol    under control   Hypertension    Type II diabetes mellitus (HCC)    Past Surgical History:  Procedure Laterality Date   CATARACT EXTRACTION     KNEE ARTHROSCOPY Left 1990's   KNEE CLOSED REDUCTION Left 10/17/2014   Procedure: CLOSED MANIPULATION LEFT KNEE UNDER ANESTHESIA;  Surgeon: Arnie Lao,  MD;  Location: [facility];  Service: Orthopedics;  Laterality: Left;   KNEE CLOSED REDUCTION Left 12/19/2014   Procedure: CLOSED MANIPULATION  LEFT KNEE;  Surgeon: Arnie Lao, MD;  Location: [facility];  Service: Orthopedics;  Laterality: Left;   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   Procedure: LEFT TOTAL KNEE ARTHROPLASTY;  Surgeon: Arnie Lao, MD;  Location: [facility];  Service: Orthopedics;  Laterality: Left;   TOTAL KNEE ARTHROPLASTY Right 12/19/2014   Procedure: RIGHT TOTAL KNEE ARTHROPLASTY;  Surgeon: Arnie Lao, MD;  Location: [facility];  Service: Orthopedics;  Laterality: Right;   Family History  Problem Relation Age of Onset   CAD Father    Heart attack Father    Stroke Mother    Anuerysm Mother    Social History   Socioeconomic History   Marital status: Married    Spouse name: Piers Baade   Number of children: 2   Years of education: Not on file   Highest education level: Not on file  Occupational History   Not on file  Tobacco Use   Smoking status: Never    Passive exposure: Never   Smokeless tobacco: Never  Vaping Use   Vaping status: Never Used  Substance and Sexual Activity   Alcohol use: No   Drug use: No   Sexual activity: Not Currently    Birth control/protection: None  Other Topics Concern   Not on file  Social History Narrative   Not on file   Social Drivers of Health   Financial Resource Strain: Low Risk  (12/28/2023)   Overall Financial Resource Strain (CARDIA)    Difficulty of Paying Living Expenses: Not hard at all  Food Insecurity: No Food Insecurity (12/28/2023)   Hunger Vital Sign    Worried About Running Out of Food in the Last Year: Never true    Ran Out of Food in the Last Year: Never true  Transportation Needs: No Transportation Needs (12/28/2023)   PRAPARE - Administrator, Civil Service (Medical): No    Lack of Transportation (Non-Medical): No  Physical Activity: Inactive (12/28/2023)   Exercise Vital  Sign    Days of Exercise per Week: 0 days    Minutes of Exercise per Session: 0 min  Stress: No Stress Concern Present (12/28/2023)   [employer] of Occupational Health - Occupational Stress Questionnaire    Feeling of Stress : Not at all  Social Connections: Moderately Integrated (12/28/2023)   Social Connection and Isolation Panel [NHANES]    Frequency of Communication with Friends and Family: Three times a week    Frequency of Social Gatherings with Friends and Family: More than three times a week    Attends Religious Services: More than 4 times per year    Active Member of [employer] or Organizations: No    Attends Banker Meetings: Never    Marital Status: Married    Tobacco Counseling Counseling given: Not  Answered    Clinical Intake:  Pre-visit preparation completed: Yes  Pain : No/denies pain     Nutritional Risks: None Diabetes: Yes CBG done?: No Did pt. bring in CBG monitor from home?: No  Lab Results  Component Value Date   HGBA1C 7.6 (H) 11/09/2023   HGBA1C 7.3 (A) 08/17/2023   HGBA1C 6.6 05/17/2023     How often do you need to have someone help you when you read instructions, pamphlets, or other written materials from your doctor or pharmacy?: 1 - Never  Interpreter Needed?: No  Information entered by :: NAllen LPN   Activities of Daily Living     12/28/2023    9:27 AM  In your present state of health, do you have any difficulty performing the following activities:  Hearing? 0  Vision? 0  Difficulty concentrating or making decisions? 0  Walking or climbing stairs? 0  Dressing or bathing? 0  Doing errands, shopping? 0  Preparing Food and eating ? N  Using the Toilet? N  In the past six months, have you accidently leaked urine? N  Do you have problems with loss of bowel control? N  Managing your Medications? N  Managing your Finances? N  Housekeeping or managing your Housekeeping? N    Patient Care Team: Noreene Bearded, PA  as PCP - General (Family Medicine) Knox Perl, MD as PCP - Cardiology (Cardiology) Alto Atta Scot Cutter, MD as Consulting Physician (Ophthalmology)  Indicate any recent Medical Services you may have received from other than [facility] providers in the past year (date may be approximate).     Assessment:    This is a routine wellness examination for Royal.  Hearing/Vision screen Hearing Screening - Comments:: Denies hearing issues Vision Screening - Comments:: Regular eye exams, Dr. Alto Atta, [facility]   Goals Addressed             This Visit's Progress    Patient Stated       12/28/2023, keep numbers good and lose weight       Depression Screen     12/28/2023    9:33 AM 11/15/2023   10:09 AM 01/23/2023    9:03 AM 12/22/2022    9:01 AM 10/21/2022   10:06 AM 06/22/2022   10:13 AM 03/30/2022   10:10 AM  PHQ 2/9 Scores  PHQ - 2 Score 0 0 0 0 0 0 0  PHQ- 9 Score 0 0 0  0 0 0    Fall Risk     12/28/2023    9:32 AM 01/23/2023    9:04 AM 12/22/2022    9:03 AM 10/21/2022   10:06 AM 06/22/2022   10:13 AM  Fall Risk   Falls in the past year? 0 0 0 0 0  Number falls in past yr: 0 0 0 0 0  Injury with Fall? 0 0 0 0 0  Risk for fall due to : Medication side effect No Fall Risks No Fall Risks    Follow up Falls evaluation completed;Falls prevention discussed Falls evaluation completed Falls prevention discussed Falls evaluation completed     MEDICARE RISK AT HOME:  Medicare Risk at Home Any stairs in or around the home?: Yes If so, are there any without handrails?: No Home free of loose throw rugs in walkways, pet beds, electrical cords, etc?: Yes Adequate lighting in your home to reduce risk of falls?: Yes Life alert?: No Use of a cane, walker or w/c?: No Grab bars in the bathroom?:  No Shower chair or bench in shower?: No Elevated toilet seat or a handicapped toilet?: No  TIMED UP AND GO:  Was the test performed?  No  Cognitive Function: 6CIT completed        12/28/2023     9:33 AM 12/22/2022    9:04 AM  6CIT Screen  What Year? 0 points 0 points  What month? 0 points 0 points  What time? 0 points 0 points  Count back from 20 0 points 0 points  Months in reverse 0 points 0 points  Repeat phrase 0 points 0 points  Total Score 0 points 0 points    Immunizations Immunization History  Administered Date(s) Administered   Fluad Quad(high Dose 65+) 07/23/2022   Hepatitis B, ADULT 03/28/1997, 05/02/1997, 10/03/1997   Influenza, High Dose Seasonal PF 07/09/2019   PFIZER(Purple Top)SARS-COV-2 Vaccination 10/01/2019, 10/22/2019, 07/07/2020, 01/20/2021   PNEUMOCOCCAL CONJUGATE-20 08/17/2023   Pfizer Covid-19 Vaccine Bivalent Booster 64yrs & up 10/04/2021, 07/20/2022   Pfizer(Comirnaty)Fall Seasonal Vaccine 12 years and older 06/06/2023   Zoster Recombinant(Shingrix) 06/30/2021, 09/16/2021    Screening Tests Health Maintenance  Topic Date Due   OPHTHALMOLOGY EXAM  Never done   DTaP/Tdap/Td (1 - Tdap) Never done   COVID-19 Vaccine (8 - 2024-25 season) 12/05/2023   INFLUENZA VACCINE  03/08/2024   HEMOGLOBIN A1C  05/10/2024   FOOT EXAM  05/16/2024   Diabetic kidney evaluation - eGFR measurement  11/08/2024   Diabetic kidney evaluation - Urine ACR  11/08/2024   Medicare Annual Wellness (AWV)  12/27/2024   Pneumonia Vaccine 68+ Years old  Completed   Hepatitis C Screening  Completed   Zoster Vaccines- Shingrix  Completed   HPV VACCINES  Aged Out   Meningococcal B Vaccine  Aged Out    Health Maintenance  Health Maintenance Due  Topic Date Due   OPHTHALMOLOGY EXAM  Never done   DTaP/Tdap/Td (1 - Tdap) Never done   COVID-19 Vaccine (8 - 2024-25 season) 12/05/2023   Health Maintenance Items Addressed: Due for TDAP vaccine. Requested eye report.  Additional Screening:  Vision Screening: Recommended annual ophthalmology exams for early detection of glaucoma and other disorders of the eye.  Dental Screening: Recommended annual dental exams for proper  oral hygiene  Community Resource Referral / Chronic Care Management: CRR required this visit?  No   CCM required this visit?  No   Plan:    I have personally reviewed and noted the following in the patient's chart:   Medical and social history Use of alcohol, tobacco or illicit drugs  Current medications and supplements including opioid prescriptions. Patient is not currently taking opioid prescriptions. Functional ability and status Nutritional status Physical activity Advanced directives List of other physicians Hospitalizations, surgeries, and ER visits in previous 12 months Vitals Screenings to include cognitive, depression, and falls Referrals and appointments  In addition, I have reviewed and discussed with patient certain preventive protocols, quality metrics, and best practice recommendations. A written personalized care plan for preventive services as well as general preventive health recommendations were provided to patient.   Areatha Beecham, LPN   1/61/0960   After Visit Summary: (MyChart) Due to this being a telephonic visit, the after visit summary with patients personalized plan was offered to patient via MyChart   Notes: Nothing significant to report at this time.

## 2023-12-28 NOTE — Patient Instructions (Signed)
 [name] , Thank you for taking time out of your busy schedule to complete your Annual Wellness Visit with me. I enjoyed our conversation and look forward to speaking with you again next year. I, as well as your care team,  appreciate your ongoing commitment to your health goals. Please review the following plan we discussed and let me know if I can assist you in the future. Your Game plan/ To Do List    Referrals: If you haven't heard from the office you've been referred to, please reach out to them at the phone provided.  N/a Follow up Visits: Next Medicare AWV with our clinical staff: 01/16/2025 at 9:30   Have you seen your provider in the last 6 months (3 months if uncontrolled diabetes)? Yes Next Office Visit with your provider: 02/14/2024 at 11:10  Clinician Recommendations:  Aim for 30 minutes of exercise or brisk walking, 6-8 glasses of water, and 5 servings of fruits and vegetables each day.       This is a list of the screening recommended for you and due dates:  Health Maintenance  Topic Date Due   Eye exam for diabetics  Never done   DTaP/Tdap/Td vaccine (1 - Tdap) Never done   COVID-19 Vaccine (8 - 2024-25 season) 12/05/2023   Flu Shot  03/08/2024   Hemoglobin A1C  05/10/2024   Complete foot exam   05/16/2024   Yearly kidney function blood test for diabetes  11/08/2024   Yearly kidney health urinalysis for diabetes  11/08/2024   Medicare Annual Wellness Visit  12/27/2024   Pneumonia Vaccine  Completed   Hepatitis C Screening  Completed   Zoster (Shingles) Vaccine  Completed   HPV Vaccine  Aged Out   Meningitis B Vaccine  Aged Out    Advanced directives: (Copy Requested) Please bring a copy of your health care power of attorney and living will to the office to be added to your chart at your convenience. You can mail to [facility] [address] [place], [place] [address] or email to [email or web address] Advance Care Planning is important because  it:  [x]  Makes sure you receive the medical care that is consistent with your values, goals, and preferences  [x]  It provides guidance to your family and loved ones and reduces their decisional burden about whether or not they are making the right decisions based on your wishes.  Follow the link provided in your after visit summary or read over the paperwork we have mailed to you to help you started getting your Advance Directives in place. If you need assistance in completing these, please reach out to us  so that we can help you!  See attachments for Preventive Care and Fall Prevention Tips.

## 2024-01-23 ENCOUNTER — Other Ambulatory Visit: Payer: Self-pay | Admitting: Cardiology

## 2024-01-23 DIAGNOSIS — E1159 Type 2 diabetes mellitus with other circulatory complications: Secondary | ICD-10-CM

## 2024-02-14 ENCOUNTER — Ambulatory Visit: Admitting: Family Medicine

## 2024-02-14 ENCOUNTER — Encounter: Payer: Self-pay | Admitting: Family Medicine

## 2024-02-14 VITALS — BP 128/67 | HR 76 | Ht 71.0 in | Wt 209.4 lb

## 2024-02-14 DIAGNOSIS — I152 Hypertension secondary to endocrine disorders: Secondary | ICD-10-CM | POA: Diagnosis not present

## 2024-02-14 DIAGNOSIS — Z7984 Long term (current) use of oral hypoglycemic drugs: Secondary | ICD-10-CM | POA: Diagnosis not present

## 2024-02-14 DIAGNOSIS — E1159 Type 2 diabetes mellitus with other circulatory complications: Secondary | ICD-10-CM | POA: Diagnosis not present

## 2024-02-14 DIAGNOSIS — E1165 Type 2 diabetes mellitus with hyperglycemia: Secondary | ICD-10-CM

## 2024-02-14 DIAGNOSIS — E785 Hyperlipidemia, unspecified: Secondary | ICD-10-CM | POA: Diagnosis not present

## 2024-02-14 DIAGNOSIS — E1169 Type 2 diabetes mellitus with other specified complication: Secondary | ICD-10-CM

## 2024-02-14 LAB — POCT GLYCOSYLATED HEMOGLOBIN (HGB A1C): HbA1c POC (<> result, manual entry): 7 % (ref 4.0–5.6)

## 2024-02-14 NOTE — Patient Instructions (Signed)
 It was nice to see you today,  We addressed the following topics today: -Your A1c was 7.0.  We will hold off on making any changes to your medication. - In 3 months you can schedule a lab visit for your A1c and follow back up with me in 6 months (around January)  Have a great day,  Rolan Slain, MD

## 2024-02-14 NOTE — Progress Notes (Signed)
   Established Patient Office Visit  Subjective   Patient ID: Christopher Rasmussen, male    DOB: 25-Jan-1946  Age: 78 y.o. MRN: [id number]  Chief Complaint  Patient presents with   Medical Management of Chronic Issues    HPI  DM2-patient made some slight improvements to his diet since last visit.  His A1c is lower and was 7.0.  He wants to hold off on Jardiance he will.  He sees Dr. Regenia at [facility] for diabetic retinopathy screening.  Patient denies any neuropathy or foot ulcerations.    The ASCVD Risk score (Arnett DK, et al., 2019) failed to calculate for the following reasons:   The valid total cholesterol range is 130 to 320 mg/dL  Health Maintenance Due  Topic Date Due   OPHTHALMOLOGY EXAM  Never done   DTaP/Tdap/Td (1 - Tdap) Never done   COVID-19 Vaccine (8 - 2024-25 season) 12/05/2023      Objective:     BP 128/67   Pulse 76   Ht 5' 11 (1.803 m)   Wt 209 lb 6.4 oz (95 kg)   SpO2 98%   BMI 29.21 kg/m    Physical Exam General: Alert, oriented Pulmonary: No respiratory distress Psych: Pleasant affect Extremities:Normal monofilament testing.  No foot ulcerations.  Normal DP pulses bilaterally.   Results for orders placed or performed in visit on 02/14/24  POCT glycosylated hemoglobin (Hb A1C)  Result Value Ref Range   Hemoglobin A1C     HbA1c POC (<> result, manual entry) 7.0 4.0 - 5.6 %   HbA1c, POC (prediabetic range)     HbA1c, POC (controlled diabetic range)          Assessment & Plan:   Type 2 diabetes mellitus with hyperglycemia, without long-term current use of insulin  (HCC) Assessment & Plan: A1c 7.0.  Will hold off on adding Jardiance.  Continue with metformin  and Ozempic .  Foot exam normal today.  A1c lab visit 3 months, follow-up with me in 6 months.  Will get records request from his ophthalmologist, Dr. Regenia  Orders: -     POCT glycosylated hemoglobin (Hb A1C) -     Hemoglobin A1c; Future  Hyperlipidemia associated with  type 2 diabetes mellitus (HCC) -     POCT glycosylated hemoglobin (Hb A1C)  Hypertension associated with diabetes (HCC) -     POCT glycosylated hemoglobin (Hb A1C)     Return in about 6 months (around 08/16/2024) for DM.    Toribio MARLA Slain, MD

## 2024-02-14 NOTE — Assessment & Plan Note (Signed)
 A1c 7.0.  Will hold off on adding Jardiance.  Continue with metformin  and Ozempic .  Foot exam normal today.  A1c lab visit 3 months, follow-up with me in 6 months.  Will get records request from his ophthalmologist, Dr. Regenia

## 2024-03-25 DIAGNOSIS — H26491 Other secondary cataract, right eye: Secondary | ICD-10-CM | POA: Diagnosis not present

## 2024-04-10 ENCOUNTER — Other Ambulatory Visit: Payer: Self-pay | Admitting: Family Medicine

## 2024-04-10 DIAGNOSIS — E1159 Type 2 diabetes mellitus with other circulatory complications: Secondary | ICD-10-CM

## 2024-05-13 ENCOUNTER — Other Ambulatory Visit: Payer: Self-pay

## 2024-05-13 DIAGNOSIS — E1169 Type 2 diabetes mellitus with other specified complication: Secondary | ICD-10-CM

## 2024-05-13 DIAGNOSIS — I152 Hypertension secondary to endocrine disorders: Secondary | ICD-10-CM

## 2024-05-13 DIAGNOSIS — E1165 Type 2 diabetes mellitus with hyperglycemia: Secondary | ICD-10-CM

## 2024-05-16 ENCOUNTER — Other Ambulatory Visit

## 2024-05-16 DIAGNOSIS — I152 Hypertension secondary to endocrine disorders: Secondary | ICD-10-CM | POA: Diagnosis not present

## 2024-05-16 DIAGNOSIS — E669 Obesity, unspecified: Secondary | ICD-10-CM | POA: Diagnosis not present

## 2024-05-16 DIAGNOSIS — I719 Aortic aneurysm of unspecified site, without rupture: Secondary | ICD-10-CM | POA: Diagnosis not present

## 2024-05-16 DIAGNOSIS — Z8249 Family history of ischemic heart disease and other diseases of the circulatory system: Secondary | ICD-10-CM | POA: Diagnosis not present

## 2024-05-16 DIAGNOSIS — N529 Male erectile dysfunction, unspecified: Secondary | ICD-10-CM | POA: Diagnosis not present

## 2024-05-16 DIAGNOSIS — E1169 Type 2 diabetes mellitus with other specified complication: Secondary | ICD-10-CM

## 2024-05-16 DIAGNOSIS — E785 Hyperlipidemia, unspecified: Secondary | ICD-10-CM | POA: Diagnosis not present

## 2024-05-16 DIAGNOSIS — E1165 Type 2 diabetes mellitus with hyperglycemia: Secondary | ICD-10-CM | POA: Diagnosis not present

## 2024-05-16 DIAGNOSIS — I10 Essential (primary) hypertension: Secondary | ICD-10-CM | POA: Diagnosis not present

## 2024-05-16 DIAGNOSIS — E1159 Type 2 diabetes mellitus with other circulatory complications: Secondary | ICD-10-CM | POA: Diagnosis not present

## 2024-05-16 DIAGNOSIS — J302 Other seasonal allergic rhinitis: Secondary | ICD-10-CM | POA: Diagnosis not present

## 2024-05-16 DIAGNOSIS — E119 Type 2 diabetes mellitus without complications: Secondary | ICD-10-CM | POA: Diagnosis not present

## 2024-05-16 DIAGNOSIS — I359 Nonrheumatic aortic valve disorder, unspecified: Secondary | ICD-10-CM | POA: Diagnosis not present

## 2024-05-17 ENCOUNTER — Ambulatory Visit: Payer: Self-pay

## 2024-05-17 LAB — CBC WITH DIFFERENTIAL/PLATELET
Basophils Absolute: 0 x10E3/uL (ref 0.0–0.2)
Basos: 1 %
EOS (ABSOLUTE): 0.4 x10E3/uL (ref 0.0–0.4)
Eos: 6 %
Hematocrit: 43.5 % (ref 37.5–51.0)
Hemoglobin: 14.9 g/dL (ref 13.0–17.7)
Immature Grans (Abs): 0 x10E3/uL (ref 0.0–0.1)
Immature Granulocytes: 0 %
Lymphocytes Absolute: 1.4 x10E3/uL (ref 0.7–3.1)
Lymphs: 20 %
MCH: 31.2 pg (ref 26.6–33.0)
MCHC: 34.3 g/dL (ref 31.5–35.7)
MCV: 91 fL (ref 79–97)
Monocytes Absolute: 0.4 x10E3/uL (ref 0.1–0.9)
Monocytes: 6 %
Neutrophils Absolute: 4.7 x10E3/uL (ref 1.4–7.0)
Neutrophils: 67 %
Platelets: 197 x10E3/uL (ref 150–450)
RBC: 4.78 x10E6/uL (ref 4.14–5.80)
RDW: 13.2 % (ref 11.6–15.4)
WBC: 7.1 x10E3/uL (ref 3.4–10.8)

## 2024-05-17 LAB — COMPREHENSIVE METABOLIC PANEL WITH GFR
ALT: 19 IU/L (ref 0–44)
AST: 15 IU/L (ref 0–40)
Albumin: 4.5 g/dL (ref 3.8–4.8)
Alkaline Phosphatase: 104 IU/L (ref 47–123)
BUN/Creatinine Ratio: 20 (ref 10–24)
BUN: 15 mg/dL (ref 8–27)
Bilirubin Total: 1 mg/dL (ref 0.0–1.2)
CO2: 25 mmol/L (ref 20–29)
Calcium: 9.9 mg/dL (ref 8.6–10.2)
Chloride: 101 mmol/L (ref 96–106)
Creatinine, Ser: 0.75 mg/dL — ABNORMAL LOW (ref 0.76–1.27)
Globulin, Total: 2.3 g/dL (ref 1.5–4.5)
Glucose: 137 mg/dL — ABNORMAL HIGH (ref 70–99)
Potassium: 4.2 mmol/L (ref 3.5–5.2)
Sodium: 140 mmol/L (ref 134–144)
Total Protein: 6.8 g/dL (ref 6.0–8.5)
eGFR: 94 mL/min/1.73 (ref >59)

## 2024-05-17 LAB — LIPID PANEL
Chol/HDL Ratio: 3.1 ratio (ref 0.0–5.0)
Cholesterol, Total: 109 mg/dL (ref 100–199)
HDL: 35 mg/dL — ABNORMAL LOW (ref >39)
LDL Chol Calc (NIH): 59 mg/dL (ref 0–99)
Triglycerides: 73 mg/dL (ref 0–149)
VLDL Cholesterol Cal: 15 mg/dL (ref 5–40)

## 2024-05-17 LAB — TSH: TSH: 2.06 u[IU]/mL (ref 0.450–4.500)

## 2024-05-17 LAB — VITAMIN D 25 HYDROXY (VIT D DEFICIENCY, FRACTURES): Vit D, 25-Hydroxy: 59.4 ng/mL (ref 30.0–100.0)

## 2024-05-17 LAB — HEMOGLOBIN A1C
Est. average glucose Bld gHb Est-mCnc: 137 mg/dL
Hgb A1c MFr Bld: 6.4 % — ABNORMAL HIGH (ref 4.8–5.6)

## 2024-06-17 ENCOUNTER — Ambulatory Visit

## 2024-06-17 VITALS — BP 121/56 | HR 74 | Temp 97.8°F | Ht 71.0 in | Wt 203.0 lb

## 2024-06-17 DIAGNOSIS — L739 Follicular disorder, unspecified: Secondary | ICD-10-CM | POA: Insufficient documentation

## 2024-06-17 MED ORDER — MUPIROCIN 2 % EX OINT: 1.0000 | TOPICAL_OINTMENT | Freq: Two times a day (BID) | CUTANEOUS | 0 refills | Status: DC

## 2024-06-17 NOTE — Assessment & Plan Note (Signed)
 Suspected folliculitis due to location near hair follicles and presentation. Shingles less likely due to vaccination history and presentation of symptoms.  - Prescribed mupirocin cream, apply twice daily for 7-10 days. - Advised to monitor for discharge, drainage, or fever and report if these occur. - Instructed to continue treatment for 10 days even if symptoms improve. - Sent prescription to [facility].

## 2024-06-17 NOTE — Progress Notes (Signed)
   Acute Office Visit  Subjective:     Patient ID: Christopher Rasmussen, male    DOB: 05/23/1946, 78 y.o.   MRN: [id number]  Chief Complaint  Patient presents with   Spot on the back of Neck    Onset: 4 days ago; sensitive to touch    HPI   History of Present Illness   He is a 78 year old male who presents with a rash on his neck.  Neck rash - Rash appeared on the neck approximately four days ago - Rash is not painful but is slightly sore with neck movement - No pruritus - No known contact with irritants such as poison ivy - No similar rashes in contacts - Rash has not been directly observed by the patient but was shown to him during the visit - Denies stinging, burning, numbness, or tingling in the area.   Herpes zoster immunization - Received shingles vaccination in the past         ROS Per HPI     Objective:    BP (!) 121/56   Pulse 74   Temp 97.8 F (36.6 C) (Oral)   Ht 5' 11 (1.803 m)   Wt 203 lb 0.6 oz (92.1 kg)   SpO2 98%   BMI 28.32 kg/m    Physical Exam Cardiovascular:     Rate and Rhythm: Normal rate and regular rhythm.  Pulmonary:     Effort: Pulmonary effort is normal.     Breath sounds: Normal breath sounds.  Musculoskeletal:     Cervical back: Neck supple.  Lymphadenopathy:     Cervical: No cervical adenopathy.  Skin:    General: Skin is warm and dry.     Comments: Red, vesicular-like lesions with yellow crusting present in a cluster over the left posterior side of neck, at hairline. (See media tab for photo)  Neurological:     Mental Status: He is alert.      No results found for any visits on 06/17/24.      Assessment & Plan:   Folliculitis Assessment & Plan: Suspected folliculitis due to location near hair follicles and presentation. Shingles less likely due to vaccination history and presentation of symptoms.  - Prescribed mupirocin cream, apply twice daily for 7-10 days. - Advised to monitor for discharge, drainage, or  fever and report if these occur. - Instructed to continue treatment for 10 days even if symptoms improve. - Sent prescription to [facility].    Other orders -     Mupirocin; Apply 1 Application topically 2 (two) times daily.  Dispense: 22 g; Refill: 0     Return if symptoms worsen or fail to improve.  Saddie JULIANNA Sacks, PA-C

## 2024-06-17 NOTE — Patient Instructions (Signed)
 VISIT SUMMARY: You came in today because of a rash on your neck that appeared about four days ago. The doctor suspects it is folliculitis, which is an infection of the hair follicles.  YOUR PLAN: FOLLICULITIS OF NECK: The rash on your neck is likely due to an infection of the hair follicles. -Apply mupirocin cream to the affected area twice daily for 7-10 days. -Monitor for any discharge, drainage, or fever and report these symptoms if they occur. -Continue the treatment for 10 days even if the symptoms improve. -Your prescription has been sent to [facility] pharmacy.  If you have any problems before your next visit feel free to message me via MyChart (minor issues or questions) or call the office, otherwise you may reach out to schedule an office visit.  Thank you! Saddie Sacks, PA-C

## 2024-07-24 ENCOUNTER — Other Ambulatory Visit: Payer: Self-pay | Admitting: Family Medicine

## 2024-07-24 DIAGNOSIS — E1165 Type 2 diabetes mellitus with hyperglycemia: Secondary | ICD-10-CM

## 2024-08-06 ENCOUNTER — Other Ambulatory Visit: Payer: Self-pay | Admitting: Cardiology

## 2024-08-06 DIAGNOSIS — I152 Hypertension secondary to endocrine disorders: Secondary | ICD-10-CM

## 2024-08-16 ENCOUNTER — Ambulatory Visit

## 2024-08-16 VITALS — BP 110/62 | HR 79 | Temp 98.2°F | Ht 71.0 in | Wt 208.0 lb

## 2024-08-16 DIAGNOSIS — E1159 Type 2 diabetes mellitus with other circulatory complications: Secondary | ICD-10-CM | POA: Diagnosis not present

## 2024-08-16 DIAGNOSIS — I152 Hypertension secondary to endocrine disorders: Secondary | ICD-10-CM

## 2024-08-16 DIAGNOSIS — L739 Follicular disorder, unspecified: Secondary | ICD-10-CM

## 2024-08-16 DIAGNOSIS — E1169 Type 2 diabetes mellitus with other specified complication: Secondary | ICD-10-CM | POA: Diagnosis not present

## 2024-08-16 DIAGNOSIS — E1165 Type 2 diabetes mellitus with hyperglycemia: Secondary | ICD-10-CM | POA: Diagnosis not present

## 2024-08-16 DIAGNOSIS — R011 Cardiac murmur, unspecified: Secondary | ICD-10-CM | POA: Diagnosis not present

## 2024-08-16 DIAGNOSIS — E785 Hyperlipidemia, unspecified: Secondary | ICD-10-CM | POA: Diagnosis not present

## 2024-08-16 DIAGNOSIS — Z7984 Long term (current) use of oral hypoglycemic drugs: Secondary | ICD-10-CM | POA: Diagnosis not present

## 2024-08-16 LAB — POCT GLYCOSYLATED HEMOGLOBIN (HGB A1C): Hemoglobin A1C: 6.8 % — AB (ref 4.0–5.6)

## 2024-08-16 NOTE — Patient Instructions (Signed)
 VISIT SUMMARY: Today you had a routine follow-up visit to check your A1c and discuss your diabetes and hypertension management. Your diabetes is well-managed, and you have maintained your weight loss. Your hypertension is stable, and you have no new symptoms. Your previous rash has resolved, and your immunizations are up to date except for tetanus.  YOUR PLAN: TYPE 2 DIABETES MELLITUS: Your diabetes is well-managed with metformin  and Ozempic , and you have maintained a stable weight after losing forty pounds over the last six years. -We checked your A1c today with a finger prick. -Continue taking metformin  and Ozempic  as prescribed.  HYPERTENSION: Your blood pressure was low today, possibly due to the clonidine  patch, but you did not experience any dizziness or faintness. -We rechecked your blood pressure before you left. -Monitor your blood pressure at home or at [facility] for the next couple of weeks. -If your blood pressure remains low, we may consider reducing your hydrochlorothiazide .  CARDIAC MURMUR: You have a long-standing cardiac murmur that was previously noted by another provider. -No changes to your current management plan for the cardiac murmur.  GENERAL HEALTH MAINTENANCE: Your annual eye exam is up to date, but you are due for a tetanus vaccination. -We will obtain your eye exam records from Dr. Sueellen. -Consider getting a tetanus vaccination at a later date.  If you have any problems before your next visit feel free to message me via MyChart (minor issues or questions) or call the office, otherwise you may reach out to schedule an office visit.  Thank you! Saddie Sacks, PA-C

## 2024-08-20 DIAGNOSIS — R011 Cardiac murmur, unspecified: Secondary | ICD-10-CM | POA: Insufficient documentation

## 2024-08-20 NOTE — Assessment & Plan Note (Signed)
 BP at goal of >130/80. Continue amlodipine , valsartan-HCTZ, labetalol  and clonidine  patch. Hypertension managed by his cardiologist, Dr. Ladona.  - Advised to monitor for sx of hypotension and notify us  if he experiences any of these symptoms. Patient verbalized understanding and was in agreement with the plan.

## 2024-08-20 NOTE — Assessment & Plan Note (Signed)
 A1c at goal of >7.0 at 6.8. Continue with metformin  and Ozempic . Foot exam UTD, UACR UTD. A1c lab visit 3 months, follow-up with me in 6 months. Will get records request from his ophthalmologist, Dr. Regenia

## 2024-08-20 NOTE — Assessment & Plan Note (Signed)
 Harsh blowing murmur noted on exam. This has been addressed with cardiology and he is following with cardiology regularly.

## 2024-08-20 NOTE — Progress Notes (Signed)
 "  Established Patient Office Visit  Subjective   Patient ID: Christopher Rasmussen, male    DOB: 1945/12/29  Age: 79 y.o. MRN: [id number]  Chief Complaint  Patient presents with   Medical Management of Chronic Issues    HPI  History of Present Illness   Christopher Rasmussen is a 79 year old male with diabetes and hypertension who presents for routine follow-up and A1c check.  Glycemic control - Diabetes managed with metformin  and Ozempic . - Weight loss of approximately forty pounds over the last few years with Ozempic . - Maintaining current weight. - No new symptoms related to diabetes. - Annual eye exams completed.  Hypertension management - Hypertension managed with clonidine  patch as well as valsartan-hydrochlorothiazide , labetalol , and amlodipine .  - No dizziness or feeling faint after applying new clonidine  patch this morning. - Does not monitor blood pressure at home. - No new symptoms related to hypertension.  Immunization status - Completed flu, shingles, and pneumonia vaccinations. - No recent tetanus vaccination.         ROS Per HPI.    Objective:     BP 110/62   Pulse 79   Temp 98.2 F (36.8 C) (Oral)   Ht 5' 11 (1.803 m)   Wt 208 lb (94.3 kg)   SpO2 96%   BMI 29.01 kg/m    Physical Exam Constitutional:      General: He is not in acute distress.    Appearance: Normal appearance.  Cardiovascular:     Rate and Rhythm: Normal rate and regular rhythm.     Heart sounds: Murmur heard.     No friction rub. No gallop.  Pulmonary:     Effort: Pulmonary effort is normal. No respiratory distress.     Breath sounds: Normal breath sounds.  Abdominal:     General: Bowel sounds are normal.  Musculoskeletal:        General: No swelling.     Cervical back: Neck supple.  Lymphadenopathy:     Cervical: No cervical adenopathy.  Skin:    General: Skin is warm and dry.  Neurological:     General: No focal deficit present.     Mental Status: He is alert.   Psychiatric:        Mood and Affect: Mood normal.        Behavior: Behavior normal.        Thought Content: Thought content normal.      Results for orders placed or performed in visit on 08/16/24  POCT HgB A1C  Result Value Ref Range   Hemoglobin A1C 6.8 (A) 4.0 - 5.6 %   HbA1c POC (<> result, manual entry)     HbA1c, POC (prediabetic range)     HbA1c, POC (controlled diabetic range)      Last CBC Lab Results  Component Value Date   WBC 7.1 05/16/2024   HGB 14.9 05/16/2024   HCT 43.5 05/16/2024   MCV 91 05/16/2024   MCH 31.2 05/16/2024   RDW 13.2 05/16/2024   PLT 197 05/16/2024   Last metabolic panel Lab Results  Component Value Date   GLUCOSE 137 (H) 05/16/2024   NA 140 05/16/2024   K 4.2 05/16/2024   CL 101 05/16/2024   CO2 25 05/16/2024   BUN 15 05/16/2024   CREATININE 0.75 (L) 05/16/2024   EGFR 94 05/16/2024   CALCIUM  9.9 05/16/2024   PHOS 4.0 08/24/2014   PROT 6.8 05/16/2024   ALBUMIN 4.5 05/16/2024   LABGLOB 2.3  05/16/2024   AGRATIO 1.8 10/13/2022   BILITOT 1.0 05/16/2024   ALKPHOS 104 05/16/2024   AST 15 05/16/2024   ALT 19 05/16/2024   ANIONGAP 12 12/22/2014   Last lipids Lab Results  Component Value Date   CHOL 109 05/16/2024   HDL 35 (L) 05/16/2024   LDLCALC 59 05/16/2024   TRIG 73 05/16/2024   CHOLHDL 3.1 05/16/2024   Last hemoglobin A1c Lab Results  Component Value Date   HGBA1C 6.8 (A) 08/16/2024   Last thyroid functions Lab Results  Component Value Date   TSH 2.060 05/16/2024   Last vitamin D  Lab Results  Component Value Date   VD25OH 59.4 05/16/2024      The ASCVD Risk score (Arnett DK, et al., 2019) failed to calculate for the following reasons:   The valid total cholesterol range is 130 to 320 mg/dL    Assessment & Plan:   Type 2 diabetes mellitus with hyperglycemia, without long-term current use of insulin  (HCC) Assessment & Plan: A1c at goal of >7.0 at 6.8. Continue with metformin  and Ozempic . Foot exam UTD,  UACR UTD. A1c lab visit 3 months, follow-up with me in 6 months. Will get records request from his ophthalmologist, Dr. Regenia   Orders: -     POCT glycosylated hemoglobin (Hb A1C)  Hypertension associated with diabetes (HCC) Assessment & Plan: BP at goal of >130/80. Continue amlodipine , valsartan-HCTZ, labetalol  and clonidine  patch. Hypertension managed by his cardiologist, Dr. Ladona.  - Advised to monitor for sx of hypotension and notify us  if he experiences any of these symptoms. Patient verbalized understanding and was in agreement with the plan.    Hyperlipidemia associated with type 2 diabetes mellitus (HCC) Assessment & Plan: Last lipid panel: LDL 59, HDL 35, Trig 73. Continue atorvastatin  20 mg daily. LFTs within normal range.   Folliculitis Assessment & Plan: Resolved with Mupirocin  without recurrence.   Cardiac murmur Assessment & Plan: Harsh blowing murmur noted on exam. This has been addressed with cardiology and he is following with cardiology regularly.      Return in about 6 months (around 02/13/2025) for HTN, HLD, DM.    Saddie JULIANNA Sacks, PA-C "

## 2024-08-20 NOTE — Assessment & Plan Note (Signed)
 Resolved with Mupirocin  without recurrence.

## 2024-08-20 NOTE — Assessment & Plan Note (Signed)
 Last lipid panel: LDL 59, HDL 35, Trig 73. Continue atorvastatin  20 mg daily. LFTs within normal range.

## 2024-09-04 ENCOUNTER — Other Ambulatory Visit: Payer: Self-pay | Admitting: Cardiology

## 2024-09-04 DIAGNOSIS — E1159 Type 2 diabetes mellitus with other circulatory complications: Secondary | ICD-10-CM

## 2024-09-06 ENCOUNTER — Other Ambulatory Visit: Payer: Self-pay | Admitting: *Deleted

## 2024-09-06 DIAGNOSIS — I350 Nonrheumatic aortic (valve) stenosis: Secondary | ICD-10-CM

## 2024-09-09 ENCOUNTER — Other Ambulatory Visit (HOSPITAL_COMMUNITY): Payer: Medicare PPO

## 2024-09-11 ENCOUNTER — Other Ambulatory Visit: Payer: Self-pay | Admitting: Family Medicine

## 2024-09-11 ENCOUNTER — Ambulatory Visit: Admitting: Cardiology

## 2024-09-11 DIAGNOSIS — E1165 Type 2 diabetes mellitus with hyperglycemia: Secondary | ICD-10-CM

## 2024-10-10 ENCOUNTER — Ambulatory Visit (HOSPITAL_COMMUNITY)

## 2024-11-14 ENCOUNTER — Ambulatory Visit: Admitting: Cardiology

## 2025-01-16 ENCOUNTER — Ambulatory Visit

## 2025-02-06 ENCOUNTER — Other Ambulatory Visit

## 2025-02-13 ENCOUNTER — Ambulatory Visit
